# Patient Record
Sex: Male | Born: 1965 | Race: White | Hispanic: No | Marital: Married | Smoking: Never smoker
Health system: Southern US, Community
[De-identification: ages and names within clinical notes are randomized; demographics above are authoritative.]

## PROBLEM LIST (undated history)

## (undated) DIAGNOSIS — R531 Weakness: Secondary | ICD-10-CM

## (undated) DIAGNOSIS — R202 Paresthesia of skin: Secondary | ICD-10-CM

## (undated) DIAGNOSIS — E785 Hyperlipidemia, unspecified: Secondary | ICD-10-CM

## (undated) DIAGNOSIS — G500 Trigeminal neuralgia: Secondary | ICD-10-CM

## (undated) DIAGNOSIS — R002 Palpitations: Secondary | ICD-10-CM

## (undated) DIAGNOSIS — R519 Headache, unspecified: Secondary | ICD-10-CM

## (undated) DIAGNOSIS — I10 Essential (primary) hypertension: Secondary | ICD-10-CM

## (undated) DIAGNOSIS — R51 Headache: Secondary | ICD-10-CM

## (undated) DIAGNOSIS — R42 Dizziness and giddiness: Secondary | ICD-10-CM

## (undated) DIAGNOSIS — Z9989 Dependence on other enabling machines and devices: Secondary | ICD-10-CM

## (undated) DIAGNOSIS — Z9289 Personal history of other medical treatment: Secondary | ICD-10-CM

## (undated) DIAGNOSIS — G4733 Obstructive sleep apnea (adult) (pediatric): Secondary | ICD-10-CM

## (undated) DIAGNOSIS — E119 Type 2 diabetes mellitus without complications: Secondary | ICD-10-CM

## (undated) HISTORY — DX: Essential (primary) hypertension: I10

## (undated) HISTORY — DX: Weakness: R53.1

## (undated) HISTORY — DX: Paresthesia of skin: R20.2

## (undated) HISTORY — DX: Hyperlipidemia, unspecified: E78.5

## (undated) HISTORY — DX: Trigeminal neuralgia: G50.0

## (undated) HISTORY — DX: Headache: R51

## (undated) HISTORY — DX: Dizziness and giddiness: R42

## (undated) HISTORY — DX: Headache, unspecified: R51.9

## (undated) HISTORY — PX: NO PAST SURGERIES: SHX2092

---

## 2013-06-21 ENCOUNTER — Ambulatory Visit: Payer: BC Managed Care – PPO | Admitting: Neurology

## 2013-06-21 ENCOUNTER — Encounter: Payer: Self-pay | Admitting: Neurology

## 2013-06-21 DIAGNOSIS — R202 Paresthesia of skin: Secondary | ICD-10-CM | POA: Insufficient documentation

## 2013-06-21 DIAGNOSIS — R531 Weakness: Secondary | ICD-10-CM | POA: Insufficient documentation

## 2013-06-21 DIAGNOSIS — R42 Dizziness and giddiness: Secondary | ICD-10-CM | POA: Insufficient documentation

## 2013-06-21 DIAGNOSIS — G500 Trigeminal neuralgia: Secondary | ICD-10-CM | POA: Insufficient documentation

## 2015-10-13 DIAGNOSIS — G500 Trigeminal neuralgia: Secondary | ICD-10-CM | POA: Diagnosis not present

## 2015-10-13 DIAGNOSIS — I10 Essential (primary) hypertension: Secondary | ICD-10-CM | POA: Diagnosis not present

## 2015-10-13 DIAGNOSIS — R739 Hyperglycemia, unspecified: Secondary | ICD-10-CM | POA: Diagnosis not present

## 2015-10-13 DIAGNOSIS — K209 Esophagitis, unspecified: Secondary | ICD-10-CM | POA: Diagnosis not present

## 2016-02-12 DIAGNOSIS — Z23 Encounter for immunization: Secondary | ICD-10-CM | POA: Diagnosis not present

## 2016-02-18 DIAGNOSIS — G500 Trigeminal neuralgia: Secondary | ICD-10-CM | POA: Diagnosis not present

## 2016-02-18 DIAGNOSIS — Z87442 Personal history of urinary calculi: Secondary | ICD-10-CM | POA: Diagnosis not present

## 2016-02-18 DIAGNOSIS — N410 Acute prostatitis: Secondary | ICD-10-CM | POA: Diagnosis not present

## 2016-02-18 DIAGNOSIS — I10 Essential (primary) hypertension: Secondary | ICD-10-CM | POA: Diagnosis not present

## 2016-03-07 DIAGNOSIS — G4733 Obstructive sleep apnea (adult) (pediatric): Secondary | ICD-10-CM | POA: Diagnosis not present

## 2016-06-04 DIAGNOSIS — F17200 Nicotine dependence, unspecified, uncomplicated: Secondary | ICD-10-CM | POA: Diagnosis not present

## 2016-06-04 DIAGNOSIS — R0602 Shortness of breath: Secondary | ICD-10-CM | POA: Diagnosis not present

## 2016-06-04 DIAGNOSIS — Z7982 Long term (current) use of aspirin: Secondary | ICD-10-CM | POA: Diagnosis not present

## 2016-06-04 DIAGNOSIS — R05 Cough: Secondary | ICD-10-CM | POA: Diagnosis not present

## 2016-06-04 DIAGNOSIS — I10 Essential (primary) hypertension: Secondary | ICD-10-CM | POA: Diagnosis not present

## 2016-06-04 DIAGNOSIS — B349 Viral infection, unspecified: Secondary | ICD-10-CM | POA: Diagnosis not present

## 2016-07-04 DIAGNOSIS — G4733 Obstructive sleep apnea (adult) (pediatric): Secondary | ICD-10-CM | POA: Diagnosis not present

## 2016-08-05 DIAGNOSIS — Z0000 Encounter for general adult medical examination without abnormal findings: Secondary | ICD-10-CM | POA: Diagnosis not present

## 2016-10-26 DIAGNOSIS — M25512 Pain in left shoulder: Secondary | ICD-10-CM | POA: Diagnosis not present

## 2016-11-13 DIAGNOSIS — R000 Tachycardia, unspecified: Secondary | ICD-10-CM | POA: Diagnosis not present

## 2016-11-13 DIAGNOSIS — R0602 Shortness of breath: Secondary | ICD-10-CM | POA: Diagnosis not present

## 2016-11-13 DIAGNOSIS — E119 Type 2 diabetes mellitus without complications: Secondary | ICD-10-CM | POA: Diagnosis not present

## 2016-11-13 DIAGNOSIS — I10 Essential (primary) hypertension: Secondary | ICD-10-CM | POA: Diagnosis not present

## 2016-11-13 DIAGNOSIS — F419 Anxiety disorder, unspecified: Secondary | ICD-10-CM | POA: Diagnosis not present

## 2016-11-13 DIAGNOSIS — Z79899 Other long term (current) drug therapy: Secondary | ICD-10-CM | POA: Diagnosis not present

## 2016-11-13 DIAGNOSIS — R51 Headache: Secondary | ICD-10-CM | POA: Diagnosis not present

## 2016-11-13 DIAGNOSIS — Z87891 Personal history of nicotine dependence: Secondary | ICD-10-CM | POA: Diagnosis not present

## 2016-11-13 DIAGNOSIS — Z7982 Long term (current) use of aspirin: Secondary | ICD-10-CM | POA: Diagnosis not present

## 2016-12-06 DIAGNOSIS — G4733 Obstructive sleep apnea (adult) (pediatric): Secondary | ICD-10-CM | POA: Diagnosis not present

## 2017-02-13 DIAGNOSIS — Z23 Encounter for immunization: Secondary | ICD-10-CM | POA: Diagnosis not present

## 2017-03-06 DIAGNOSIS — L918 Other hypertrophic disorders of the skin: Secondary | ICD-10-CM | POA: Diagnosis not present

## 2017-03-06 DIAGNOSIS — D225 Melanocytic nevi of trunk: Secondary | ICD-10-CM | POA: Diagnosis not present

## 2017-03-06 DIAGNOSIS — L821 Other seborrheic keratosis: Secondary | ICD-10-CM | POA: Diagnosis not present

## 2017-03-27 DIAGNOSIS — G4733 Obstructive sleep apnea (adult) (pediatric): Secondary | ICD-10-CM | POA: Diagnosis not present

## 2017-03-29 ENCOUNTER — Ambulatory Visit (INDEPENDENT_AMBULATORY_CARE_PROVIDER_SITE_OTHER): Payer: BLUE CROSS/BLUE SHIELD | Admitting: Physician Assistant

## 2017-03-29 ENCOUNTER — Encounter: Payer: Self-pay | Admitting: Physician Assistant

## 2017-03-29 VITALS — BP 118/81 | HR 77 | Temp 97.8°F | Ht 73.0 in | Wt 311.8 lb

## 2017-03-29 DIAGNOSIS — E785 Hyperlipidemia, unspecified: Secondary | ICD-10-CM

## 2017-03-29 DIAGNOSIS — E1169 Type 2 diabetes mellitus with other specified complication: Secondary | ICD-10-CM | POA: Diagnosis not present

## 2017-03-29 DIAGNOSIS — Z0000 Encounter for general adult medical examination without abnormal findings: Secondary | ICD-10-CM

## 2017-03-29 DIAGNOSIS — G500 Trigeminal neuralgia: Secondary | ICD-10-CM | POA: Diagnosis not present

## 2017-03-29 DIAGNOSIS — Z87438 Personal history of other diseases of male genital organs: Secondary | ICD-10-CM | POA: Diagnosis not present

## 2017-03-29 DIAGNOSIS — I10 Essential (primary) hypertension: Secondary | ICD-10-CM

## 2017-03-29 DIAGNOSIS — K5730 Diverticulosis of large intestine without perforation or abscess without bleeding: Secondary | ICD-10-CM | POA: Diagnosis not present

## 2017-03-29 DIAGNOSIS — E119 Type 2 diabetes mellitus without complications: Secondary | ICD-10-CM | POA: Diagnosis not present

## 2017-03-29 DIAGNOSIS — E1159 Type 2 diabetes mellitus with other circulatory complications: Secondary | ICD-10-CM | POA: Diagnosis not present

## 2017-03-29 DIAGNOSIS — I152 Hypertension secondary to endocrine disorders: Secondary | ICD-10-CM | POA: Insufficient documentation

## 2017-03-29 MED ORDER — METFORMIN HCL 500 MG PO TABS: 500.0000 mg | ORAL_TABLET | Freq: Two times a day (BID) | ORAL | 5 refills | Status: DC

## 2017-03-29 MED ORDER — PANTOPRAZOLE SODIUM 40 MG PO TBEC: 40.0000 mg | DELAYED_RELEASE_TABLET | Freq: Every day | ORAL | 11 refills | Status: DC

## 2017-03-29 MED ORDER — OXCARBAZEPINE 600 MG PO TABS: 600.0000 mg | ORAL_TABLET | Freq: Three times a day (TID) | ORAL | 5 refills | Status: DC

## 2017-03-29 MED ORDER — TRAMADOL HCL 50 MG PO TABS: 50.0000 mg | ORAL_TABLET | Freq: Every day | ORAL | 1 refills | Status: DC

## 2017-03-29 MED ORDER — IBUPROFEN 800 MG PO TABS: 800.0000 mg | ORAL_TABLET | Freq: Every day | ORAL | 5 refills | Status: DC

## 2017-03-29 MED ORDER — ENALAPRIL MALEATE 20 MG PO TABS: 20.0000 mg | ORAL_TABLET | Freq: Every day | ORAL | 11 refills | Status: DC

## 2017-03-29 MED ORDER — EZETIMIBE 10 MG PO TABS: 10.0000 mg | ORAL_TABLET | Freq: Every day | ORAL | 11 refills | Status: DC

## 2017-03-29 MED ORDER — GABAPENTIN 300 MG PO CAPS: 300.0000 mg | ORAL_CAPSULE | Freq: Three times a day (TID) | ORAL | 11 refills | Status: DC

## 2017-03-29 NOTE — Progress Notes (Signed)
BP 118/81   Pulse 77   Temp 97.8 F (36.6 C) (Oral)   Ht 6\' 1"  (1.854 m)   Wt (!) 311 lb 12.8 oz (141.4 kg)   BMI 41.14 kg/m    Subjective:    Patient ID: Normal Gress, male    DOB: 08/06/1965, 51 y.o.   MRN: [id number]  HPI: Matthew Pennington is a 51 y.o. male presenting on 03/29/2017 for New Patient (Initial Visit) (Establish Care )  This patient comes in as a new patient to be established.  He has been seeing a PA in [place] with the last name of weight.  She is no longer at that practice.  He has family members that come here and wanted to establish here.  We reviewed all of his past medical history.  It is positive for trigeminal neuralgia, bursitis of the shoulder, recurrent back pain, history of prostatitis, diabetes, hyperlipidemia, diverticulosis, hypertension.  We will have labs drawn this week at his earliest convenience.  Orders are placed.  He reports having a colonoscopy approximately 5 years ago through a gastroenterologist in [place].  He reports that he was going to be able to wait 10 years.  Diverticulosis was found on that exam.  He is retired and used to work as a Midwife.  Over the years he has had lots of injuries.  His medications are reviewed and will be updated today.  Uses a limited amount of the tramadol.  He reports that the gabapentin does the best for his trigeminal neuralgia pain.  Relevant past medical, surgical, family and social history reviewed and updated as indicated. Allergies and medications reviewed and updated.  Past Medical History:  Diagnosis Date  . Headache   . Hyperlipidemia   . Hypertension   . Light headedness   . Tingling    toes  . Trigeminal neuralgia   . Trigeminal neuralgia   . Weakness     History reviewed. No pertinent surgical history.  Review of Systems  Constitutional: Negative.  Negative for appetite change, fatigue and fever.  HENT: Negative.   Eyes: Negative.  Negative for pain and visual disturbance.    Respiratory: Negative.  Negative for cough, chest tightness, shortness of breath and wheezing.   Cardiovascular: Negative.  Negative for chest pain, palpitations and leg swelling.  Gastrointestinal: Negative.  Negative for abdominal pain, diarrhea, nausea and vomiting.  Endocrine: Negative.   Genitourinary: Negative.   Musculoskeletal: Positive for arthralgias, back pain and myalgias.  Skin: Negative.  Negative for color change and rash.  Neurological: Positive for headaches. Negative for tremors, weakness and numbness.  Psychiatric/Behavioral: Negative.     Allergies as of 03/29/2017      Reactions   Morphine And Related       Medication List       Accurate as of 03/29/17  1:33 PM. Always use your most recent med list.          enalapril 20 MG tablet Commonly known as:  VASOTEC Take 1 tablet (20 mg total) by mouth daily.   ezetimibe 10 MG tablet Commonly known as:  ZETIA Take 1 tablet (10 mg total) by mouth daily.   gabapentin 300 MG capsule Commonly known as:  NEURONTIN Take 1 capsule (300 mg total) by mouth 3 (three) times daily.   ibuprofen 800 MG tablet Commonly known as:  ADVIL,MOTRIN Take 1 tablet (800 mg total) by mouth daily.   metFORMIN 500 MG tablet Commonly known as:  GLUCOPHAGE Take 1 tablet (  500 mg total) by mouth 2 (two) times daily.   oxcarbazepine 600 MG tablet Commonly known as:  TRILEPTAL Take 1 tablet (600 mg total) by mouth 3 (three) times daily.   pantoprazole 40 MG tablet Commonly known as:  PROTONIX Take 1 tablet (40 mg total) by mouth daily.   traMADol 50 MG tablet Commonly known as:  ULTRAM Take 1 tablet (50 mg total) by mouth daily.          Objective:    BP 118/81   Pulse 77   Temp 97.8 F (36.6 C) (Oral)   Ht 6\' 1"  (1.854 m)   Wt (!) 311 lb 12.8 oz (141.4 kg)   BMI 41.14 kg/m   Allergies  Allergen Reactions  . Morphine And Related     Physical Exam  Constitutional: He appears well-developed and well-nourished.  No distress.  HENT:  Head: Normocephalic and atraumatic.  Eyes: Pupils are equal, round, and reactive to light. Conjunctivae and EOM are normal.  Cardiovascular: Normal rate, regular rhythm and normal heart sounds.   Pulmonary/Chest: Effort normal and breath sounds normal. No respiratory distress.  Skin: Skin is warm and dry.  Psychiatric: He has a normal mood and affect. His behavior is normal.  Nursing note and vitals reviewed.   No results found for this or any previous visit.    Assessment & Plan:   1. Trigeminal neuralgia - traMADol (ULTRAM) 50 MG tablet; Take 1 tablet (50 mg total) by mouth daily.  Dispense: 60 tablet; Refill: 1 - oxcarbazepine (TRILEPTAL) 600 MG tablet; Take 1 tablet (600 mg total) by mouth 3 (three) times daily.  Dispense: 90 tablet; Refill: 5 - gabapentin (NEURONTIN) 300 MG capsule; Take 1 capsule (300 mg total) by mouth 3 (three) times daily.  Dispense: 30 capsule; Refill: 11 - ibuprofen (ADVIL,MOTRIN) 800 MG tablet; Take 1 tablet (800 mg total) by mouth daily.  Dispense: 90 tablet; Refill: 5  2. Hypertension associated with diabetes (HCC) - enalapril (VASOTEC) 20 MG tablet; Take 1 tablet (20 mg total) by mouth daily.  Dispense: 30 tablet; Refill: 11 - CBC with Differential/Platelet; Future  3. Diabetes mellitus without complication (HCC) - metFORMIN (GLUCOPHAGE) 500 MG tablet; Take 1 tablet (500 mg total) by mouth 2 (two) times daily.  Dispense: 60 tablet; Refill: 5 - CMP14+EGFR; Future - Bayer DCA Hb A1c Waived; Future  4. Hyperlipidemia associated with type 2 diabetes mellitus (HCC) - ezetimibe (ZETIA) 10 MG tablet; Take 1 tablet (10 mg total) by mouth daily.  Dispense: 30 tablet; Refill: 11 - CMP14+EGFR; Future - Lipid panel; Future  5. Well adult exam - CBC with Differential/Platelet; Future - CMP14+EGFR; Future - Lipid panel; Future - TSH; Future - PSA; Future  6. Diverticulosis of large intestine without hemorrhage  7. History of  prostatitis    Current Outpatient Prescriptions:  .  enalapril (VASOTEC) 20 MG tablet, Take 1 tablet (20 mg total) by mouth daily., Disp: 30 tablet, Rfl: 11 .  ezetimibe (ZETIA) 10 MG tablet, Take 1 tablet (10 mg total) by mouth daily., Disp: 30 tablet, Rfl: 11 .  gabapentin (NEURONTIN) 300 MG capsule, Take 1 capsule (300 mg total) by mouth 3 (three) times daily., Disp: 30 capsule, Rfl: 11 .  ibuprofen (ADVIL,MOTRIN) 800 MG tablet, Take 1 tablet (800 mg total) by mouth daily., Disp: 90 tablet, Rfl: 5 .  metFORMIN (GLUCOPHAGE) 500 MG tablet, Take 1 tablet (500 mg total) by mouth 2 (two) times daily., Disp: 60 tablet, Rfl: 5 .  oxcarbazepine (  TRILEPTAL) 600 MG tablet, Take 1 tablet (600 mg total) by mouth 3 (three) times daily., Disp: 90 tablet, Rfl: 5 .  pantoprazole (PROTONIX) 40 MG tablet, Take 1 tablet (40 mg total) by mouth daily., Disp: 30 tablet, Rfl: 11 .  traMADol (ULTRAM) 50 MG tablet, Take 1 tablet (50 mg total) by mouth daily., Disp: 60 tablet, Rfl: 1 Continue all other maintenance medications as listed above.  Follow up plan: Return in about 3 months (around 06/29/2017) for recheck.  Educational handout given for survey  Remus Loffler PA-C [facility] [address]  [place], [place] [address] [phone]   03/29/2017, 1:33 PM

## 2017-03-29 NOTE — Patient Instructions (Signed)

## 2017-03-31 ENCOUNTER — Other Ambulatory Visit: Payer: BLUE CROSS/BLUE SHIELD

## 2017-03-31 DIAGNOSIS — I152 Hypertension secondary to endocrine disorders: Secondary | ICD-10-CM

## 2017-03-31 DIAGNOSIS — E1159 Type 2 diabetes mellitus with other circulatory complications: Secondary | ICD-10-CM | POA: Diagnosis not present

## 2017-03-31 DIAGNOSIS — I10 Essential (primary) hypertension: Principal | ICD-10-CM

## 2017-03-31 DIAGNOSIS — Z0000 Encounter for general adult medical examination without abnormal findings: Secondary | ICD-10-CM

## 2017-03-31 DIAGNOSIS — E1169 Type 2 diabetes mellitus with other specified complication: Secondary | ICD-10-CM | POA: Diagnosis not present

## 2017-03-31 DIAGNOSIS — E785 Hyperlipidemia, unspecified: Secondary | ICD-10-CM

## 2017-03-31 DIAGNOSIS — E119 Type 2 diabetes mellitus without complications: Secondary | ICD-10-CM

## 2017-03-31 LAB — BAYER DCA HB A1C WAIVED: HB A1C (BAYER DCA - WAIVED): 8.7 % — ABNORMAL HIGH (ref <7.0)

## 2017-04-01 LAB — CMP14+EGFR
ALT: 78 IU/L — ABNORMAL HIGH (ref 0–44)
AST: 53 IU/L — ABNORMAL HIGH (ref 0–40)
Albumin/Globulin Ratio: 1.8 (ref 1.2–2.2)
Albumin: 4.4 g/dL (ref 3.5–5.5)
Alkaline Phosphatase: 65 IU/L (ref 39–117)
BUN/Creatinine Ratio: 11 (ref 9–20)
BUN: 9 mg/dL (ref 6–24)
Bilirubin Total: 0.3 mg/dL (ref 0.0–1.2)
CO2: 23 mmol/L (ref 20–29)
Calcium: 9.8 mg/dL (ref 8.7–10.2)
Chloride: 103 mmol/L (ref 96–106)
Creatinine, Ser: 0.82 mg/dL (ref 0.76–1.27)
GFR calc Af Amer: 118 mL/min/{1.73_m2} (ref >59)
GFR calc non Af Amer: 102 mL/min/{1.73_m2} (ref >59)
Globulin, Total: 2.4 g/dL (ref 1.5–4.5)
Glucose: 128 mg/dL — ABNORMAL HIGH (ref 65–99)
Potassium: 4.4 mmol/L (ref 3.5–5.2)
Sodium: 142 mmol/L (ref 134–144)
Total Protein: 6.8 g/dL (ref 6.0–8.5)

## 2017-04-01 LAB — CBC WITH DIFFERENTIAL/PLATELET
Basophils Absolute: 0 10*3/uL (ref 0.0–0.2)
Basos: 0 %
EOS (ABSOLUTE): 0.1 10*3/uL (ref 0.0–0.4)
Eos: 2 %
Hematocrit: 42.6 % (ref 37.5–51.0)
Hemoglobin: 14.5 g/dL (ref 13.0–17.7)
Immature Grans (Abs): 0 10*3/uL (ref 0.0–0.1)
Immature Granulocytes: 0 %
Lymphocytes Absolute: 2.8 10*3/uL (ref 0.7–3.1)
Lymphs: 43 %
MCH: 30.9 pg (ref 26.6–33.0)
MCHC: 34 g/dL (ref 31.5–35.7)
MCV: 91 fL (ref 79–97)
Monocytes Absolute: 0.5 10*3/uL (ref 0.1–0.9)
Monocytes: 7 %
Neutrophils Absolute: 3.1 10*3/uL (ref 1.4–7.0)
Neutrophils: 48 %
Platelets: 279 10*3/uL (ref 150–379)
RBC: 4.7 x10E6/uL (ref 4.14–5.80)
RDW: 14.2 % (ref 12.3–15.4)
WBC: 6.5 10*3/uL (ref 3.4–10.8)

## 2017-04-01 LAB — LIPID PANEL
Chol/HDL Ratio: 6.3 ratio — ABNORMAL HIGH (ref 0.0–5.0)
Cholesterol, Total: 202 mg/dL — ABNORMAL HIGH (ref 100–199)
HDL: 32 mg/dL — ABNORMAL LOW (ref >39)
LDL Calculated: 120 mg/dL — ABNORMAL HIGH (ref 0–99)
Triglycerides: 252 mg/dL — ABNORMAL HIGH (ref 0–149)
VLDL Cholesterol Cal: 50 mg/dL — ABNORMAL HIGH (ref 5–40)

## 2017-04-01 LAB — TSH: TSH: 1.59 u[IU]/mL (ref 0.450–4.500)

## 2017-04-01 LAB — PSA: Prostate Specific Ag, Serum: 0.4 ng/mL (ref 0.0–4.0)

## 2017-04-04 ENCOUNTER — Telehealth: Payer: Self-pay | Admitting: Physician Assistant

## 2017-04-04 ENCOUNTER — Other Ambulatory Visit: Payer: Self-pay | Admitting: *Deleted

## 2017-04-04 DIAGNOSIS — G500 Trigeminal neuralgia: Secondary | ICD-10-CM

## 2017-04-04 MED ORDER — GABAPENTIN 300 MG PO CAPS: 300.0000 mg | ORAL_CAPSULE | Freq: Three times a day (TID) | ORAL | 11 refills | Status: DC

## 2017-04-04 MED ORDER — DAPAGLIFLOZIN PROPANEDIOL 5 MG PO TABS: 5.0000 mg | ORAL_TABLET | Freq: Every day | ORAL | 5 refills | Status: DC

## 2017-04-04 NOTE — Telephone Encounter (Signed)
Corrected script sent

## 2017-04-04 NOTE — Telephone Encounter (Signed)
Pt aware.

## 2017-04-04 NOTE — Telephone Encounter (Signed)
Patient aware that script has been changed

## 2017-04-04 NOTE — Addendum Note (Signed)
Addended by: Terald Sleeper on: 04/04/2017 11:00 AM   Modules accepted: Orders

## 2017-04-04 NOTE — Telephone Encounter (Signed)
Patient called lab results and states that gabapentin prescription was only given for 10 days

## 2017-04-05 ENCOUNTER — Encounter: Payer: Self-pay | Admitting: Physician Assistant

## 2017-04-06 ENCOUNTER — Encounter: Payer: Self-pay | Admitting: Physician Assistant

## 2017-04-06 ENCOUNTER — Telehealth: Payer: Self-pay

## 2017-04-06 NOTE — Telephone Encounter (Signed)
Insurance denied [place]  Must try two alternatives which are Invok[employer] and [place]

## 2017-04-07 ENCOUNTER — Encounter: Payer: Self-pay | Admitting: Physician Assistant

## 2017-04-07 MED ORDER — EMPAGLIFLOZIN 25 MG PO TABS: 25.0000 mg | ORAL_TABLET | Freq: Every day | ORAL | 5 refills | Status: DC

## 2017-04-07 NOTE — Addendum Note (Signed)
Addended by: Terald Sleeper on: 04/07/2017 08:04 AM   Modules accepted: Orders

## 2017-04-14 ENCOUNTER — Encounter: Payer: Self-pay | Admitting: Physician Assistant

## 2017-04-14 DIAGNOSIS — I10 Essential (primary) hypertension: Principal | ICD-10-CM

## 2017-04-14 DIAGNOSIS — I152 Hypertension secondary to endocrine disorders: Secondary | ICD-10-CM

## 2017-04-14 DIAGNOSIS — E1159 Type 2 diabetes mellitus with other circulatory complications: Secondary | ICD-10-CM

## 2017-04-14 MED ORDER — ENALAPRIL MALEATE 20 MG PO TABS: 20.0000 mg | ORAL_TABLET | Freq: Two times a day (BID) | ORAL | 11 refills | Status: DC

## 2017-04-18 ENCOUNTER — Encounter: Payer: Self-pay | Admitting: Physician Assistant

## 2017-07-01 DIAGNOSIS — Z7982 Long term (current) use of aspirin: Secondary | ICD-10-CM | POA: Diagnosis not present

## 2017-07-01 DIAGNOSIS — I10 Essential (primary) hypertension: Secondary | ICD-10-CM | POA: Diagnosis not present

## 2017-07-01 DIAGNOSIS — R0602 Shortness of breath: Secondary | ICD-10-CM | POA: Diagnosis not present

## 2017-07-01 DIAGNOSIS — Z79899 Other long term (current) drug therapy: Secondary | ICD-10-CM | POA: Diagnosis not present

## 2017-07-01 DIAGNOSIS — G4730 Sleep apnea, unspecified: Secondary | ICD-10-CM | POA: Diagnosis not present

## 2017-07-01 DIAGNOSIS — Z87891 Personal history of nicotine dependence: Secondary | ICD-10-CM | POA: Diagnosis not present

## 2017-07-01 DIAGNOSIS — Z7984 Long term (current) use of oral hypoglycemic drugs: Secondary | ICD-10-CM | POA: Diagnosis not present

## 2017-07-01 DIAGNOSIS — R002 Palpitations: Secondary | ICD-10-CM | POA: Diagnosis not present

## 2017-07-03 ENCOUNTER — Ambulatory Visit: Payer: BLUE CROSS/BLUE SHIELD | Admitting: Physician Assistant

## 2017-07-03 ENCOUNTER — Encounter: Payer: Self-pay | Admitting: Physician Assistant

## 2017-07-03 VITALS — BP 113/75 | HR 71 | Temp 97.1°F | Ht 73.0 in | Wt 308.0 lb

## 2017-07-03 DIAGNOSIS — R002 Palpitations: Secondary | ICD-10-CM

## 2017-07-03 DIAGNOSIS — E119 Type 2 diabetes mellitus without complications: Secondary | ICD-10-CM

## 2017-07-03 DIAGNOSIS — G500 Trigeminal neuralgia: Secondary | ICD-10-CM | POA: Diagnosis not present

## 2017-07-03 DIAGNOSIS — M21961 Unspecified acquired deformity of right lower leg: Secondary | ICD-10-CM

## 2017-07-03 DIAGNOSIS — M21962 Unspecified acquired deformity of left lower leg: Secondary | ICD-10-CM

## 2017-07-03 DIAGNOSIS — E1140 Type 2 diabetes mellitus with diabetic neuropathy, unspecified: Secondary | ICD-10-CM | POA: Diagnosis not present

## 2017-07-03 LAB — LIPID PANEL
Chol/HDL Ratio: 5.5 ratio — ABNORMAL HIGH (ref 0.0–5.0)
Cholesterol, Total: 193 mg/dL (ref 100–199)
HDL: 35 mg/dL — ABNORMAL LOW (ref >39)
LDL Calculated: 124 mg/dL — ABNORMAL HIGH (ref 0–99)
Triglycerides: 171 mg/dL — ABNORMAL HIGH (ref 0–149)
VLDL Cholesterol Cal: 34 mg/dL (ref 5–40)

## 2017-07-03 LAB — BAYER DCA HB A1C WAIVED: HB A1C (BAYER DCA - WAIVED): 6.8 % (ref <7.0)

## 2017-07-03 MED ORDER — EMPAGLIFLOZIN 25 MG PO TABS: 25.0000 mg | ORAL_TABLET | Freq: Every day | ORAL | 5 refills | Status: DC

## 2017-07-03 MED ORDER — TRAMADOL HCL 50 MG PO TABS: 50.0000 mg | ORAL_TABLET | Freq: Every day | ORAL | 1 refills | Status: DC

## 2017-07-03 MED ORDER — METFORMIN HCL 500 MG PO TABS: 500.0000 mg | ORAL_TABLET | Freq: Two times a day (BID) | ORAL | 5 refills | Status: DC

## 2017-07-03 MED ORDER — IBUPROFEN 800 MG PO TABS: 800.0000 mg | ORAL_TABLET | Freq: Every day | ORAL | 5 refills | Status: DC

## 2017-07-03 MED ORDER — OXCARBAZEPINE 600 MG PO TABS: 600.0000 mg | ORAL_TABLET | Freq: Three times a day (TID) | ORAL | 5 refills | Status: DC

## 2017-07-03 NOTE — Patient Instructions (Signed)
In a few days you may receive a survey in the mail or online from [employer] regarding your visit with us today. Please take a moment to fill this out. Your feedback is very important to our whole office. It can help us better understand your needs as well as improve your experience and satisfaction. Thank you for taking your time to complete it. We care about you.  Calyse Murcia, PA-C  

## 2017-07-03 NOTE — Progress Notes (Signed)
BP 113/75   Pulse 71   Temp (!) 97.1 F (36.2 C) (Oral)   Ht 6\' 1"  (1.854 m)   Wt (!) 308 lb (139.7 kg)   BMI 40.64 kg/m    Subjective:    Patient ID: Matthew Pennington, male    DOB: Oct 16, 1965, 52 y.o.   MRN: [id number]  HPI: Matthew Pennington is a 52 y.o. male presenting on 07/03/2017 for Follow-up (3 month ); Referral (for Diabetic eye exam ); Foot Pain (bilateral pain and tingling ); and Palpitations (went to ER Saturday for palpitations- [facility] )  .  Palpitations have become more frequent.  He did go to the emergency room on Saturday.  He has not had a cardiac workup in some time.  He does have some family history of cardiac disease and he is a diabetic and self.  We will plan for cardiac evaluation  His fasting blood sugars have ranged anywhere from the low 100s-140.  He has been having very good control of that.  We did discuss the possibility of the anxiety also causing the palpitations.  I think at this point it sounds as though he is having palpitations first and then gets anxious.  He is also here for face-to-face for diabetic shoes.  He has no known deformity no deformity on the right posterior heel.  He has decreased sensation in the second third and fourth toes on the left and the right.  He has thickened nails throughout.  He also has a history of ingrown nails.  Relevant past medical, surgical, family and social history reviewed and updated as indicated. Allergies and medications reviewed and updated.  Past Medical History:  Diagnosis Date  . Headache   . Hyperlipidemia   . Hypertension   . Light headedness   . Tingling    toes  . Trigeminal neuralgia   . Trigeminal neuralgia   . Weakness     History reviewed. No pertinent surgical history.  Review of Systems  Constitutional: Negative.  Negative for appetite change and fatigue.  HENT: Negative.   Eyes: Negative.  Negative for pain and visual disturbance.  Respiratory: Negative.  Negative for cough, chest  tightness, shortness of breath and wheezing.   Cardiovascular: Negative.  Negative for chest pain, palpitations and leg swelling.  Gastrointestinal: Negative.  Negative for abdominal pain, diarrhea, nausea and vomiting.  Endocrine: Negative.   Genitourinary: Negative.   Musculoskeletal: Negative.   Skin: Negative.  Negative for color change and rash.  Neurological: Negative.  Negative for weakness, numbness and headaches.  Psychiatric/Behavioral: Negative.     Allergies as of 07/03/2017      Reactions   Morphine And Related       Medication List        Accurate as of 07/03/17  2:44 PM. Always use your most recent med list.          empagliflozin 25 MG Tabs tablet Commonly known as:  JARDIANCE Take 25 mg by mouth daily.   enalapril 20 MG tablet Commonly known as:  VASOTEC Take 1 tablet (20 mg total) 2 (two) times daily by mouth.   ezetimibe 10 MG tablet Commonly known as:  ZETIA Take 1 tablet (10 mg total) by mouth daily.   gabapentin 300 MG capsule Commonly known as:  NEURONTIN Take 1 capsule (300 mg total) 3 (three) times daily by mouth.   ibuprofen 800 MG tablet Commonly known as:  ADVIL,MOTRIN Take 1 tablet (800 mg total) by mouth daily.  metFORMIN 500 MG tablet Commonly known as:  GLUCOPHAGE Take 1 tablet (500 mg total) by mouth 2 (two) times daily.   oxcarbazepine 600 MG tablet Commonly known as:  TRILEPTAL Take 1 tablet (600 mg total) by mouth 3 (three) times daily.   pantoprazole 40 MG tablet Commonly known as:  PROTONIX Take 1 tablet (40 mg total) by mouth daily.   traMADol 50 MG tablet Commonly known as:  ULTRAM Take 1 tablet (50 mg total) by mouth daily.            Durable Medical Equipment  (From admission, onward)        Start     Ordered   07/03/17 0000  DME Other see comment    Comments:  Please fit for diabetic shoes Diagnosis code E 11.40   07/03/17 1353         Objective:    BP 113/75   Pulse 71   Temp (!) 97.1 F (36.2  C) (Oral)   Ht 6\' 1"  (1.854 m)   Wt (!) 308 lb (139.7 kg)   BMI 40.64 kg/m   Allergies  Allergen Reactions  . Morphine And Related     Physical Exam  Constitutional: He appears well-developed and well-nourished.  HENT:  Head: Normocephalic and atraumatic.  Eyes: Conjunctivae and EOM are normal. Pupils are equal, round, and reactive to light.  Neck: Normal range of motion. Neck supple.  Cardiovascular: Normal rate, regular rhythm and normal heart sounds.  Pulmonary/Chest: Effort normal and breath sounds normal.  Abdominal: Soft. Bowel sounds are normal.  Musculoskeletal: Normal range of motion.  Skin: Skin is warm and dry.   Diabetic Foot Exam - Simple   Simple Foot Form Diabetic Foot exam was performed with the following findings:  Yes 07/03/2017  2:44 PM  Visual Inspection Sensation Testing Pulse Check Comments   Diabetic Foot Exam - detailed  Date & Time: 07/03/2017 2:44 PM Diabetic Foot exam was performed with the following findings: Yes  Visual Foot Exam completed.: Yes  Is there a history of foot ulcer?: No  Is there a foot ulcer now?: No  Is there swelling?: Yes  Is there elevated skin temperature?: No  Is there abnormal foot shape?: Yes  Is there a claw toe deformity?: No  Are the toenails long?: No  Are the toenails thick?: Yes  Are the toenails ingrown?: Yes  Is the skin thin, fragile, shiny and hairless?": No  Normal Range of Motion?: Yes  Is there foot or ankle muscle weakness?: No  Do you have pain in calf while walking?: No  Are the shoes appropriate in style and fit?: Yes  Can the patient see the bottom of their feet?: Yes  Right Posterior Tibialis: Diminished Left posterior Tibialis: Diminished  Right Dorsalis Pedis: Diminished Left Dorsalis Pedis: Diminished     Semmes-Weinstein Monofilament Test  "+" means "has sensation" and "-" means "no sensation"  R Foot Test Control: Pos L Foot Test Control: Pos  R Site 1-Great Toe: Pos L Site 1-Great Toe: Pos      Results for orders placed or performed in visit on 07/03/17  Bayer DCA Hb A1c Waived  Result Value Ref Range   Bayer DCA Hb A1c Waived 6.8 <7.0 %      Assessment & Plan:   1. Palpitations - Ambulatory referral to Cardiology  2. Trigeminal neuralgia - oxcarbazepine (TRILEPTAL) 600 MG tablet; Take 1 tablet (600 mg total) by mouth 3 (three) times daily.  Dispense: 90  tablet; Refill: 5 - ibuprofen (ADVIL,MOTRIN) 800 MG tablet; Take 1 tablet (800 mg total) by mouth daily.  Dispense: 90 tablet; Refill: 5 - traMADol (ULTRAM) 50 MG tablet; Take 1 tablet (50 mg total) by mouth daily.  Dispense: 60 tablet; Refill: 1  3. Diabetes mellitus without complication (HCC) - Bayer DCA Hb A1c Waived - metFORMIN (GLUCOPHAGE) 500 MG tablet; Take 1 tablet (500 mg total) by mouth 2 (two) times daily.  Dispense: 60 tablet; Refill: 5 - Lipid panel  4. Type 2 diabetes mellitus with diabetic neuropathy, without long-term current use of insulin (HCC) - Ambulatory referral to Ophthalmology - DME Other see comment  5. Foot deformity, bilateral - DME Other see comment    Current Outpatient Medications:  .  empagliflozin (JARDIANCE) 25 MG TABS tablet, Take 25 mg by mouth daily., Disp: 30 tablet, Rfl: 5 .  enalapril (VASOTEC) 20 MG tablet, Take 1 tablet (20 mg total) 2 (two) times daily by mouth., Disp: 60 tablet, Rfl: 11 .  ezetimibe (ZETIA) 10 MG tablet, Take 1 tablet (10 mg total) by mouth daily., Disp: 30 tablet, Rfl: 11 .  gabapentin (NEURONTIN) 300 MG capsule, Take 1 capsule (300 mg total) 3 (three) times daily by mouth., Disp: 90 capsule, Rfl: 11 .  ibuprofen (ADVIL,MOTRIN) 800 MG tablet, Take 1 tablet (800 mg total) by mouth daily., Disp: 90 tablet, Rfl: 5 .  metFORMIN (GLUCOPHAGE) 500 MG tablet, Take 1 tablet (500 mg total) by mouth 2 (two) times daily., Disp: 60 tablet, Rfl: 5 .  oxcarbazepine (TRILEPTAL) 600 MG tablet, Take 1 tablet (600 mg total) by mouth 3 (three) times daily., Disp: 90  tablet, Rfl: 5 .  pantoprazole (PROTONIX) 40 MG tablet, Take 1 tablet (40 mg total) by mouth daily., Disp: 30 tablet, Rfl: 11 .  traMADol (ULTRAM) 50 MG tablet, Take 1 tablet (50 mg total) by mouth daily., Disp: 60 tablet, Rfl: 1 Continue all other maintenance medications as listed above.  Follow up plan: Return in about 6 months (around 12/31/2017) for re check.  Educational handout given for survey   Remus Loffler PA-C [facility] [address]  [place], [place] [address] [phone]   07/03/2017, 2:44 PM

## 2017-07-14 DIAGNOSIS — E119 Type 2 diabetes mellitus without complications: Secondary | ICD-10-CM | POA: Diagnosis not present

## 2017-07-14 DIAGNOSIS — H3581 Retinal edema: Secondary | ICD-10-CM | POA: Diagnosis not present

## 2017-07-14 DIAGNOSIS — H2513 Age-related nuclear cataract, bilateral: Secondary | ICD-10-CM | POA: Diagnosis not present

## 2017-07-20 ENCOUNTER — Ambulatory Visit: Payer: Self-pay | Admitting: Cardiovascular Disease

## 2017-07-25 ENCOUNTER — Other Ambulatory Visit: Payer: Self-pay | Admitting: Physician Assistant

## 2017-08-07 ENCOUNTER — Ambulatory Visit: Payer: Self-pay | Admitting: Cardiovascular Disease

## 2017-08-08 DIAGNOSIS — G4733 Obstructive sleep apnea (adult) (pediatric): Secondary | ICD-10-CM | POA: Diagnosis not present

## 2017-08-25 ENCOUNTER — Encounter: Payer: Self-pay | Admitting: *Deleted

## 2017-08-28 ENCOUNTER — Telehealth: Payer: Self-pay | Admitting: Cardiovascular Disease

## 2017-08-28 ENCOUNTER — Encounter: Payer: Self-pay | Admitting: *Deleted

## 2017-08-28 ENCOUNTER — Encounter: Payer: Self-pay | Admitting: Cardiovascular Disease

## 2017-08-28 ENCOUNTER — Ambulatory Visit: Payer: BLUE CROSS/BLUE SHIELD | Admitting: Cardiovascular Disease

## 2017-08-28 VITALS — BP 120/82 | HR 68 | Ht 73.0 in | Wt 307.4 lb

## 2017-08-28 DIAGNOSIS — E782 Mixed hyperlipidemia: Secondary | ICD-10-CM | POA: Diagnosis not present

## 2017-08-28 DIAGNOSIS — R002 Palpitations: Secondary | ICD-10-CM

## 2017-08-28 DIAGNOSIS — E119 Type 2 diabetes mellitus without complications: Secondary | ICD-10-CM

## 2017-08-28 DIAGNOSIS — I10 Essential (primary) hypertension: Secondary | ICD-10-CM | POA: Diagnosis not present

## 2017-08-28 NOTE — Patient Instructions (Signed)
Medication Instructions:  Your physician recommends that you continue on your current medications as directed. Please refer to the Current Medication list given to you today.  Labwork: NONE  Testing/Procedures: Your physician has requested that you have an echocardiogram. Echocardiography is a painless test that uses sound waves to create images of your heart. It provides your doctor with information about the size and shape of your heart and how well your heart's chambers and valves are working. This procedure takes approximately one hour. There are no restrictions for this procedure.  Your physician has recommended that you wear an event monitor FOR 30 DAYS. Event monitors are medical devices that record the heart's electrical activity. Doctors most often [place] these monitors to diagnose arrhythmias. Arrhythmias are problems with the speed or rhythm of the heartbeat. The monitor is a small, portable device. You can wear one while you do your normal daily activities. This is usually used to diagnose what is causing palpitations/syncope (passing out).   Follow-Up: Your physician recommends that you schedule a follow-up appointment in: [address]   Any Other Special Instructions Will Be Listed Below (If Applicable).  If you need a refill on your cardiac medications before your next appointment, please call your pharmacy.

## 2017-08-28 NOTE — Progress Notes (Signed)
CARDIOLOGY CONSULT NOTE  Patient ID: Matthew Pennington MRN: [id number] DOB/AGE: 07/07/1965 52 y.o.  Admit date: (Not on file) Primary Physician: Remus Loffler, PA-C Referring Physician: Remus Loffler, PA-C  Reason for Consultation: Palpitations  HPI: Matthew Pennington is a 52 y.o. male who is being seen today for the evaluation of palpitations at the request of Remus Loffler, PA-C.   Past medical history includes type 2 diabetes mellitus, mixed dyslipidemia and hypertension.  I reviewed lipid panel performed 07/03/17 and 03/31/17.  The more recent lipid panel shows total cholesterol 193, triglycerides 171, HDL 35, LDL 124.  TSH normal at 1.59 on 03/31/17.  It appears he was recently evaluated for palpitations in the ED at [facility]  I do not have these records.  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  More recently in the past year, he has been experiencing episodes of a warm sensation all over his body while driving followed by his heart racing.  He said he unnecessarily feels excited and jittery at times.  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  He is able to walk with his wife in his neighborhood without exertional chest pain or dyspnea.  He denies leg swelling, orthopnea, and paroxysmal nocturnal dyspnea.  He has sleep apnea and uses CPAP.  Family history: Mother developed CHF in her 68s.  He has a sister who underwent valve replacement surgery and had rheumatic fever as a child.    Allergies  Allergen Reactions  . Morphine And Related Other (See Comments)    "Makes me go Crazy"    Current Outpatient Medications  Medication Sig Dispense Refill  . empagliflozin (JARDIANCE) 25 MG TABS tablet Take 25 mg by mouth daily. 30 tablet 5  . enalapril (VASOTEC) 20 MG tablet Take 1 tablet (20 mg total) 2 (two) times daily by  mouth. 60 tablet 11  . ezetimibe (ZETIA) 10 MG tablet Take 1 tablet (10 mg total) by mouth daily. 30 tablet 11  . gabapentin (NEURONTIN) 300 MG capsule Take 1 capsule (300 mg total) 3 (three) times daily by mouth. 90 capsule 11  . ibuprofen (ADVIL,MOTRIN) 800 MG tablet Take 1 tablet (800 mg total) by mouth daily. 90 tablet 5  . metFORMIN (GLUCOPHAGE) 500 MG tablet Take 1 tablet (500 mg total) by mouth 2 (two) times daily. 60 tablet 5  . oxcarbazepine (TRILEPTAL) 600 MG tablet Take 1 tablet (600 mg total) by mouth 3 (three) times daily. 90 tablet 5  . pantoprazole (PROTONIX) 40 MG tablet Take 1 tablet (40 mg total) by mouth daily. 30 tablet 11  . traMADol (ULTRAM) 50 MG tablet Take 1 tablet (50 mg total) by mouth daily. 60 tablet 1   No current facility-administered medications for this visit.     Past Medical History:  Diagnosis Date  . Headache   . Hyperlipidemia   . Hypertension   . Light headedness   . Tingling    toes  . Trigeminal neuralgia   . Trigeminal neuralgia   . Weakness     Past Surgical History:  Procedure Laterality Date  . NO PAST SURGERIES      Social History   Socioeconomic History  . Marital status: Married    Spouse name: Not on file  . Number of  children: Not on file  . Years of education: Not on file  . Highest education level: Not on file  Occupational History  . Not on file  Social Needs  . Financial resource strain: Not on file  . Food insecurity:    Worry: Not on file    Inability: Not on file  . Transportation needs:    Medical: Not on file    Non-medical: Not on file  Tobacco Use  . Smoking status: Never Smoker  . Smokeless tobacco: Never Used  Substance and Sexual Activity  . Alcohol use: No  . Drug use: No  . Sexual activity: Not on file  Lifestyle  . Physical activity:    Days per week: Not on file    Minutes per session: Not on file  . Stress: Not on file  Relationships  . Social connections:    Talks on phone: Not on file      Gets together: Not on file    Attends religious service: Not on file    Active member of club or organization: Not on file    Attends meetings of clubs or organizations: Not on file    Relationship status: Not on file  . Intimate partner violence:    Fear of current or ex partner: Not on file    Emotionally abused: Not on file    Physically abused: Not on file    Forced sexual activity: Not on file  Other Topics Concern  . Not on file  Social History Narrative   Patient works full time as a Midwife.     Current Meds  Medication Sig  . empagliflozin (JARDIANCE) 25 MG TABS tablet Take 25 mg by mouth daily.  . enalapril (VASOTEC) 20 MG tablet Take 1 tablet (20 mg total) 2 (two) times daily by mouth.  . ezetimibe (ZETIA) 10 MG tablet Take 1 tablet (10 mg total) by mouth daily.  Marland Kitchen gabapentin (NEURONTIN) 300 MG capsule Take 1 capsule (300 mg total) 3 (three) times daily by mouth.  Marland Kitchen ibuprofen (ADVIL,MOTRIN) 800 MG tablet Take 1 tablet (800 mg total) by mouth daily.  . metFORMIN (GLUCOPHAGE) 500 MG tablet Take 1 tablet (500 mg total) by mouth 2 (two) times daily.  Marland Kitchen oxcarbazepine (TRILEPTAL) 600 MG tablet Take 1 tablet (600 mg total) by mouth 3 (three) times daily.  . pantoprazole (PROTONIX) 40 MG tablet Take 1 tablet (40 mg total) by mouth daily.  . traMADol (ULTRAM) 50 MG tablet Take 1 tablet (50 mg total) by mouth daily.      Review of systems complete and found to be negative unless listed above in HPI    Physical exam Blood pressure 120/82, pulse 68, height 6\' 1"  (1.854 m), weight (!) 307 lb 6.4 oz (139.4 kg), SpO2 95 %. General: NAD Neck: No JVD, no thyromegaly or thyroid nodule.  Lungs: Clear to auscultation bilaterally with normal respiratory effort. CV: Nondisplaced PMI. Regular rate and rhythm, normal S1/S2, no S3/S4, no murmur.  No peripheral edema.  No carotid bruit.  Abdomen: Soft, nontender, no distention.  Skin: Intact without lesions or rashes.   Neurologic: Alert and oriented x 3.  Psych: Normal affect. Extremities: No clubbing or cyanosis.  HEENT: Normal.   ECG: Most recent ECG reviewed.   Labs: Lab Results  Component Value Date/Time   K 4.4 03/31/2017 11:08 AM   BUN 9 03/31/2017 11:08 AM   CREATININE 0.82 03/31/2017 11:08 AM   ALT 78 (H) 03/31/2017 11:08 AM  TSH 1.590 03/31/2017 11:08 AM   HGB 14.5 03/31/2017 11:08 AM     Lipids: Lab Results  Component Value Date/Time   LDLCALC 124 (H) 07/03/2017 10:26 AM   CHOL 193 07/03/2017 10:26 AM   TRIG 171 (H) 07/03/2017 10:26 AM   HDL 35 (L) 07/03/2017 10:26 AM        ASSESSMENT AND PLAN:   1.  Palpitations: It is unclear to me if symptoms are related to an arrhythmia versus an anxiety/panic disorder.  I will obtain records from [facility] including ECGs.  I will obtain an echocardiogram to evaluate cardiac structure and function.  I will obtain a 30-day event monitor as well.  2.  Hypertension: Controlled on present therapy.  No changes.  3.  Type 2 diabetes mellitus: Currently on Jardiance and metformin.  4.  Mixed dyslipidemia: Lipids reviewed above.  Currently on Zetia 10 mg.  Statin therapy is indicated given concomitant type 2 diabetes mellitus.  I would recommend starting Crestor 10 mg.    Disposition: Follow up in 2 months  Signed: Prentice Docker, M.D., F.A.C.C.  08/28/2017, 9:30 AM

## 2017-08-28 NOTE — Telephone Encounter (Signed)
Echo [facility] September 19, 2017

## 2017-08-31 ENCOUNTER — Ambulatory Visit (INDEPENDENT_AMBULATORY_CARE_PROVIDER_SITE_OTHER): Payer: BLUE CROSS/BLUE SHIELD

## 2017-08-31 DIAGNOSIS — R002 Palpitations: Secondary | ICD-10-CM | POA: Diagnosis not present

## 2017-09-05 ENCOUNTER — Encounter: Payer: Self-pay | Admitting: *Deleted

## 2017-09-16 ENCOUNTER — Encounter: Payer: Self-pay | Admitting: Physician Assistant

## 2017-09-20 ENCOUNTER — Other Ambulatory Visit: Payer: Self-pay | Admitting: Cardiovascular Disease

## 2017-09-20 ENCOUNTER — Other Ambulatory Visit: Payer: BLUE CROSS/BLUE SHIELD | Admitting: Cardiovascular Disease

## 2017-09-20 DIAGNOSIS — R002 Palpitations: Secondary | ICD-10-CM

## 2017-09-20 DIAGNOSIS — R0602 Shortness of breath: Secondary | ICD-10-CM

## 2017-09-27 DIAGNOSIS — Z9289 Personal history of other medical treatment: Secondary | ICD-10-CM

## 2017-09-27 HISTORY — DX: Personal history of other medical treatment: Z92.89

## 2017-10-11 ENCOUNTER — Telehealth: Payer: Self-pay | Admitting: *Deleted

## 2017-10-11 ENCOUNTER — Other Ambulatory Visit: Payer: Self-pay

## 2017-10-11 ENCOUNTER — Ambulatory Visit (INDEPENDENT_AMBULATORY_CARE_PROVIDER_SITE_OTHER): Payer: BLUE CROSS/BLUE SHIELD

## 2017-10-11 DIAGNOSIS — R0602 Shortness of breath: Secondary | ICD-10-CM

## 2017-10-11 DIAGNOSIS — R002 Palpitations: Secondary | ICD-10-CM

## 2017-10-11 NOTE — Telephone Encounter (Signed)
Notes recorded by Laurine Blazer, LPN on 0/37/0488 at 8:91 PM EDT Patient notified. Copy to pmd. Follow up scheduled for 11/02/2017 with Dr. Bronson Ing.    ------  Notes recorded by Herminio Commons, MD on 10/11/2017 at 10:25 AM EDT Normal cardiac function.

## 2017-10-19 DIAGNOSIS — G4733 Obstructive sleep apnea (adult) (pediatric): Secondary | ICD-10-CM | POA: Diagnosis not present

## 2017-11-02 ENCOUNTER — Telehealth: Payer: Self-pay | Admitting: *Deleted

## 2017-11-02 ENCOUNTER — Ambulatory Visit: Payer: BLUE CROSS/BLUE SHIELD | Admitting: Cardiovascular Disease

## 2017-11-02 NOTE — Telephone Encounter (Signed)
Patient questioned if caffeine could make his heart rate be elevated.  Explained to patient that definitely caffeine can, along with stress, exercise, sugar, chocolate.  Stated that he does drink a lot of coffee and soda and states the he definitely will try cutting back on that.  Suggest he could keep log of readings and bring with him to next OV.  If heart rate continue to trend upward before next OV he is to contact the office.  Next OV scheduled for August.

## 2017-11-02 NOTE — Telephone Encounter (Signed)
Notes recorded by Laurine Blazer, LPN on 09/27/8341 at 7:35 AM EDT Patient notified. Copy to pmd. Follow up scheduled for August with Dr. Bronson Ing. ------  Notes recorded by Laurine Blazer, LPN on 7/89/7847 at 8:41 PM EDT Left message to return call.  ------  Notes recorded by Herminio Commons, MD on 10/27/2017 at 12:33 PM EDT Sinus rhythm and sinus tachycardia seen. Symptoms correlated with sinus tachycardia, 114 bpm

## 2017-11-16 ENCOUNTER — Other Ambulatory Visit: Payer: Self-pay | Admitting: Physician Assistant

## 2017-11-16 ENCOUNTER — Encounter: Payer: Self-pay | Admitting: Physician Assistant

## 2017-11-16 NOTE — Telephone Encounter (Signed)
Last office visit February 4,2019. Please advise on refill.

## 2017-11-17 ENCOUNTER — Other Ambulatory Visit: Payer: Self-pay | Admitting: Physician Assistant

## 2017-11-17 DIAGNOSIS — E119 Type 2 diabetes mellitus without complications: Secondary | ICD-10-CM

## 2017-11-17 MED ORDER — EMPAGLIFLOZIN 25 MG PO TABS: 25.0000 mg | ORAL_TABLET | Freq: Every day | ORAL | 5 refills | Status: DC

## 2017-11-17 MED ORDER — METFORMIN HCL 500 MG PO TABS: 500.0000 mg | ORAL_TABLET | Freq: Two times a day (BID) | ORAL | 5 refills | Status: DC

## 2017-11-17 NOTE — Telephone Encounter (Signed)
I sent medicaition and can be seen in the next couple of months

## 2017-11-17 NOTE — Telephone Encounter (Signed)
Patient aware.

## 2017-11-22 ENCOUNTER — Ambulatory Visit: Payer: BLUE CROSS/BLUE SHIELD | Admitting: Physician Assistant

## 2017-11-22 ENCOUNTER — Encounter: Payer: Self-pay | Admitting: Physician Assistant

## 2017-11-22 VITALS — BP 120/80 | HR 82 | Temp 96.6°F | Ht 73.0 in | Wt 303.6 lb

## 2017-11-22 DIAGNOSIS — G4762 Sleep related leg cramps: Secondary | ICD-10-CM

## 2017-11-22 DIAGNOSIS — F411 Generalized anxiety disorder: Secondary | ICD-10-CM | POA: Diagnosis not present

## 2017-11-22 MED ORDER — DULOXETINE HCL 30 MG PO CPEP: 30.0000 mg | ORAL_CAPSULE | Freq: Every day | ORAL | 3 refills | Status: DC

## 2017-11-22 MED ORDER — ROPINIROLE HCL 0.25 MG PO TABS: 0.2500 mg | ORAL_TABLET | Freq: Every day | ORAL | 2 refills | Status: DC

## 2017-11-22 NOTE — Progress Notes (Signed)
BP 120/80   Pulse 82   Temp (!) 96.6 F (35.9 C) (Oral)   Ht 6\' 1"  (1.854 m)   Wt (!) 303 lb 9.6 oz (137.7 kg)   BMI 40.06 kg/m    Subjective:    Patient ID: Matthew Pennington, male    DOB: [birth year], 52 y.o.   MRN: [id number]  HPI: [name] is a 52 y.o. male presenting on 11/22/2017 for Anxiety and leg cramps  Has had a lot of anxiety for some time.    Feeling nervous, anxious or on edge 3 Not being able to stop or control worrying 3 Worrying too much about different things 3 Trouble relaxing 3 Being so restless that it is hard to sit still 3 Becoming easily annoyed or irritable 3 Feeling afraid as if something awful might happen 0        Total Score 18  He has also leg cramp especially at night.  He feels better when standing up. But the pain is nightly.  Past Medical History:  Diagnosis Date  . Headache   . Hyperlipidemia   . Hypertension   . Light headedness   . Tingling    toes  . Trigeminal neuralgia   . Trigeminal neuralgia   . Weakness    Relevant past medical, surgical, family and social history reviewed and updated as indicated. Interim medical history since our last visit reviewed. Allergies and medications reviewed and updated. DATA REVIEWED: CHART IN EPIC  Family History reviewed for pertinent findings.  Review of Systems  Constitutional: Negative.  Negative for appetite change and fatigue.  HENT: Negative.   Eyes: Negative.  Negative for pain and visual disturbance.  Respiratory: Negative.  Negative for cough, chest tightness, shortness of breath and wheezing.   Cardiovascular: Negative.  Negative for chest pain, palpitations and leg swelling.  Gastrointestinal: Negative.  Negative for abdominal pain, diarrhea, nausea and vomiting.  Endocrine: Negative.   Genitourinary: Negative.   Musculoskeletal: Negative.   Skin: Negative.  Negative for color change and rash.  Neurological: Positive for weakness. Negative for numbness and headaches.    Psychiatric/Behavioral: Negative.     Allergies as of 11/22/2017      Reactions   Morphine And Related Other (See Comments)   "Makes me go Crazy"      Medication List        Accurate as of 11/22/17 11:59 PM. Always use your most recent med list.          DULoxetine 30 MG capsule Commonly known as:  CYMBALTA Take 1-2 capsules (30-60 mg total) by mouth daily.   empagliflozin 25 MG Tabs tablet Commonly known as:  JARDIANCE Take 25 mg by mouth daily.   enalapril 20 MG tablet Commonly known as:  VASOTEC Take 1 tablet (20 mg total) 2 (two) times daily by mouth.   ezetimibe 10 MG tablet Commonly known as:  ZETIA Take 1 tablet (10 mg total) by mouth daily.   gabapentin 300 MG capsule Commonly known as:  NEURONTIN Take 1 capsule (300 mg total) 3 (three) times daily by mouth.   ibuprofen 800 MG tablet Commonly known as:  ADVIL,MOTRIN Take 1 tablet (800 mg total) by mouth daily.   metFORMIN 500 MG tablet Commonly known as:  GLUCOPHAGE Take 1 tablet (500 mg total) by mouth 2 (two) times daily.   oxcarbazepine 600 MG tablet Commonly known as:  TRILEPTAL Take 1 tablet (600 mg total) by mouth 3 (three) times daily.  pantoprazole 40 MG tablet Commonly known as:  PROTONIX Take 1 tablet (40 mg total) by mouth daily.   rOPINIRole 0.25 MG tablet Commonly known as:  REQUIP Take 1-2 tablets (0.25-0.5 mg total) by mouth at bedtime. Restless legs   traMADol 50 MG tablet Commonly known as:  ULTRAM Take 1 tablet (50 mg total) by mouth daily.          Objective:    BP 120/80   Pulse 82   Temp (!) 96.6 F (35.9 C) (Oral)   Ht 6\' 1"  (1.854 m)   Wt (!) 303 lb 9.6 oz (137.7 kg)   BMI 40.06 kg/m   Allergies  Allergen Reactions  . Morphine And Related Other (See Comments)    "Makes me go Crazy"    Wt Readings from Last 3 Encounters:  11/23/17 300 lb (136.1 kg)  11/22/17 (!) 303 lb 9.6 oz (137.7 kg)  08/28/17 (!) 307 lb 6.4 oz (139.4 kg)    Physical Exam   Constitutional: He appears well-developed and well-nourished. No distress.  HENT:  Head: Normocephalic and atraumatic.  Eyes: Pupils are equal, round, and reactive to light. Conjunctivae and EOM are normal.  Cardiovascular: Normal rate, regular rhythm and normal heart sounds.  Pulmonary/Chest: Effort normal and breath sounds normal. No respiratory distress.  Skin: Skin is warm and dry.  Psychiatric: He has a normal mood and affect. His behavior is normal.  Nursing note and vitals reviewed.   Results for orders placed or performed in visit on 07/03/17  Bayer DCA Hb A1c Waived  Result Value Ref Range   HB A1C (BAYER DCA - WAIVED) 6.8 <7.0 %  Lipid panel  Result Value Ref Range   Cholesterol, Total 193 100 - 199 mg/dL   Triglycerides 782 (H) 0 - 149 mg/dL   HDL 35 (L) >95 mg/dL   VLDL Cholesterol Cal 34 5 - 40 mg/dL   LDL Calculated 621 (H) 0 - 99 mg/dL   Chol/HDL Ratio 5.5 (H) 0.0 - 5.0 ratio      Assessment & Plan:   1. GAD (generalized anxiety disorder) - DULoxetine (CYMBALTA) 30 MG capsule; Take 1-2 capsules (30-60 mg total) by mouth daily.  Dispense: 60 capsule; Refill: 3  2. Leg cramps, sleep related - rOPINIRole (REQUIP) 0.25 MG tablet; Take 1-2 tablets (0.25-0.5 mg total) by mouth at bedtime. Restless legs  Dispense: 60 tablet; Refill: 2   Continue all other maintenance medications as listed above.  Follow up plan: Recheck 4 weeks  Educational GAD  Remus Loffler PA-C [facility] [address]  [place], [place] [address] [phone]   11/24/2017, 9:01 AM

## 2017-11-23 ENCOUNTER — Encounter (INDEPENDENT_AMBULATORY_CARE_PROVIDER_SITE_OTHER): Payer: Self-pay | Admitting: Orthopaedic Surgery

## 2017-11-23 ENCOUNTER — Ambulatory Visit (INDEPENDENT_AMBULATORY_CARE_PROVIDER_SITE_OTHER): Payer: BLUE CROSS/BLUE SHIELD | Admitting: Orthopaedic Surgery

## 2017-11-23 ENCOUNTER — Ambulatory Visit (INDEPENDENT_AMBULATORY_CARE_PROVIDER_SITE_OTHER): Payer: Self-pay

## 2017-11-23 VITALS — BP 138/97 | HR 91 | Ht 73.0 in | Wt 300.0 lb

## 2017-11-23 DIAGNOSIS — M545 Low back pain: Secondary | ICD-10-CM | POA: Diagnosis not present

## 2017-11-23 DIAGNOSIS — G8929 Other chronic pain: Secondary | ICD-10-CM | POA: Diagnosis not present
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
## 2017-11-24 DIAGNOSIS — G4762 Sleep related leg cramps: Secondary | ICD-10-CM | POA: Insufficient documentation

## 2017-11-24 DIAGNOSIS — F411 Generalized anxiety disorder: Secondary | ICD-10-CM | POA: Insufficient documentation

## 2017-11-24 DIAGNOSIS — G4733 Obstructive sleep apnea (adult) (pediatric): Secondary | ICD-10-CM | POA: Diagnosis not present

## 2017-11-27 ENCOUNTER — Encounter (INDEPENDENT_AMBULATORY_CARE_PROVIDER_SITE_OTHER): Payer: Self-pay | Admitting: Orthopaedic Surgery

## 2017-11-28 DIAGNOSIS — M545 Low back pain: Secondary | ICD-10-CM | POA: Diagnosis not present

## 2017-12-01 DIAGNOSIS — M545 Low back pain: Secondary | ICD-10-CM | POA: Diagnosis not present

## 2017-12-02 ENCOUNTER — Encounter: Payer: Self-pay | Admitting: Physician Assistant

## 2017-12-05 ENCOUNTER — Other Ambulatory Visit: Payer: Self-pay | Admitting: Physician Assistant

## 2017-12-05 DIAGNOSIS — M545 Low back pain: Secondary | ICD-10-CM | POA: Diagnosis not present

## 2017-12-05 DIAGNOSIS — G4733 Obstructive sleep apnea (adult) (pediatric): Secondary | ICD-10-CM | POA: Insufficient documentation

## 2017-12-08 ENCOUNTER — Other Ambulatory Visit: Payer: Self-pay | Admitting: Physician Assistant

## 2017-12-08 DIAGNOSIS — G500 Trigeminal neuralgia: Secondary | ICD-10-CM

## 2017-12-08 DIAGNOSIS — M545 Low back pain: Secondary | ICD-10-CM | POA: Diagnosis not present

## 2017-12-13 ENCOUNTER — Other Ambulatory Visit: Payer: Self-pay | Admitting: Physician Assistant

## 2017-12-13 DIAGNOSIS — G500 Trigeminal neuralgia: Secondary | ICD-10-CM

## 2017-12-14 DIAGNOSIS — M545 Low back pain: Secondary | ICD-10-CM | POA: Diagnosis not present

## 2017-12-19 DIAGNOSIS — M545 Low back pain: Secondary | ICD-10-CM | POA: Diagnosis not present

## 2017-12-21 ENCOUNTER — Encounter: Payer: Self-pay | Admitting: Physician Assistant

## 2017-12-21 ENCOUNTER — Ambulatory Visit (INDEPENDENT_AMBULATORY_CARE_PROVIDER_SITE_OTHER): Payer: BLUE CROSS/BLUE SHIELD | Admitting: Orthopaedic Surgery

## 2017-12-21 ENCOUNTER — Encounter (INDEPENDENT_AMBULATORY_CARE_PROVIDER_SITE_OTHER): Payer: Self-pay | Admitting: Orthopaedic Surgery

## 2017-12-21 ENCOUNTER — Other Ambulatory Visit: Payer: Self-pay | Admitting: Physician Assistant

## 2017-12-21 VITALS — BP 118/72 | HR 87 | Ht 73.0 in | Wt 295.0 lb

## 2017-12-21 DIAGNOSIS — M5126 Other intervertebral disc displacement, lumbar region: Secondary | ICD-10-CM

## 2017-12-21 DIAGNOSIS — G500 Trigeminal neuralgia: Secondary | ICD-10-CM

## 2017-12-21 MED ORDER — TRAMADOL HCL 50 MG PO TABS: 50.0000 mg | ORAL_TABLET | Freq: Every day | ORAL | 1 refills | Status: DC

## 2017-12-21 NOTE — Progress Notes (Signed)
Office Visit Note   Patient: Matthew Pennington           Date of Birth: May 12, 1966           MRN: [id number] Visit Date: 12/21/2017              Requested by: Remus Loffler, PA-C [address] [place], [place] [address] PCP: Remus Loffler, PA-C   Assessment & Plan: Visit Diagnoses:  1. Protrusion of lumbar intervertebral disc     Plan: We will proceed with my scan lumbar compression with his persistent radicular symptoms and positive nerve root tension signs and uncontrolled pain.  He has diabetes and we did not use a prednisone pack.  Patient return after lumbar MRI scan.  Past severe problems with claustrophobia despite Valium we will send him for an open scanner Valium prescription given.  Follow-Up Instructions: return after lumbar MRI  Orders:  No orders of the defined types were placed in this encounter.  No orders of the defined types were placed in this encounter.     Procedures: No procedures performed   Clinical Data: No additional findings.   Subjective: Chief Complaint  Patient presents with  . Lower Back - Pain    HPI 52 year old male returns with ongoing problems with back pain left leg pain that radiates down to his calf.  Pain is worse with sitting.  Increased pain with coughing or sneezing.  Minimal symptoms in the right leg.  No bowel bladder symptoms no fever or chills.  Has had 5 weeks of physical therapy without improvement.  I personally reviewed his progress with therapist and also reviewed the therapy report..  TENS unit gave him slight improvement.  States for 6 weeks now is not had any time in the day when he is comfortable.  He is used anti-inflammatories  And tramadol.  Therapy recommended MRI scan due to his pain not responsive to therapy treatment as well as other modalities.  Review of Systems 14 point review of systems updated unchanged from 11/23/2017 office visit.   Objective: Vital Signs: BP 118/72   Pulse 87   Ht 6\' 1"  (1.854 m)   Wt 295  lb (133.8 kg)   BMI 38.92 kg/m   Physical Exam  Constitutional: He is oriented to person, place, and time. He appears well-developed and well-nourished.  HENT:  Head: Normocephalic and atraumatic.  Eyes: Pupils are equal, round, and reactive to light. EOM are normal.  Neck: No tracheal deviation present. No thyromegaly present.  Cardiovascular: Normal rate.  Pulmonary/Chest: Effort normal. He has no wheezes.  Abdominal: Soft. Bowel sounds are normal.  Neurological: He is alert and oriented to person, place, and time.  Skin: Skin is warm and dry. Capillary refill takes less than 2 seconds.  Psychiatric: He has a normal mood and affect. His behavior is normal. Judgment and thought content normal.    Ortho Exam patient moves slowly from sitting to standing.  Straight leg raising 80 degrees on the left negative on the right positive popliteal compression test.  Increased pain with dorsiflexion on the left.  Distal pulses are 2+.  No pitting edema.  Tib gastrocsoleus is strong.  No quad weakness.  No hip flexion weakness.  Test negative negative logroll to the hips.  Specialty Comments:  No specialty comments available.  Imaging: No results found.   PMFS History: Patient Active Problem List   Diagnosis Date Noted  . OSA (obstructive sleep apnea) 12/05/2017  . GAD (generalized  anxiety disorder) 11/24/2017  . Leg cramps, sleep related 11/24/2017  . Hypertension   . Hypertension associated with diabetes (HCC) 03/29/2017  . Diabetes mellitus without complication (HCC) 03/29/2017  . Hyperlipidemia associated with type 2 diabetes mellitus (HCC) 03/29/2017  . Diverticulosis of large intestine without hemorrhage 03/29/2017  . History of prostatitis 03/29/2017  . Light headedness   . Tingling   . Weakness   . Trigeminal neuralgia    Past Medical History:  Diagnosis Date  . Headache   . Hyperlipidemia   . Hypertension   . Light headedness   . Tingling    toes  . Trigeminal  neuralgia   . Trigeminal neuralgia   . Weakness     Family History  Problem Relation Age of Onset  . Diabetes Mother   . Hyperlipidemia Mother   . Hypertension Mother   . Cancer Father        lung  . Heart disease Sister     Past Surgical History:  Procedure Laterality Date  . NO PAST SURGERIES     Social History   Occupational History  . Not on file  Tobacco Use  . Smoking status: Never Smoker  . Smokeless tobacco: Never Used  Substance and Sexual Activity  . Alcohol use: No  . Drug use: No  . Sexual activity: Not on file

## 2017-12-21 NOTE — Addendum Note (Signed)
Addended by: Meyer Cory on: 12/21/2017 10:44 AM   Modules accepted: Orders

## 2017-12-26 ENCOUNTER — Encounter (INDEPENDENT_AMBULATORY_CARE_PROVIDER_SITE_OTHER): Payer: Self-pay | Admitting: *Deleted

## 2017-12-28 ENCOUNTER — Ambulatory Visit (INDEPENDENT_AMBULATORY_CARE_PROVIDER_SITE_OTHER): Payer: BLUE CROSS/BLUE SHIELD | Admitting: Orthopaedic Surgery

## 2017-12-29 ENCOUNTER — Other Ambulatory Visit: Payer: BLUE CROSS/BLUE SHIELD

## 2017-12-29 ENCOUNTER — Encounter: Payer: BLUE CROSS/BLUE SHIELD | Admitting: Physician Assistant

## 2018-01-01 ENCOUNTER — Inpatient Hospital Stay: Admission: RE | Admit: 2018-01-01 | Payer: BLUE CROSS/BLUE SHIELD | Source: Ambulatory Visit

## 2018-01-08 ENCOUNTER — Encounter: Payer: Self-pay | Admitting: Physician Assistant

## 2018-01-12 DIAGNOSIS — M545 Low back pain: Secondary | ICD-10-CM | POA: Diagnosis not present

## 2018-01-12 DIAGNOSIS — M5126 Other intervertebral disc displacement, lumbar region: Secondary | ICD-10-CM | POA: Diagnosis not present

## 2018-01-15 ENCOUNTER — Encounter: Payer: BLUE CROSS/BLUE SHIELD | Admitting: Physician Assistant

## 2018-01-22 ENCOUNTER — Ambulatory Visit: Payer: BLUE CROSS/BLUE SHIELD | Admitting: Cardiovascular Disease

## 2018-01-26 DIAGNOSIS — G4733 Obstructive sleep apnea (adult) (pediatric): Secondary | ICD-10-CM | POA: Diagnosis not present

## 2018-02-12 DIAGNOSIS — M5126 Other intervertebral disc displacement, lumbar region: Secondary | ICD-10-CM | POA: Diagnosis not present

## 2018-02-20 DIAGNOSIS — Z23 Encounter for immunization: Secondary | ICD-10-CM | POA: Diagnosis not present

## 2018-02-21 ENCOUNTER — Telehealth (INDEPENDENT_AMBULATORY_CARE_PROVIDER_SITE_OTHER): Payer: Self-pay | Admitting: Orthopaedic Surgery

## 2018-02-21 NOTE — Telephone Encounter (Signed)
Debbie at [employer] called to get order for TENS unit faxed to her, as well as patients insurance cards, OV note pertaining to why patient received TENS unit/ diagnosis. Her phone # [phone] ext [phone]  Fax # [phone]

## 2018-02-23 NOTE — Telephone Encounter (Signed)
faxed

## 2018-02-26 DIAGNOSIS — G4733 Obstructive sleep apnea (adult) (pediatric): Secondary | ICD-10-CM | POA: Diagnosis not present

## 2018-03-07 DIAGNOSIS — R079 Chest pain, unspecified: Secondary | ICD-10-CM | POA: Diagnosis not present

## 2018-03-07 DIAGNOSIS — E119 Type 2 diabetes mellitus without complications: Secondary | ICD-10-CM | POA: Diagnosis not present

## 2018-03-07 DIAGNOSIS — Z7984 Long term (current) use of oral hypoglycemic drugs: Secondary | ICD-10-CM | POA: Diagnosis not present

## 2018-03-07 DIAGNOSIS — Z87891 Personal history of nicotine dependence: Secondary | ICD-10-CM | POA: Diagnosis not present

## 2018-03-07 DIAGNOSIS — Z79899 Other long term (current) drug therapy: Secondary | ICD-10-CM | POA: Diagnosis not present

## 2018-03-07 DIAGNOSIS — Z5321 Procedure and treatment not carried out due to patient leaving prior to being seen by health care provider: Secondary | ICD-10-CM | POA: Diagnosis not present

## 2018-03-07 DIAGNOSIS — I10 Essential (primary) hypertension: Secondary | ICD-10-CM | POA: Diagnosis not present

## 2018-03-08 ENCOUNTER — Encounter: Payer: BLUE CROSS/BLUE SHIELD | Admitting: Physician Assistant

## 2018-03-14 DIAGNOSIS — M5126 Other intervertebral disc displacement, lumbar region: Secondary | ICD-10-CM | POA: Diagnosis not present

## 2018-03-15 DIAGNOSIS — G4733 Obstructive sleep apnea (adult) (pediatric): Secondary | ICD-10-CM | POA: Diagnosis not present

## 2018-03-28 DIAGNOSIS — G4733 Obstructive sleep apnea (adult) (pediatric): Secondary | ICD-10-CM | POA: Diagnosis not present

## 2018-03-30 ENCOUNTER — Encounter

## 2018-03-30 ENCOUNTER — Ambulatory Visit: Payer: BLUE CROSS/BLUE SHIELD | Admitting: Cardiovascular Disease

## 2018-04-02 ENCOUNTER — Other Ambulatory Visit: Payer: Self-pay | Admitting: *Deleted

## 2018-04-02 MED ORDER — PANTOPRAZOLE SODIUM 40 MG PO TBEC: 40.0000 mg | DELAYED_RELEASE_TABLET | Freq: Every day | ORAL | 0 refills | Status: DC

## 2018-04-05 ENCOUNTER — Encounter: Payer: Self-pay | Admitting: Cardiovascular Disease

## 2018-04-05 ENCOUNTER — Ambulatory Visit (INDEPENDENT_AMBULATORY_CARE_PROVIDER_SITE_OTHER): Payer: BLUE CROSS/BLUE SHIELD | Admitting: Cardiovascular Disease

## 2018-04-05 ENCOUNTER — Encounter: Payer: Self-pay | Admitting: Physician Assistant

## 2018-04-05 ENCOUNTER — Ambulatory Visit (INDEPENDENT_AMBULATORY_CARE_PROVIDER_SITE_OTHER): Payer: BLUE CROSS/BLUE SHIELD | Admitting: Physician Assistant

## 2018-04-05 VITALS — BP 121/77 | HR 83 | Temp 97.0°F | Ht 73.0 in | Wt 299.0 lb

## 2018-04-05 VITALS — BP 130/68 | HR 73 | Ht 73.0 in | Wt 300.0 lb

## 2018-04-05 DIAGNOSIS — E782 Mixed hyperlipidemia: Secondary | ICD-10-CM

## 2018-04-05 DIAGNOSIS — Z0000 Encounter for general adult medical examination without abnormal findings: Secondary | ICD-10-CM

## 2018-04-05 DIAGNOSIS — G500 Trigeminal neuralgia: Secondary | ICD-10-CM

## 2018-04-05 DIAGNOSIS — E119 Type 2 diabetes mellitus without complications: Secondary | ICD-10-CM | POA: Diagnosis not present

## 2018-04-05 DIAGNOSIS — I10 Essential (primary) hypertension: Secondary | ICD-10-CM

## 2018-04-05 DIAGNOSIS — R002 Palpitations: Secondary | ICD-10-CM | POA: Diagnosis not present

## 2018-04-05 MED ORDER — BUPROPION HCL ER (XL) 150 MG PO TB24: 150.0000 mg | ORAL_TABLET | Freq: Every day | ORAL | 5 refills | Status: DC

## 2018-04-05 MED ORDER — TRAMADOL HCL 50 MG PO TABS: 50.0000 mg | ORAL_TABLET | Freq: Every day | ORAL | 5 refills | Status: DC

## 2018-04-05 NOTE — Progress Notes (Signed)
SUBJECTIVE: The patient returns for follow-up after undergoing cardiovascular testing performed for the evaluation of palpitations.  Echocardiogram on 10/11/2017 demonstrated normal cardiac function, LVEF 60 to 65%.  Event monitoring demonstrated sinus rhythm and sinus tachycardia with symptoms correlating with sinus tachycardia with a heart rate of 114 bpm.  Since his last visit with me he denies any exertional chest pain and dyspnea and denies palpitations.  He continues to modify his diet and has lost 30 pounds since the beginning of summer.  He has reduced caffeine intake and now drinks 1 cup of coffee daily.     Review of Systems: As per "subjective", otherwise negative.  Allergies  Allergen Reactions  . Morphine And Related Other (See Comments)    "Makes me go Crazy"    Current Outpatient Medications  Medication Sig Dispense Refill  . DULoxetine (CYMBALTA) 30 MG capsule Take 1-2 capsules (30-60 mg total) by mouth daily. 60 capsule 3  . empagliflozin (JARDIANCE) 25 MG TABS tablet Take 25 mg by mouth daily. 30 tablet 5  . enalapril (VASOTEC) 20 MG tablet Take 1 tablet (20 mg total) 2 (two) times daily by mouth. 60 tablet 11  . ezetimibe (ZETIA) 10 MG tablet Take 1 tablet (10 mg total) by mouth daily. 30 tablet 11  . gabapentin (NEURONTIN) 300 MG capsule Take 1 capsule (300 mg total) 3 (three) times daily by mouth. 90 capsule 11  . ibuprofen (ADVIL,MOTRIN) 800 MG tablet Take 1 tablet (800 mg total) by mouth daily. 90 tablet 5  . metFORMIN (GLUCOPHAGE) 500 MG tablet Take 1 tablet (500 mg total) by mouth 2 (two) times daily. 60 tablet 5  . oxcarbazepine (TRILEPTAL) 600 MG tablet Take 1 tablet (600 mg total) by mouth 3 (three) times daily. 90 tablet 5  . pantoprazole (PROTONIX) 40 MG tablet Take 1 tablet (40 mg total) by mouth daily. 30 tablet 0  . rOPINIRole (REQUIP) 0.25 MG tablet Take 1-2 tablets (0.25-0.5 mg total) by mouth at bedtime. Restless legs 60 tablet 2  .  traMADol (ULTRAM) 50 MG tablet Take 1 tablet (50 mg total) by mouth daily. 60 tablet 1   No current facility-administered medications for this visit.     Past Medical History:  Diagnosis Date  . Headache   . Hyperlipidemia   . Hypertension   . Light headedness   . Tingling    toes  . Trigeminal neuralgia   . Trigeminal neuralgia   . Weakness     Past Surgical History:  Procedure Laterality Date  . NO PAST SURGERIES      Social History   Socioeconomic History  . Marital status: Married    Spouse name: Not on file  . Number of children: Not on file  . Years of education: Not on file  . Highest education level: Not on file  Occupational History  . Not on file  Social Needs  . Financial resource strain: Not on file  . Food insecurity:    Worry: Not on file    Inability: Not on file  . Transportation needs:    Medical: Not on file    Non-medical: Not on file  Tobacco Use  . Smoking status: Never Smoker  . Smokeless tobacco: Never Used  Substance and Sexual Activity  . Alcohol use: No  . Drug use: No  . Sexual activity: Not on file  Lifestyle  . Physical activity:    Days per week: Not on file    Minutes  per session: Not on file  . Stress: Not on file  Relationships  . Social connections:    Talks on phone: Not on file    Gets together: Not on file    Attends religious service: Not on file    Active member of club or organization: Not on file    Attends meetings of clubs or organizations: Not on file    Relationship status: Not on file  . Intimate partner violence:    Fear of current or ex partner: Not on file    Emotionally abused: Not on file    Physically abused: Not on file    Forced sexual activity: Not on file  Other Topics Concern  . Not on file  Social History Narrative   Patient works full time as a Quarry manager.     Vitals:   04/05/18 0821  BP: 130/68  Pulse: 73  SpO2: 96%  Weight: 300 lb (136.1 kg)  Height: 6\' 1"  (1.854 m)    Wt  Readings from Last 3 Encounters:  04/05/18 300 lb (136.1 kg)  12/21/17 295 lb (133.8 kg)  11/23/17 300 lb (136.1 kg)     PHYSICAL EXAM General: NAD HEENT: Normal. Neck: No JVD, no thyromegaly. Lungs: Clear to auscultation bilaterally with normal respiratory effort. CV: Regular rate and rhythm, normal S1/S2, no S3/S4, no murmur. No pretibial or periankle edema.  No carotid bruit.   Abdomen: Soft, nontender, no distention.  Neurologic: Alert and oriented.  Psych: Normal affect. Skin: Normal. Musculoskeletal: No gross deformities.    ECG: Reviewed above under Subjective   Labs: Lab Results  Component Value Date/Time   K 4.4 03/31/2017 11:08 AM   BUN 9 03/31/2017 11:08 AM   CREATININE 0.82 03/31/2017 11:08 AM   ALT 78 (H) 03/31/2017 11:08 AM   TSH 1.590 03/31/2017 11:08 AM   HGB 14.5 03/31/2017 11:08 AM     Lipids: Lab Results  Component Value Date/Time   LDLCALC 124 (H) 07/03/2017 10:26 AM   CHOL 193 07/03/2017 10:26 AM   TRIG 171 (H) 07/03/2017 10:26 AM   HDL 35 (L) 07/03/2017 10:26 AM       ASSESSMENT AND PLAN: 1.  Palpitations: Echocardiogram and event monitoring results detailed above which were both unremarkable.  No further cardiac testing is indicated at this time.  No medications are necessary.  2.  Hypertension: Controlled on present therapy.  No changes.  3. Type 2 diabetes mellitus: Currently on Jardiance and metformin.  4.  Mixed dyslipidemia: Lipids reviewed above.  Currently on Zetia 10 mg.  Statin therapy is indicated given concomitant type 2 diabetes mellitus.  I would recommend starting Crestor 10 mg.    Disposition: Follow up as needed.   Kate Sable, M.D., F.A.C.C.

## 2018-04-05 NOTE — Patient Instructions (Signed)
Medication Instructions:  Continue all current medications.  Labwork: none  Testing/Procedures: none  Follow-Up: As needed.    Any Other Special Instructions Will Be Listed Below (If Applicable).  If you need a refill on your cardiac medications before your next appointment, please call your pharmacy.  

## 2018-04-10 ENCOUNTER — Other Ambulatory Visit: Payer: Self-pay | Admitting: Physician Assistant

## 2018-04-10 DIAGNOSIS — E785 Hyperlipidemia, unspecified: Secondary | ICD-10-CM

## 2018-04-10 DIAGNOSIS — I152 Hypertension secondary to endocrine disorders: Secondary | ICD-10-CM

## 2018-04-10 DIAGNOSIS — E1159 Type 2 diabetes mellitus with other circulatory complications: Secondary | ICD-10-CM

## 2018-04-10 DIAGNOSIS — G4762 Sleep related leg cramps: Secondary | ICD-10-CM

## 2018-04-10 DIAGNOSIS — E1169 Type 2 diabetes mellitus with other specified complication: Secondary | ICD-10-CM

## 2018-04-10 DIAGNOSIS — I10 Essential (primary) hypertension: Secondary | ICD-10-CM

## 2018-04-10 DIAGNOSIS — G500 Trigeminal neuralgia: Secondary | ICD-10-CM

## 2018-04-10 NOTE — Progress Notes (Signed)
BP 121/77   Pulse 83   Temp (!) 97 F (36.1 C) (Oral)   Ht 6\' 1"  (1.854 m)   Wt 299 lb (135.6 kg)   BMI 39.45 kg/m    Subjective:    Patient ID: Matthew Pennington, male    DOB: 1965/07/14, 52 y.o.   MRN: [id number]  HPI: Pryor Revilla is a 52 y.o. male presenting on 04/05/2018 for Annual Exam  This patient comes in for annual well physical examination. All medications are reviewed today. There are no reports of any problems with the medications. All of the medical conditions are reviewed and updated.  Lab work is reviewed and will be ordered as medically necessary. There are no new problems reported with today's visit.  Patient reports doing well overall.   Past Medical History:  Diagnosis Date  . Headache   . Hyperlipidemia   . Hypertension   . Light headedness   . Tingling    toes  . Trigeminal neuralgia   . Trigeminal neuralgia   . Weakness    Relevant past medical, surgical, family and social history reviewed and updated as indicated. Interim medical history since our last visit reviewed. Allergies and medications reviewed and updated. DATA REVIEWED: CHART IN EPIC  Family History reviewed for pertinent findings.  Review of Systems  Constitutional: Positive for fatigue. Negative for appetite change.  HENT: Negative.   Eyes: Negative.  Negative for pain and visual disturbance.  Respiratory: Negative.  Negative for cough, chest tightness, shortness of breath and wheezing.   Cardiovascular: Negative.  Negative for chest pain, palpitations and leg swelling.  Gastrointestinal: Negative.  Negative for abdominal pain, diarrhea, nausea and vomiting.  Endocrine: Negative.   Genitourinary: Negative.   Musculoskeletal: Positive for arthralgias and back pain.  Skin: Negative.  Negative for color change and rash.  Neurological: Positive for tremors, weakness, numbness and headaches.  Psychiatric/Behavioral: Negative.     Allergies as of 04/05/2018      Reactions   Morphine And  Related Other (See Comments)   "Makes me go Crazy"      Medication List        Accurate as of 04/05/18 11:59 PM. Always use your most recent med list.          buPROPion 150 MG 24 hr tablet Commonly known as:  WELLBUTRIN XL Take 1-2 tablets (150-300 mg total) by mouth daily.   empagliflozin 25 MG Tabs tablet Commonly known as:  JARDIANCE Take 25 mg by mouth daily.   enalapril 20 MG tablet Commonly known as:  VASOTEC Take 1 tablet (20 mg total) 2 (two) times daily by mouth.   ezetimibe 10 MG tablet Commonly known as:  ZETIA Take 1 tablet (10 mg total) by mouth daily.   gabapentin 300 MG capsule Commonly known as:  NEURONTIN Take 1 capsule (300 mg total) 3 (three) times daily by mouth.   ibuprofen 800 MG tablet Commonly known as:  ADVIL,MOTRIN Take 1 tablet (800 mg total) by mouth daily.   metFORMIN 500 MG tablet Commonly known as:  GLUCOPHAGE Take 1 tablet (500 mg total) by mouth 2 (two) times daily.   oxcarbazepine 600 MG tablet Commonly known as:  TRILEPTAL Take 1 tablet (600 mg total) by mouth 3 (three) times daily.   pantoprazole 40 MG tablet Commonly known as:  PROTONIX Take 1 tablet (40 mg total) by mouth daily.   rOPINIRole 0.25 MG tablet Commonly known as:  REQUIP Take 1-2 tablets (0.25-0.5 mg total) by mouth  at bedtime. Restless legs   traMADol 50 MG tablet Commonly known as:  ULTRAM Take 1 tablet (50 mg total) by mouth daily.          Objective:    BP 121/77   Pulse 83   Temp (!) 97 F (36.1 C) (Oral)   Ht 6\' 1"  (1.854 m)   Wt 299 lb (135.6 kg)   BMI 39.45 kg/m   Allergies  Allergen Reactions  . Morphine And Related Other (See Comments)    "Makes me go Crazy"    Wt Readings from Last 3 Encounters:  04/05/18 299 lb (135.6 kg)  04/05/18 300 lb (136.1 kg)  12/21/17 295 lb (133.8 kg)    Physical Exam  Constitutional: He appears well-developed and well-nourished.  HENT:  Head: Normocephalic and atraumatic.  Eyes: Pupils are  equal, round, and reactive to light. Conjunctivae and EOM are normal.  Neck: Normal range of motion. Neck supple.  Cardiovascular: Normal rate, regular rhythm and normal heart sounds.  Pulmonary/Chest: Effort normal and breath sounds normal.  Abdominal: Soft. Bowel sounds are normal.  Musculoskeletal: Normal range of motion.  Skin: Skin is warm and dry.    Results for orders placed or performed in visit on 07/03/17  Bayer DCA Hb A1c Waived  Result Value Ref Range   HB A1C (BAYER DCA - WAIVED) 6.8 <7.0 %  Lipid panel  Result Value Ref Range   Cholesterol, Total 193 100 - 199 mg/dL   Triglycerides 811 (H) 0 - 149 mg/dL   HDL 35 (L) >91 mg/dL   VLDL Cholesterol Cal 34 5 - 40 mg/dL   LDL Calculated 478 (H) 0 - 99 mg/dL   Chol/HDL Ratio 5.5 (H) 0.0 - 5.0 ratio      Assessment & Plan:   1. Well adult exam - CBC with Differential/Platelet; Future - CMP14+EGFR; Future - Lipid panel; Future - TSH; Future - PSA; Future - Bayer DCA Hb A1c Waived; Future  2. Trigeminal neuralgia - traMADol (ULTRAM) 50 MG tablet; Take 1 tablet (50 mg total) by mouth daily.  Dispense: 60 tablet; Refill: 5   Continue all other maintenance medications as listed above.  Follow up plan: Return in about 6 months (around 10/04/2018).  Educational handout given for survey  Remus Loffler PA-C [facility] [address]  [place], [place] [address] [phone]   04/10/2018, 7:53 AM

## 2018-04-14 DIAGNOSIS — M5126 Other intervertebral disc displacement, lumbar region: Secondary | ICD-10-CM | POA: Diagnosis not present

## 2018-04-20 ENCOUNTER — Encounter: Payer: Self-pay | Admitting: Family Medicine

## 2018-04-20 ENCOUNTER — Ambulatory Visit (INDEPENDENT_AMBULATORY_CARE_PROVIDER_SITE_OTHER): Payer: BLUE CROSS/BLUE SHIELD

## 2018-04-20 ENCOUNTER — Ambulatory Visit: Payer: BLUE CROSS/BLUE SHIELD | Admitting: Family Medicine

## 2018-04-20 VITALS — BP 111/81 | HR 65 | Temp 97.2°F | Ht 73.0 in | Wt 300.0 lb

## 2018-04-20 DIAGNOSIS — M25511 Pain in right shoulder: Secondary | ICD-10-CM | POA: Diagnosis not present

## 2018-04-20 DIAGNOSIS — Z0000 Encounter for general adult medical examination without abnormal findings: Secondary | ICD-10-CM

## 2018-04-20 DIAGNOSIS — R300 Dysuria: Secondary | ICD-10-CM | POA: Diagnosis not present

## 2018-04-20 DIAGNOSIS — M19011 Primary osteoarthritis, right shoulder: Secondary | ICD-10-CM | POA: Diagnosis not present

## 2018-04-20 LAB — URINALYSIS, COMPLETE
Bilirubin, UA: NEGATIVE
Ketones, UA: NEGATIVE
Leukocytes, UA: NEGATIVE
Nitrite, UA: NEGATIVE
Protein, UA: NEGATIVE
RBC, UA: NEGATIVE
Specific Gravity, UA: <1.005 — ABNORMAL LOW (ref 1.005–1.030)
Urobilinogen, Ur: 0.2 mg/dL (ref 0.2–1.0)
pH, UA: 6 (ref 5.0–7.5)

## 2018-04-20 LAB — MICROSCOPIC EXAMINATION
Bacteria, UA: NONE SEEN
RBC, UA: NONE SEEN /hpf (ref 0–2)
Renal Epithel, UA: NONE SEEN /hpf
WBC, UA: NONE SEEN /hpf (ref 0–5)

## 2018-04-20 LAB — BAYER DCA HB A1C WAIVED: HB A1C (BAYER DCA - WAIVED): 6.9 % (ref <7.0)

## 2018-04-20 MED ORDER — KETOROLAC TROMETHAMINE 30 MG/ML IJ SOLN: 30.0000 mg | Freq: Once | INTRAMUSCULAR | Status: DC

## 2018-04-20 NOTE — Progress Notes (Signed)
Subjective: CC: Right shoulder, urine issue PCP: Terald Sleeper, PA-C ULA:[id number] Matthew Pennington is a 52 y.o. male presenting to clinic today for:  1. Shoulder pain Patient reports right shoulder pain that has been ongoing for about 3 weeks.  He points to the anterior and posterior shoulder.  He notes that this started after he hit a door with his shoulder.  He had immediate pain afterwards and has had constant pain since.  Pain seems to be worse in the evening time.  He is unable to lie on the right side because of the pain.  He notes difficulty raising his right upper extremity above chest level secondary to pain.  He has chronic numbness and tingling in his extremities secondary to diabetes.  No change in numbness or tingling.  He has been using a topical OTC cream with some relief of the pain but no resolution.  He is also on tramadol, Neurontin and ibuprofen.  2.  Urine concern Patient reports some discomfort after completion of his stream.  Denies any hematuria, urinary frequency, urinary urgency, fevers, chills, abdominal pain.   ROS: Per HPI  Allergies  Allergen Reactions  . Morphine And Related Other (See Comments)    "Makes me go Crazy"   Past Medical History:  Diagnosis Date  . Headache   . Hyperlipidemia   . Hypertension   . Light headedness   . Tingling    toes  . Trigeminal neuralgia   . Trigeminal neuralgia   . Weakness     Current Outpatient Medications:  .  buPROPion (WELLBUTRIN XL) 150 MG 24 hr tablet, Take 1-2 tablets (150-300 mg total) by mouth daily., Disp: 60 tablet, Rfl: 5 .  empagliflozin (JARDIANCE) 25 MG TABS tablet, Take 25 mg by mouth daily., Disp: 30 tablet, Rfl: 5 .  enalapril (VASOTEC) 20 MG tablet, TAKE 1 TABLET BY MOUTH TWICE DAILY, Disp: 180 tablet, Rfl: 0 .  ezetimibe (ZETIA) 10 MG tablet, TAKE 1 TABLET BY MOUTH ONCE DAILY, Disp: 90 tablet, Rfl: 0 .  gabapentin (NEURONTIN) 300 MG capsule, TAKE 1 CAPSULE BY MOUTH THREE TIMES DAILY, Disp: 270 capsule,  Rfl: 0 .  ibuprofen (ADVIL,MOTRIN) 800 MG tablet, Take 1 tablet (800 mg total) by mouth daily., Disp: 90 tablet, Rfl: 5 .  metFORMIN (GLUCOPHAGE) 500 MG tablet, Take 1 tablet (500 mg total) by mouth 2 (two) times daily., Disp: 60 tablet, Rfl: 5 .  oxcarbazepine (TRILEPTAL) 600 MG tablet, Take 1 tablet (600 mg total) by mouth 3 (three) times daily., Disp: 90 tablet, Rfl: 5 .  pantoprazole (PROTONIX) 40 MG tablet, Take 1 tablet (40 mg total) by mouth daily., Disp: 30 tablet, Rfl: 0 .  rOPINIRole (REQUIP) 0.25 MG tablet, TAKE 1 TO 2 TABLETS BY MOUTH AT BEDTIME FOR  RESTLESS  LEGS, Disp: 180 tablet, Rfl: 0 .  traMADol (ULTRAM) 50 MG tablet, Take 1 tablet (50 mg total) by mouth daily., Disp: 60 tablet, Rfl: 5 Social History   Socioeconomic History  . Marital status: Married    Spouse name: Not on file  . Number of children: Not on file  . Years of education: Not on file  . Highest education level: Not on file  Occupational History  . Not on file  Social Needs  . Financial resource strain: Not on file  . Food insecurity:    Worry: Not on file    Inability: Not on file  . Transportation needs:    Medical: Not on file    Non-medical:  Not on file  Tobacco Use  . Smoking status: Never Smoker  . Smokeless tobacco: Never Used  Substance and Sexual Activity  . Alcohol use: No  . Drug use: No  . Sexual activity: Not on file  Lifestyle  . Physical activity:    Days per week: Not on file    Minutes per session: Not on file  . Stress: Not on file  Relationships  . Social connections:    Talks on phone: Not on file    Gets together: Not on file    Attends religious service: Not on file    Active member of club or organization: Not on file    Attends meetings of clubs or organizations: Not on file    Relationship status: Not on file  . Intimate partner violence:    Fear of current or ex partner: Not on file    Emotionally abused: Not on file    Physically abused: Not on file    Forced  sexual activity: Not on file  Other Topics Concern  . Not on file  Social History Narrative   Patient works full time as a Quarry manager.   Family History  Problem Relation Age of Onset  . Diabetes Mother   . Hyperlipidemia Mother   . Hypertension Mother   . Cancer Father        lung  . Heart disease Sister     Objective: Office vital signs reviewed. BP 111/81   Pulse 65   Temp (!) 97.2 F (36.2 C) (Oral)   Ht _0  (1.854 m)   Wt 300 lb (136.1 kg)   BMI 39.58 kg/m   Physical Examination:  General: Awake, alert, well nourished, No acute distress Extremities: warm, well perfused, No edema, cyanosis or clubbing; +2 pulses bilaterally MSK:  Right shoulder: Patient reduced active range of motion by about 40 degrees in abduction.  He has full flexion.  He does have full internal rotation of the shoulder but has pain with this.  No palpable defects.  He has tenderness to palpation over the [facility] joint and posterior shoulder. Painful arc noted.  Negative Hawkins.  Negative empty can. Neuro: 4/5 Strength in flexion and external rotation. Light touch sensation grossly intact  Dg Shoulder Right  Result Date: 04/20/2018 CLINICAL DATA:  Acute pain right shoulder. EXAM: RIGHT SHOULDER - 2+ VIEW COMPARISON:  No prior. FINDINGS: Acromioclavicular and glenohumeral degenerative change. Prominent acromioclavicular spurring. Downsloping acromion. No acute bony abnormality identified. No evidence of fracture or dislocation. IMPRESSION: Acromioclavicular and glenohumeral degenerative change. Prominent acromioclavicular spurring. Downsloping acromion. No acute abnormality. Electronically Signed   By: Marcello Moores  Register   On: 04/20/2018 09:10    Assessment/ Plan: 52 y.o. male   1. Acute pain of right shoulder Personal review of x-ray demonstrated an abnormality at the [facility] joint.  I do question a fracture at this joint.  It was read as degenerative changes within the [facility] and nonacute by the  radiologist.  We discussed that he certainly may also be having a component of rotator cuff tendinopathy.  However, I have recommended that he be put in a sling and see the orthopedic surgeon for further evaluation.  He was amenable to this.  Toradol injection was offered for pain but patient declined this for now.  He would like to continue his home remedies. - DG Shoulder Right; Future - Ambulatory referral to Orthopedic Surgery  2. Dysuria Intermittent.  Patient is afebrile nontoxic-appearing.  No evidence of  infection on urinalysis.  He does have 2+ glucose.  Otherwise urinalysis and urine microscopy totally unremarkable. - Urinalysis, Complete  3. Well adult exam Of note, this was only an acute exam.  Patient had labs ordered by his PCP which were released during today's visit.  No well adult exam was performed today. - Bayer DCA Hb A1c Waived - PSA - TSH - Lipid panel - CMP14+EGFR - CBC with Differential/Platelet   Orders Placed This Encounter  Procedures  . DG Shoulder Right    Standing Status:   Future    Number of Occurrences:   1    Standing Expiration Date:   06/21/2019    Order Specific Question:   Reason for Exam (SYMPTOM  OR DIAGNOSIS REQUIRED)    Answer:   blunt trauma 3 weeks ago.  r/o fracture.    Order Specific Question:   Preferred imaging location?    Answer:   Internal    Order Specific Question:   Radiology Contrast Protocol - do NOT remove file path    Answer:   [email or web address]  . Urinalysis, Complete  . Ambulatory referral to Orthopedic Surgery    Referral Priority:   Routine    Referral Type:   Surgical    Referral Reason:   Specialty Services Required    Requested Specialty:   Orthopedic Surgery    Number of Visits Requested:   [place], [place] [phone]

## 2018-04-20 NOTE — Patient Instructions (Addendum)
I have placed a referral to the orthopedist.  You will be called for an appointment.  As we discussed, I am concerned about your [facility] joint (there looks to be a crack at the end) and rotator cuff.    Rotator Cuff Tendinitis Rotator cuff tendinitis is inflammation of the tough, cord-like bands that connect muscle to bone (tendons) in the rotator cuff. The rotator cuff includes all of the muscles and tendons that connect the arm to the shoulder. The rotator cuff holds the head of the upper arm bone (humerus) in the cup (fossa) of the shoulder blade (scapula). This condition can lead to a long-lasting (chronic) tear. The tear may be partial or complete. What are the causes? This condition is usually caused by overusing the rotator cuff. What increases the risk? This condition is more likely to develop in athletes and workers who frequently use their shoulder or reach over their heads. This can include activities such as:  Tennis.  Baseball or softball.  Swimming.  Construction work.  Painting.  What are the signs or symptoms? Symptoms of this condition include:  Pain spreading (radiating) from the shoulder to the upper arm.  Swelling and tenderness in front of the shoulder.  Pain when reaching, pulling, or lifting the arm above the head.  Pain when lowering the arm from above the head.  Minor pain in the shoulder when resting.  Increased pain in the shoulder at night.  Difficulty placing the arm behind the back.  How is this diagnosed? This condition is diagnosed with a medical history and physical exam. Tests may also be done, including:  X-rays.  MRI.  Ultrasounds.  CT or MR arthrogram. During this test, a contrast material is injected and then images are taken.  How is this treated? Treatment for this condition depends on the severity of the condition. In less severe cases, treatment may include:  Rest. This may be done with a sling that holds the shoulder still  (immobilization). Your health care provider may also recommend avoiding activities that involve lifting your arm over your head.  Icing the shoulder.  Anti-inflammatory medicines, such as aspirin or ibuprofen.  In more severe cases, treatment may include:  Physical therapy.  Steroid injections.  Surgery.  Follow these instructions at home: If you have a sling:  Wear the sling as told by your health care provider. Remove it only as told by your health care provider.  Loosen the sling if your fingers tingle, become numb, or turn cold and blue.  Keep the sling clean.  If the sling is not waterproof, do not let it get wet. Remove it, if allowed, or cover it with a watertight covering when you take a bath or shower. Managing pain, stiffness, and swelling  If directed, put ice on the injured area. ? If you have a removable sling, remove it as told by your health care provider. ? Put ice in a plastic bag. ? Place a towel between your skin and the bag. ? Leave the ice on for 20 minutes, 2-3 times a day.  Move your fingers often to avoid stiffness and to lessen swelling.  Raise (elevate) the injured area above the level of your heart while you are lying down.  Find a comfortable sleeping position or sleep on a recliner, if available. Driving  Do not drive or use heavy machinery while taking prescription pain medicine.  Ask your health care provider when it is safe to drive if you have a sling  on your arm. Activity  Rest your shoulder as told by your health care provider.  Return to your normal activities as told by your health care provider. Ask your health care provider what activities are safe for you.  Do any exercises or stretches as told by your health care provider.  If you do repetitive overhead tasks, take small breaks in between and include stretching exercises as told by your health care provider. General instructions  Do not use any products that contain  nicotine or tobacco, such as cigarettes and e-cigarettes. These can delay healing. If you need help quitting, ask your health care provider.  Take over-the-counter and prescription medicines only as told by your health care provider.  Keep all follow-up visits as told by your health care provider. This is important. Contact a health care provider if:  Your pain gets worse.  You have new pain in your arm, hands, or fingers.  Your pain is not relieved with medicine or does not get better after 6 weeks of treatment.  You have cracking sensations when moving your shoulder in certain directions.  You hear a snapping sound after using your shoulder, followed by severe pain and weakness. Get help right away if:  Your arm, hand, or fingers are numb or tingling.  Your arm, hand, or fingers are swollen or painful or they turn white or blue. Summary  Rotator cuff tendinitis is inflammation of the tough, cord-like bands that connect muscle to bone (tendons) in the rotator cuff.  This condition is usually caused by overusing the rotator cuff, which includes all of the muscles and tendons that connect the arm to the shoulder.  This condition is more likely to develop in athletes and workers who frequently use their shoulder or reach over their heads.  Treatment generally includes rest, anti-inflammatory medicines, and icing. In some cases, physical therapy and steroid injections may be needed. In severe cases, surgery may be needed. This information is not intended to replace advice given to you by your health care provider. Make sure you discuss any questions you have with your health care provider. Document Released: 08/06/2003 Document Revised: 05/02/2016 Document Reviewed: 05/02/2016 Elsevier Interactive Patient Education  2017 [employer].

## 2018-04-21 LAB — CBC WITH DIFFERENTIAL/PLATELET
Basophils Absolute: 0.1 10*3/uL (ref 0.0–0.2)
Basos: 1 %
EOS (ABSOLUTE): 0.3 10*3/uL (ref 0.0–0.4)
Eos: 5 %
Hematocrit: 46.2 % (ref 37.5–51.0)
Hemoglobin: 15.5 g/dL (ref 13.0–17.7)
Immature Grans (Abs): 0 10*3/uL (ref 0.0–0.1)
Immature Granulocytes: 1 %
Lymphocytes Absolute: 2.3 10*3/uL (ref 0.7–3.1)
Lymphs: 35 %
MCH: 29.7 pg (ref 26.6–33.0)
MCHC: 33.5 g/dL (ref 31.5–35.7)
MCV: 89 fL (ref 79–97)
Monocytes Absolute: 0.5 10*3/uL (ref 0.1–0.9)
Monocytes: 8 %
Neutrophils Absolute: 3.3 10*3/uL (ref 1.4–7.0)
Neutrophils: 50 %
Platelets: 271 10*3/uL (ref 150–450)
RBC: 5.22 x10E6/uL (ref 4.14–5.80)
RDW: 12.9 % (ref 12.3–15.4)
WBC: 6.5 10*3/uL (ref 3.4–10.8)

## 2018-04-21 LAB — TSH: TSH: 1.02 u[IU]/mL (ref 0.450–4.500)

## 2018-04-21 LAB — CMP14+EGFR
ALT: 65 IU/L — ABNORMAL HIGH (ref 0–44)
AST: 36 IU/L (ref 0–40)
Albumin/Globulin Ratio: 2 (ref 1.2–2.2)
Albumin: 4.7 g/dL (ref 3.5–5.5)
Alkaline Phosphatase: 75 IU/L (ref 39–117)
BUN/Creatinine Ratio: 15 (ref 9–20)
BUN: 13 mg/dL (ref 6–24)
Bilirubin Total: 0.5 mg/dL (ref 0.0–1.2)
CO2: 21 mmol/L (ref 20–29)
Calcium: 10 mg/dL (ref 8.7–10.2)
Chloride: 100 mmol/L (ref 96–106)
Creatinine, Ser: 0.89 mg/dL (ref 0.76–1.27)
GFR calc Af Amer: 114 mL/min/{1.73_m2} (ref >59)
GFR calc non Af Amer: 98 mL/min/{1.73_m2} (ref >59)
Globulin, Total: 2.4 g/dL (ref 1.5–4.5)
Glucose: 135 mg/dL — ABNORMAL HIGH (ref 65–99)
Potassium: 4.7 mmol/L (ref 3.5–5.2)
Sodium: 136 mmol/L (ref 134–144)
Total Protein: 7.1 g/dL (ref 6.0–8.5)

## 2018-04-21 LAB — LIPID PANEL
Chol/HDL Ratio: 5.9 ratio — ABNORMAL HIGH (ref 0.0–5.0)
Cholesterol, Total: 211 mg/dL — ABNORMAL HIGH (ref 100–199)
HDL: 36 mg/dL — ABNORMAL LOW (ref >39)
LDL Calculated: 130 mg/dL — ABNORMAL HIGH (ref 0–99)
Triglycerides: 227 mg/dL — ABNORMAL HIGH (ref 0–149)
VLDL Cholesterol Cal: 45 mg/dL — ABNORMAL HIGH (ref 5–40)

## 2018-04-21 LAB — PSA: Prostate Specific Ag, Serum: 0.3 ng/mL (ref 0.0–4.0)

## 2018-04-23 ENCOUNTER — Other Ambulatory Visit: Payer: Self-pay | Admitting: Physician Assistant

## 2018-04-23 MED ORDER — PRAVASTATIN SODIUM 20 MG PO TABS: 20.0000 mg | ORAL_TABLET | Freq: Every day | ORAL | 1 refills | Status: DC

## 2018-04-27 ENCOUNTER — Telehealth: Payer: Self-pay | Admitting: *Deleted

## 2018-04-27 ENCOUNTER — Encounter: Payer: Self-pay | Admitting: Physician Assistant

## 2018-04-27 NOTE — Telephone Encounter (Signed)
Patient states he has paperwork that he needs filled out regarding his functional status for disability.  States he has already seen 2 disability doctors through [employer], and this paper work needs to be filled out by PCP.  Advised Particia Nearing, will be back in the office on 05/01/18.  Will check with her to see if patient needs appointment for paperwork, of if he can drop it off.

## 2018-04-28 DIAGNOSIS — G4733 Obstructive sleep apnea (adult) (pediatric): Secondary | ICD-10-CM | POA: Diagnosis not present

## 2018-04-29 NOTE — Telephone Encounter (Signed)
Just drop off and I will work on it

## 2018-05-03 DIAGNOSIS — G4733 Obstructive sleep apnea (adult) (pediatric): Secondary | ICD-10-CM | POA: Diagnosis not present

## 2018-05-07 NOTE — Telephone Encounter (Signed)
Attempts to contact pt without return call in over 3 days will close encounter.

## 2018-05-14 ENCOUNTER — Other Ambulatory Visit: Payer: Self-pay | Admitting: Physician Assistant

## 2018-05-14 DIAGNOSIS — G500 Trigeminal neuralgia: Secondary | ICD-10-CM

## 2018-05-15 DIAGNOSIS — M47816 Spondylosis without myelopathy or radiculopathy, lumbar region: Secondary | ICD-10-CM | POA: Diagnosis not present

## 2018-05-15 DIAGNOSIS — M5442 Lumbago with sciatica, left side: Secondary | ICD-10-CM | POA: Diagnosis not present

## 2018-05-15 DIAGNOSIS — M546 Pain in thoracic spine: Secondary | ICD-10-CM | POA: Diagnosis not present

## 2018-05-17 ENCOUNTER — Other Ambulatory Visit: Payer: Self-pay | Admitting: *Deleted

## 2018-05-18 ENCOUNTER — Other Ambulatory Visit: Payer: Self-pay | Admitting: Physician Assistant

## 2018-05-18 DIAGNOSIS — G500 Trigeminal neuralgia: Secondary | ICD-10-CM

## 2018-05-18 MED ORDER — PANTOPRAZOLE SODIUM 40 MG PO TBEC: 40.0000 mg | DELAYED_RELEASE_TABLET | Freq: Every day | ORAL | 11 refills | Status: DC

## 2018-05-25 DIAGNOSIS — M47816 Spondylosis without myelopathy or radiculopathy, lumbar region: Secondary | ICD-10-CM | POA: Diagnosis not present

## 2018-05-25 DIAGNOSIS — M5442 Lumbago with sciatica, left side: Secondary | ICD-10-CM | POA: Diagnosis not present

## 2018-05-25 DIAGNOSIS — M546 Pain in thoracic spine: Secondary | ICD-10-CM | POA: Diagnosis not present

## 2018-05-28 ENCOUNTER — Ambulatory Visit
Admission: RE | Admit: 2018-05-28 | Discharge: 2018-05-28 | Disposition: A | Discharge status: Home / Self Care | Payer: BLUE CROSS/BLUE SHIELD | Source: Ambulatory Visit | Attending: Orthopaedic Surgery | Admitting: Orthopaedic Surgery

## 2018-05-28 DIAGNOSIS — M48061 Spinal stenosis, lumbar region without neurogenic claudication: Secondary | ICD-10-CM | POA: Diagnosis not present

## 2018-05-28 DIAGNOSIS — G4733 Obstructive sleep apnea (adult) (pediatric): Secondary | ICD-10-CM | POA: Diagnosis not present

## 2018-05-28 DIAGNOSIS — M5126 Other intervertebral disc displacement, lumbar region: Secondary | ICD-10-CM

## 2018-05-29 DIAGNOSIS — M546 Pain in thoracic spine: Secondary | ICD-10-CM | POA: Diagnosis not present

## 2018-05-29 DIAGNOSIS — M5442 Lumbago with sciatica, left side: Secondary | ICD-10-CM | POA: Diagnosis not present

## 2018-05-29 DIAGNOSIS — M47816 Spondylosis without myelopathy or radiculopathy, lumbar region: Secondary | ICD-10-CM | POA: Diagnosis not present

## 2018-05-31 ENCOUNTER — Telehealth: Payer: Self-pay | Admitting: *Deleted

## 2018-05-31 ENCOUNTER — Other Ambulatory Visit: Payer: Self-pay | Admitting: Physician Assistant

## 2018-05-31 DIAGNOSIS — G500 Trigeminal neuralgia: Secondary | ICD-10-CM

## 2018-05-31 MED ORDER — TRAMADOL HCL 50 MG PO TABS: 50.0000 mg | ORAL_TABLET | Freq: Every day | ORAL | 5 refills | Status: DC

## 2018-05-31 NOTE — Telephone Encounter (Signed)
Prescription has been sent to [facility] in [place]

## 2018-05-31 NOTE — Telephone Encounter (Signed)
Pt aware.

## 2018-05-31 NOTE — Telephone Encounter (Signed)
Patient called stating that Tramadol was sent to the wrong pharmacy and would like for it to be sent over to [facility] in [place]

## 2018-06-20 ENCOUNTER — Other Ambulatory Visit: Payer: Self-pay | Admitting: Physician Assistant

## 2018-06-20 DIAGNOSIS — E1169 Type 2 diabetes mellitus with other specified complication: Secondary | ICD-10-CM

## 2018-06-20 DIAGNOSIS — E785 Hyperlipidemia, unspecified: Principal | ICD-10-CM

## 2018-06-28 DIAGNOSIS — G4733 Obstructive sleep apnea (adult) (pediatric): Secondary | ICD-10-CM | POA: Diagnosis not present

## 2018-07-07 ENCOUNTER — Encounter (HOSPITAL_COMMUNITY): Payer: Self-pay | Admitting: Emergency Medicine

## 2018-07-07 ENCOUNTER — Other Ambulatory Visit: Payer: Self-pay

## 2018-07-07 ENCOUNTER — Emergency Department (HOSPITAL_COMMUNITY): Payer: BLUE CROSS/BLUE SHIELD

## 2018-07-07 ENCOUNTER — Emergency Department (HOSPITAL_COMMUNITY)
Admission: EM | Admit: 2018-07-07 | Discharge: 2018-07-07 | Disposition: A | Discharge status: Home / Self Care | Payer: BLUE CROSS/BLUE SHIELD | Attending: Emergency Medicine | Admitting: Emergency Medicine

## 2018-07-07 DIAGNOSIS — I10 Essential (primary) hypertension: Secondary | ICD-10-CM | POA: Insufficient documentation

## 2018-07-07 DIAGNOSIS — E119 Type 2 diabetes mellitus without complications: Secondary | ICD-10-CM | POA: Insufficient documentation

## 2018-07-07 DIAGNOSIS — Z79899 Other long term (current) drug therapy: Secondary | ICD-10-CM | POA: Insufficient documentation

## 2018-07-07 DIAGNOSIS — J101 Influenza due to other identified influenza virus with other respiratory manifestations: Secondary | ICD-10-CM | POA: Insufficient documentation

## 2018-07-07 DIAGNOSIS — Z7984 Long term (current) use of oral hypoglycemic drugs: Secondary | ICD-10-CM | POA: Insufficient documentation

## 2018-07-07 DIAGNOSIS — R062 Wheezing: Secondary | ICD-10-CM | POA: Diagnosis not present

## 2018-07-07 DIAGNOSIS — R0602 Shortness of breath: Secondary | ICD-10-CM | POA: Diagnosis not present

## 2018-07-07 DIAGNOSIS — R05 Cough: Secondary | ICD-10-CM | POA: Diagnosis not present

## 2018-07-07 HISTORY — DX: Type 2 diabetes mellitus without complications: E11.9

## 2018-07-07 HISTORY — DX: Dependence on other enabling machines and devices: Z99.89

## 2018-07-07 HISTORY — DX: Personal history of other medical treatment: Z92.89

## 2018-07-07 HISTORY — DX: Obstructive sleep apnea (adult) (pediatric): G47.33

## 2018-07-07 HISTORY — DX: Palpitations: R00.2

## 2018-07-07 LAB — INFLUENZA PANEL BY PCR (TYPE A & B)
Influenza A By PCR: POSITIVE — AB
Influenza B By PCR: NEGATIVE

## 2018-07-07 LAB — BASIC METABOLIC PANEL
Anion gap: 10 (ref 5–15)
BUN: 11 mg/dL (ref 6–20)
CO2: 24 mmol/L (ref 22–32)
Calcium: 9.1 mg/dL (ref 8.9–10.3)
Chloride: 104 mmol/L (ref 98–111)
Creatinine, Ser: 0.96 mg/dL (ref 0.61–1.24)
GFR calc Af Amer: >60 mL/min (ref >60)
GFR calc non Af Amer: >60 mL/min (ref >60)
Glucose, Bld: 147 mg/dL — ABNORMAL HIGH (ref 70–99)
Potassium: 4 mmol/L (ref 3.5–5.1)
Sodium: 138 mmol/L (ref 135–145)

## 2018-07-07 LAB — CBC WITH DIFFERENTIAL/PLATELET
Abs Immature Granulocytes: 0.02 10*3/uL (ref 0.00–0.07)
Basophils Absolute: 0 10*3/uL (ref 0.0–0.1)
Basophils Relative: 0 %
Eosinophils Absolute: 0.2 10*3/uL (ref 0.0–0.5)
Eosinophils Relative: 4 %
HCT: 46.4 % (ref 39.0–52.0)
Hemoglobin: 14.9 g/dL (ref 13.0–17.0)
Immature Granulocytes: 0 %
Lymphocytes Relative: 36 %
Lymphs Abs: 1.7 10*3/uL (ref 0.7–4.0)
MCH: 29.4 pg (ref 26.0–34.0)
MCHC: 32.1 g/dL (ref 30.0–36.0)
MCV: 91.5 fL (ref 80.0–100.0)
Monocytes Absolute: 0.7 10*3/uL (ref 0.1–1.0)
Monocytes Relative: 14 %
Neutro Abs: 2.2 10*3/uL (ref 1.7–7.7)
Neutrophils Relative %: 46 %
Platelets: 193 10*3/uL (ref 150–400)
RBC: 5.07 MIL/uL (ref 4.22–5.81)
RDW: 13.2 % (ref 11.5–15.5)
WBC: 4.9 10*3/uL (ref 4.0–10.5)
nRBC: 0 % (ref 0.0–0.2)

## 2018-07-07 LAB — BRAIN NATRIURETIC PEPTIDE: B Natriuretic Peptide: 15 pg/mL (ref 0.0–100.0)

## 2018-07-07 LAB — TROPONIN I: Troponin I: <0.03 ng/mL (ref <0.03)

## 2018-07-07 MED ORDER — ALBUTEROL SULFATE HFA 108 (90 BASE) MCG/ACT IN AERS: 2.0000 | INHALATION_SPRAY | RESPIRATORY_TRACT | 0 refills | Status: DC | PRN

## 2018-07-07 MED ORDER — IPRATROPIUM-ALBUTEROL 0.5-2.5 (3) MG/3ML IN SOLN
3.0000 mL | Freq: Once | RESPIRATORY_TRACT | Status: AC
  Administered 2018-07-07: 3 mL via RESPIRATORY_TRACT
  Filled 2018-07-07: qty 3

## 2018-07-07 MED ORDER — OSELTAMIVIR PHOSPHATE 75 MG PO CAPS: 75.0000 mg | ORAL_CAPSULE | Freq: Two times a day (BID) | ORAL | 0 refills | Status: DC

## 2018-07-07 NOTE — Discharge Instructions (Addendum)
Take over the counter tylenol, as directed on packaging, as needed for discomfort. Increase your fluids for the next several days. Take the prescriptions as directed.  Call your regular medical doctor on Monday to schedule a follow up appointment this week.  Return to the Emergency Department immediately if worsening.

## 2018-07-07 NOTE — ED Triage Notes (Signed)
Shortness of breath, cough and body aches since Thursday.  Has been around person + with flu.

## 2018-07-07 NOTE — ED Provider Notes (Signed)
[facility] EMERGENCY DEPARTMENT Provider Note   CSN: [id number] Arrival date & time: 07/07/18  0802     History   Chief Complaint Chief Complaint  Patient presents with  . Shortness of Breath    HPI Matthew Pennington is a 53 y.o. male.  HPI Pt was seen at 0835.  Per pt's wife and pt, c/o gradual onset and persistence of constant runny/stuffy nose, sinus congestion, and cough for the past 2 days. Has been associated with "wheezing" and generalized body aches. Pt states he has been exposed to sick contacts dx with the flu. Denies fevers, no rash, no CP/SOB, no N/V/D, no abd pain.    Past Medical History:  Diagnosis Date  . Diabetes mellitus without complication ([place])   . H/O echocardiogram 09/2017   normal EF  . Headache   . Hyperlipidemia   . Hypertension   . Light headedness   . OSA on CPAP   . Palpitations   . Tingling    toes  . Trigeminal neuralgia   . Trigeminal neuralgia   . Weakness     Patient Active Problem List   Diagnosis Date Noted  . OSA (obstructive sleep apnea) 12/05/2017  . GAD (generalized anxiety disorder) 11/24/2017  . Leg cramps, sleep related 11/24/2017  . Hypertension   . Hypertension associated with diabetes ([place]) 03/29/2017  . Diabetes mellitus without complication ([place]) 88/50/2774  . Hyperlipidemia associated with type 2 diabetes mellitus ([place]) 03/29/2017  . Diverticulosis of large intestine without hemorrhage 03/29/2017  . History of prostatitis 03/29/2017  . Light headedness   . Tingling   . Weakness   . Trigeminal neuralgia     Past Surgical History:  Procedure Laterality Date  . NO PAST SURGERIES          Home Medications    Prior to Admission medications   Medication Sig Start Date End Date Taking? Authorizing Provider  buPROPion (WELLBUTRIN XL) 150 MG 24 hr tablet Take 1-2 tablets (150-300 mg total) by mouth daily. 04/05/18   Terald Sleeper, PA-C  empagliflozin (JARDIANCE) 25 MG TABS tablet Take 25 mg by mouth daily.  11/17/17   Terald Sleeper, PA-C  enalapril (VASOTEC) 20 MG tablet TAKE 1 TABLET BY MOUTH TWICE DAILY 04/11/18   Terald Sleeper, PA-C  ezetimibe (ZETIA) 10 MG tablet TAKE 1 TABLET BY MOUTH ONCE DAILY 06/21/18   Terald Sleeper, PA-C  gabapentin (NEURONTIN) 300 MG capsule TAKE 1 CAPSULE BY MOUTH THREE TIMES DAILY 04/11/18   Terald Sleeper, PA-C  ibuprofen (ADVIL,MOTRIN) 800 MG tablet Take 1 tablet (800 mg total) by mouth daily. 07/03/17   Terald Sleeper, PA-C  metFORMIN (GLUCOPHAGE) 500 MG tablet Take 1 tablet (500 mg total) by mouth 2 (two) times daily. 11/17/17   Terald Sleeper, PA-C  oxcarbazepine (TRILEPTAL) 600 MG tablet Take 1 tablet (600 mg total) by mouth 3 (three) times daily. 07/03/17   Terald Sleeper, PA-C  pantoprazole (PROTONIX) 40 MG tablet Take 1 tablet (40 mg total) by mouth daily. 05/18/18   Terald Sleeper, PA-C  pravastatin (PRAVACHOL) 20 MG tablet Take 1 tablet (20 mg total) by mouth daily. 04/23/18   Terald Sleeper, PA-C  rOPINIRole (REQUIP) 0.25 MG tablet TAKE 1 TO 2 TABLETS BY MOUTH AT BEDTIME FOR  RESTLESS  LEGS 04/11/18   Terald Sleeper, PA-C  traMADol (ULTRAM) 50 MG tablet Take 1 tablet (50 mg total) by mouth daily. 05/31/18   Terald Sleeper, PA-C  Family History Family History  Problem Relation Age of Onset  . Diabetes Mother   . Hyperlipidemia Mother   . Hypertension Mother   . Cancer Father        lung  . Heart disease Sister     Social History Social History   Tobacco Use  . Smoking status: Never Smoker  . Smokeless tobacco: Never Used  Substance Use Topics  . Alcohol use: No  . Drug use: No     Allergies   Morphine and related   Review of Systems Review of Systems ROS: Statement: All systems negative except as marked or noted in the HPI; Constitutional: Negative for fever and chills. +generalized body aches.; ; Eyes: Negative for eye pain, redness and discharge. ; ; ENMT: Negative for ear pain, hoarseness, sore throat. +nasal congestion, sinus pressure. ;  ; Cardiovascular: Negative for chest pain, palpitations, diaphoresis, dyspnea and peripheral edema. ; ; Respiratory: +cough, wheezing. Negative for stridor. ; ; Gastrointestinal: Negative for nausea, vomiting, diarrhea, abdominal pain, blood in stool, hematemesis, jaundice and rectal bleeding. . ; ; Genitourinary: Negative for dysuria, flank pain and hematuria. ; ; Musculoskeletal: Negative for back pain and neck pain. Negative for swelling and trauma.; ; Skin: Negative for pruritus, rash, abrasions, blisters, bruising and skin lesion.; ; Neuro: Negative for headache, lightheadedness and neck stiffness. Negative for weakness, altered level of consciousness, altered mental status, extremity weakness, paresthesias, involuntary movement, seizure and syncope.       Physical Exam Updated Vital Signs BP 122/83 (BP Location: Right Arm)   Pulse 77   Temp 98.2 F (36.8 C) (Oral)   Resp 19   Ht 6\' 1"  (1.854 m)   Wt 133.8 kg   SpO2 96%   BMI 38.92 kg/m   Physical Exam 0840: Physical examination:  Nursing notes reviewed; Vital signs and O2 SAT reviewed;  Constitutional: Well developed, Well nourished, Well hydrated, In no acute distress; Head:  Normocephalic, atraumatic; Eyes: EOMI, PERRL, No scleral icterus; ENMT: TM's clear bilat. +edemetous nasal turbinates bilat with clear rhinorrhea. Mouth and pharynx without lesions. No tonsillar exudates. No intra-oral edema. No submandibular or sublingual edema. No hoarse voice, no drooling, no stridor. No pain with manipulation of larynx. No trismus. Mouth and pharynx normal, Mucous membranes moist; Neck: Supple, Full range of motion, No lymphadenopathy; Cardiovascular: Regular rate and rhythm, No gallop; Respiratory: Breath sounds coarse & equal bilaterally, faint scattered wheezes. No audible wheezing. Speaking full sentences with ease, Normal respiratory effort/excursion; Chest: Nontender, Movement normal; Abdomen: Soft, Nontender, Nondistended, Normal bowel  sounds; Genitourinary: No CVA tenderness; Extremities: Peripheral pulses normal, No tenderness, +1 pedal edema bilat. No calf edema or asymmetry.; Neuro: AA&Ox3, Major CN grossly intact.  Speech clear. No gross focal motor or sensory deficits in extremities.; Skin: Color normal, Warm, Dry.   ED Treatments / Results  Labs (all labs ordered are listed, but only abnormal results are displayed)   EKG EKG Interpretation  Date/Time:  Saturday July 07 2018 08:29:04 EST Ventricular Rate:  75 PR Interval:    QRS Duration: 100 QT Interval:  367 QTC Calculation: 410 R Axis:   37 Text Interpretation:  Sinus rhythm Low voltage, precordial leads Borderline T abnormalities, inferior leads Baseline wander When compared with ECG of 07/01/2017 No significant change was found Confirmed by Francine Graven [phone]) on 07/07/2018 9:06:17 AM   Radiology   Procedures Procedures (including critical care time)  Medications Ordered in ED Medications  ipratropium-albuterol (DUONEB) 0.5-2.5 (3) MG/3ML nebulizer solution 3 mL (  3 mLs Nebulization Given 07/07/18 0845)     Initial Impression / Assessment and Plan / ED Course  I have reviewed the triage vital signs and the nursing notes.  Pertinent labs & imaging results that were available during my care of the patient were reviewed by me and considered in my medical decision making (see chart for details).  MDM Reviewed: previous chart, vitals and nursing note Reviewed previous: labs and ECG Interpretation: labs, ECG and x-ray    Results for orders placed or performed during the hospital encounter of 07/07/18  Influenza panel by PCR (type A & B)  Result Value Ref Range   Influenza A By PCR POSITIVE (A) NEGATIVE   Influenza B By PCR NEGATIVE NEGATIVE  Basic metabolic panel  Result Value Ref Range   Sodium 138 135 - 145 mmol/L   Potassium 4.0 3.5 - 5.1 mmol/L   Chloride 104 98 - 111 mmol/L   CO2 24 22 - 32 mmol/L   Glucose, Bld 147 (H) 70 - 99  mg/dL   BUN 11 6 - 20 mg/dL   Creatinine, Ser 0.96 0.61 - 1.24 mg/dL   Calcium 9.1 8.9 - 10.3 mg/dL   GFR calc non Af Amer >60 >60 mL/min   GFR calc Af Amer >60 >60 mL/min   Anion gap 10 5 - 15  CBC with Differential  Result Value Ref Range   WBC 4.9 4.0 - 10.5 K/uL   RBC 5.07 4.22 - 5.81 MIL/uL   Hemoglobin 14.9 13.0 - 17.0 g/dL   HCT 46.4 39.0 - 52.0 %   MCV 91.5 80.0 - 100.0 fL   MCH 29.4 26.0 - 34.0 pg   MCHC 32.1 30.0 - 36.0 g/dL   RDW 13.2 11.5 - 15.5 %   Platelets 193 150 - 400 K/uL   nRBC 0.0 0.0 - 0.2 %   Neutrophils Relative % 46 %   Neutro Abs 2.2 1.7 - 7.7 K/uL   Lymphocytes Relative 36 %   Lymphs Abs 1.7 0.7 - 4.0 K/uL   Monocytes Relative 14 %   Monocytes Absolute 0.7 0.1 - 1.0 K/uL   Eosinophils Relative 4 %   Eosinophils Absolute 0.2 0.0 - 0.5 K/uL   Basophils Relative 0 %   Basophils Absolute 0.0 0.0 - 0.1 K/uL   Immature Granulocytes 0 %   Abs Immature Granulocytes 0.02 0.00 - 0.07 K/uL  Troponin I - Once  Result Value Ref Range   Troponin I <0.03 <0.03 ng/mL  Brain natriuretic peptide  Result Value Ref Range   B Natriuretic Peptide 15.0 0.0 - 100.0 pg/mL   Dg Chest 2 View Result Date: 07/07/2018 CLINICAL DATA:  Cough, congestion and body aches for several days EXAM: CHEST - 2 VIEW COMPARISON:  03/07/2018 FINDINGS: Heart size is normal. There is mild perihilar peribronchial thickening. There are no focal consolidations or pleural effusions. No pulmonary edema. Midthoracic and UPPER lumbar spondylosis. IMPRESSION: 1. Bronchitic changes. 2. No focal acute pulmonary abnormality. Electronically Signed   By: Nolon Nations M.D.   On: 07/07/2018 09:24    1145:  Pt states he "feels better" after neb.  NAD, lungs CTA bilat, no wheezing, resps easy, speaking full sentences, Sats 100% R/A.  Pt ambulated around the ED with Sats remaining 96-99 % R/A, resps easy, NAD.  Wants to go home now. Tx for influenza. Dx and testing d/w pt and family.  Questions answered.  Verb  understanding, agreeable to d/c home with outpt f/u.  Final Clinical Impressions(s) / ED Diagnoses   Final diagnoses:  None    ED Discharge Orders    None       Francine Graven, DO 07/11/18 1837

## 2018-07-07 NOTE — ED Notes (Signed)
O2 stayed between 96-99 while ambulating. Steady gate, no complaints of dizziness.

## 2018-07-24 ENCOUNTER — Other Ambulatory Visit: Payer: Self-pay | Admitting: Physician Assistant

## 2018-07-24 DIAGNOSIS — I10 Essential (primary) hypertension: Principal | ICD-10-CM

## 2018-07-24 DIAGNOSIS — I152 Hypertension secondary to endocrine disorders: Secondary | ICD-10-CM

## 2018-07-24 DIAGNOSIS — E1159 Type 2 diabetes mellitus with other circulatory complications: Secondary | ICD-10-CM

## 2018-07-27 ENCOUNTER — Other Ambulatory Visit: Payer: Self-pay | Admitting: Physician Assistant

## 2018-07-27 DIAGNOSIS — E119 Type 2 diabetes mellitus without complications: Secondary | ICD-10-CM

## 2018-07-27 DIAGNOSIS — G500 Trigeminal neuralgia: Secondary | ICD-10-CM

## 2018-07-27 NOTE — Telephone Encounter (Signed)
04/05/18 OV rtc 6 mos

## 2018-07-28 DIAGNOSIS — G4733 Obstructive sleep apnea (adult) (pediatric): Secondary | ICD-10-CM | POA: Diagnosis not present

## 2018-08-14 ENCOUNTER — Other Ambulatory Visit: Payer: Self-pay | Admitting: Physician Assistant

## 2018-08-15 ENCOUNTER — Telehealth: Payer: Self-pay | Admitting: Physician Assistant

## 2018-08-15 NOTE — Telephone Encounter (Signed)
[facility] staff says the medicine was sent in but they had not had time to fill due to being short staffed.

## 2018-08-15 NOTE — Telephone Encounter (Signed)
Aware. 

## 2018-08-18 ENCOUNTER — Other Ambulatory Visit: Payer: Self-pay | Admitting: Physician Assistant

## 2018-08-18 DIAGNOSIS — E785 Hyperlipidemia, unspecified: Principal | ICD-10-CM

## 2018-08-18 DIAGNOSIS — E1169 Type 2 diabetes mellitus with other specified complication: Secondary | ICD-10-CM

## 2018-08-27 DIAGNOSIS — G4733 Obstructive sleep apnea (adult) (pediatric): Secondary | ICD-10-CM | POA: Diagnosis not present

## 2018-09-22 ENCOUNTER — Other Ambulatory Visit: Payer: Self-pay | Admitting: Physician Assistant

## 2018-09-22 DIAGNOSIS — E1169 Type 2 diabetes mellitus with other specified complication: Secondary | ICD-10-CM

## 2018-09-22 DIAGNOSIS — E785 Hyperlipidemia, unspecified: Principal | ICD-10-CM

## 2018-09-24 DIAGNOSIS — M47816 Spondylosis without myelopathy or radiculopathy, lumbar region: Secondary | ICD-10-CM | POA: Diagnosis not present

## 2018-09-24 DIAGNOSIS — M5442 Lumbago with sciatica, left side: Secondary | ICD-10-CM | POA: Diagnosis not present

## 2018-09-24 DIAGNOSIS — M546 Pain in thoracic spine: Secondary | ICD-10-CM | POA: Diagnosis not present

## 2018-09-27 DIAGNOSIS — G4733 Obstructive sleep apnea (adult) (pediatric): Secondary | ICD-10-CM | POA: Diagnosis not present

## 2018-09-28 ENCOUNTER — Other Ambulatory Visit: Payer: Self-pay | Admitting: Physician Assistant

## 2018-09-28 DIAGNOSIS — M5442 Lumbago with sciatica, left side: Secondary | ICD-10-CM | POA: Diagnosis not present

## 2018-09-28 DIAGNOSIS — M546 Pain in thoracic spine: Secondary | ICD-10-CM | POA: Diagnosis not present

## 2018-09-28 DIAGNOSIS — M47816 Spondylosis without myelopathy or radiculopathy, lumbar region: Secondary | ICD-10-CM | POA: Diagnosis not present

## 2018-10-03 DIAGNOSIS — M5442 Lumbago with sciatica, left side: Secondary | ICD-10-CM | POA: Diagnosis not present

## 2018-10-03 DIAGNOSIS — M546 Pain in thoracic spine: Secondary | ICD-10-CM | POA: Diagnosis not present

## 2018-10-03 DIAGNOSIS — M47816 Spondylosis without myelopathy or radiculopathy, lumbar region: Secondary | ICD-10-CM | POA: Diagnosis not present

## 2018-10-05 ENCOUNTER — Ambulatory Visit: Payer: BLUE CROSS/BLUE SHIELD | Admitting: Physician Assistant

## 2018-10-05 DIAGNOSIS — G4733 Obstructive sleep apnea (adult) (pediatric): Secondary | ICD-10-CM | POA: Diagnosis not present

## 2018-10-09 DIAGNOSIS — M5442 Lumbago with sciatica, left side: Secondary | ICD-10-CM | POA: Diagnosis not present

## 2018-10-09 DIAGNOSIS — M546 Pain in thoracic spine: Secondary | ICD-10-CM | POA: Diagnosis not present

## 2018-10-09 DIAGNOSIS — M47816 Spondylosis without myelopathy or radiculopathy, lumbar region: Secondary | ICD-10-CM | POA: Diagnosis not present

## 2018-10-18 ENCOUNTER — Other Ambulatory Visit: Payer: Self-pay | Admitting: Physician Assistant

## 2018-10-18 DIAGNOSIS — E1169 Type 2 diabetes mellitus with other specified complication: Secondary | ICD-10-CM

## 2018-10-19 ENCOUNTER — Other Ambulatory Visit: Payer: Self-pay | Admitting: Physician Assistant

## 2018-10-19 DIAGNOSIS — I152 Hypertension secondary to endocrine disorders: Secondary | ICD-10-CM

## 2018-10-19 DIAGNOSIS — E1159 Type 2 diabetes mellitus with other circulatory complications: Secondary | ICD-10-CM

## 2018-10-23 DIAGNOSIS — M47816 Spondylosis without myelopathy or radiculopathy, lumbar region: Secondary | ICD-10-CM | POA: Diagnosis not present

## 2018-10-23 DIAGNOSIS — M5442 Lumbago with sciatica, left side: Secondary | ICD-10-CM | POA: Diagnosis not present

## 2018-10-23 DIAGNOSIS — M546 Pain in thoracic spine: Secondary | ICD-10-CM | POA: Diagnosis not present

## 2018-10-27 DIAGNOSIS — G4733 Obstructive sleep apnea (adult) (pediatric): Secondary | ICD-10-CM | POA: Diagnosis not present

## 2018-10-30 DIAGNOSIS — M47816 Spondylosis without myelopathy or radiculopathy, lumbar region: Secondary | ICD-10-CM | POA: Diagnosis not present

## 2018-10-30 DIAGNOSIS — M546 Pain in thoracic spine: Secondary | ICD-10-CM | POA: Diagnosis not present

## 2018-10-30 DIAGNOSIS — M5442 Lumbago with sciatica, left side: Secondary | ICD-10-CM | POA: Diagnosis not present

## 2018-11-02 ENCOUNTER — Other Ambulatory Visit: Payer: Self-pay | Admitting: Physician Assistant

## 2018-11-02 DIAGNOSIS — E119 Type 2 diabetes mellitus without complications: Secondary | ICD-10-CM

## 2018-11-02 NOTE — Telephone Encounter (Signed)
Tried to call pt to schedule follow up appt for further refills but mailbox is full and can't leave message.

## 2018-11-02 NOTE — Telephone Encounter (Signed)
Last A1C 03/2018-ntbs for further refills-30 day supply no refills sent in.

## 2018-11-06 DIAGNOSIS — M5442 Lumbago with sciatica, left side: Secondary | ICD-10-CM | POA: Diagnosis not present

## 2018-11-06 DIAGNOSIS — M546 Pain in thoracic spine: Secondary | ICD-10-CM | POA: Diagnosis not present

## 2018-11-06 DIAGNOSIS — M47816 Spondylosis without myelopathy or radiculopathy, lumbar region: Secondary | ICD-10-CM | POA: Diagnosis not present

## 2018-11-19 ENCOUNTER — Other Ambulatory Visit: Payer: Self-pay | Admitting: Physician Assistant

## 2018-11-19 ENCOUNTER — Encounter: Payer: Self-pay | Admitting: Physician Assistant

## 2018-11-21 ENCOUNTER — Other Ambulatory Visit: Payer: Self-pay | Admitting: Physician Assistant

## 2018-11-21 DIAGNOSIS — G500 Trigeminal neuralgia: Secondary | ICD-10-CM

## 2018-11-22 DIAGNOSIS — G4733 Obstructive sleep apnea (adult) (pediatric): Secondary | ICD-10-CM | POA: Diagnosis not present

## 2018-11-24 ENCOUNTER — Other Ambulatory Visit: Payer: Self-pay | Admitting: Physician Assistant

## 2018-11-24 DIAGNOSIS — E785 Hyperlipidemia, unspecified: Secondary | ICD-10-CM

## 2018-11-24 DIAGNOSIS — E1169 Type 2 diabetes mellitus with other specified complication: Secondary | ICD-10-CM

## 2018-11-27 DIAGNOSIS — F4323 Adjustment disorder with mixed anxiety and depressed mood: Secondary | ICD-10-CM | POA: Diagnosis not present

## 2018-11-27 DIAGNOSIS — G4733 Obstructive sleep apnea (adult) (pediatric): Secondary | ICD-10-CM | POA: Diagnosis not present

## 2018-11-28 DIAGNOSIS — Z7984 Long term (current) use of oral hypoglycemic drugs: Secondary | ICD-10-CM | POA: Diagnosis not present

## 2018-11-28 DIAGNOSIS — Z885 Allergy status to narcotic agent status: Secondary | ICD-10-CM | POA: Diagnosis not present

## 2018-11-28 DIAGNOSIS — E1140 Type 2 diabetes mellitus with diabetic neuropathy, unspecified: Secondary | ICD-10-CM | POA: Diagnosis not present

## 2018-11-28 DIAGNOSIS — G500 Trigeminal neuralgia: Secondary | ICD-10-CM | POA: Diagnosis not present

## 2018-11-28 DIAGNOSIS — I10 Essential (primary) hypertension: Secondary | ICD-10-CM | POA: Diagnosis not present

## 2018-11-28 DIAGNOSIS — Z79899 Other long term (current) drug therapy: Secondary | ICD-10-CM | POA: Diagnosis not present

## 2018-11-28 DIAGNOSIS — W19XXXA Unspecified fall, initial encounter: Secondary | ICD-10-CM | POA: Diagnosis not present

## 2018-11-28 DIAGNOSIS — R079 Chest pain, unspecified: Secondary | ICD-10-CM | POA: Diagnosis not present

## 2018-11-28 DIAGNOSIS — R6884 Jaw pain: Secondary | ICD-10-CM | POA: Diagnosis not present

## 2018-12-03 DIAGNOSIS — M47816 Spondylosis without myelopathy or radiculopathy, lumbar region: Secondary | ICD-10-CM | POA: Diagnosis not present

## 2018-12-03 DIAGNOSIS — M5442 Lumbago with sciatica, left side: Secondary | ICD-10-CM | POA: Diagnosis not present

## 2018-12-03 DIAGNOSIS — M546 Pain in thoracic spine: Secondary | ICD-10-CM | POA: Diagnosis not present

## 2018-12-04 ENCOUNTER — Other Ambulatory Visit: Payer: Self-pay | Admitting: Physician Assistant

## 2018-12-04 DIAGNOSIS — G500 Trigeminal neuralgia: Secondary | ICD-10-CM

## 2018-12-04 NOTE — Telephone Encounter (Signed)
Patient aware. Patient would like to know if he can do a phone visit due to [place]. Please advise and send to pools.

## 2018-12-04 NOTE — Telephone Encounter (Signed)
Refill denied, ntbs for refills on controlled substances.

## 2018-12-05 ENCOUNTER — Encounter: Payer: Self-pay | Admitting: Physician Assistant

## 2018-12-05 ENCOUNTER — Ambulatory Visit (INDEPENDENT_AMBULATORY_CARE_PROVIDER_SITE_OTHER): Payer: BC Managed Care – PPO | Admitting: Physician Assistant

## 2018-12-05 DIAGNOSIS — E1159 Type 2 diabetes mellitus with other circulatory complications: Secondary | ICD-10-CM

## 2018-12-05 DIAGNOSIS — I10 Essential (primary) hypertension: Secondary | ICD-10-CM

## 2018-12-05 DIAGNOSIS — G4762 Sleep related leg cramps: Secondary | ICD-10-CM | POA: Diagnosis not present

## 2018-12-05 DIAGNOSIS — E119 Type 2 diabetes mellitus without complications: Secondary | ICD-10-CM | POA: Diagnosis not present

## 2018-12-05 DIAGNOSIS — G500 Trigeminal neuralgia: Secondary | ICD-10-CM

## 2018-12-05 DIAGNOSIS — E1169 Type 2 diabetes mellitus with other specified complication: Secondary | ICD-10-CM

## 2018-12-05 DIAGNOSIS — E785 Hyperlipidemia, unspecified: Secondary | ICD-10-CM

## 2018-12-05 DIAGNOSIS — I152 Hypertension secondary to endocrine disorders: Secondary | ICD-10-CM

## 2018-12-05 MED ORDER — PRAVASTATIN SODIUM 20 MG PO TABS: 20.0000 mg | ORAL_TABLET | Freq: Every day | ORAL | 1 refills | Status: DC

## 2018-12-05 MED ORDER — EZETIMIBE 10 MG PO TABS: 10.0000 mg | ORAL_TABLET | Freq: Every day | ORAL | 1 refills | Status: DC

## 2018-12-05 MED ORDER — TRAMADOL HCL 50 MG PO TABS: 50.0000 mg | ORAL_TABLET | Freq: Every day | ORAL | 5 refills | Status: DC

## 2018-12-05 MED ORDER — ENALAPRIL MALEATE 20 MG PO TABS: 20.0000 mg | ORAL_TABLET | Freq: Two times a day (BID) | ORAL | 1 refills | Status: DC

## 2018-12-05 MED ORDER — GABAPENTIN 300 MG PO CAPS: 300.0000 mg | ORAL_CAPSULE | Freq: Three times a day (TID) | ORAL | 3 refills | Status: DC

## 2018-12-05 MED ORDER — BUPROPION HCL ER (XL) 150 MG PO TB24: 150.0000 mg | ORAL_TABLET | Freq: Every day | ORAL | 5 refills | Status: DC

## 2018-12-05 MED ORDER — ROPINIROLE HCL 0.25 MG PO TABS: ORAL_TABLET | ORAL | 0 refills | Status: DC

## 2018-12-05 MED ORDER — IBUPROFEN 800 MG PO TABS: 800.0000 mg | ORAL_TABLET | Freq: Every day | ORAL | 5 refills | Status: DC

## 2018-12-05 MED ORDER — METFORMIN HCL 500 MG PO TABS: 500.0000 mg | ORAL_TABLET | Freq: Two times a day (BID) | ORAL | 5 refills | Status: DC

## 2018-12-05 MED ORDER — JARDIANCE 25 MG PO TABS: 25.0000 mg | ORAL_TABLET | Freq: Every day | ORAL | 5 refills | Status: DC

## 2018-12-05 NOTE — Telephone Encounter (Signed)
It can be televisit if needed

## 2018-12-05 NOTE — Progress Notes (Signed)
Telephone visit  Subjective: CC: Trigeminal neuralgia PCP: Matthew Sleeper, PA-C Matthew Pennington is a 53 y.o. male calls for telephone consult today. Patient provides verbal consent for consult held via phone.  Patient is identified with 2 separate identifiers.  At this time the entire area is on COVID-19 social distancing and stay home orders are in place.  Patient is of higher risk and therefore we are performing this by a virtual method.  Location of patient: Home Location of provider: [facility] Others present for call: No  Patient was seen in the emergency department last week because his right jaw and trigeminal neuralgia were so flared up.  He tries diligently not to have to use any pain medication and according to the [place] he has a very low use of this.  His daily in any dose of 5.  He states he may take 1 a day of the Ultram is not if he is really hurting a lot up to 3.  This has gotten him into a lot of pain and has continued to hurt.  There is nothing that he is able to do to give it relief.  He has not had any neurologic evaluation of this.  We have also discussed his other chronic medical conditions that do include hypertension, diabetes, hyperlipidemia, leg cramps.  He will come in for labs in the near future.  A order will be placed.  And he will come in at his next convenient time.   ROS: Per HPI  Allergies  Allergen Reactions   Morphine And Related Other (See Comments)    "Makes me go Crazy"   Past Medical History:  Diagnosis Date   Diabetes mellitus without complication ([place])    H/O echocardiogram 09/2017   normal EF   Headache    Hyperlipidemia    Hypertension    Light headedness    OSA on CPAP    Palpitations    Tingling    toes   Trigeminal neuralgia    Trigeminal neuralgia    Weakness     Current Outpatient Medications:    buPROPion (WELLBUTRIN XL) 150 MG 24 hr tablet, Take 1-2 tablets (150-300 mg total) by mouth daily., Disp: 60  tablet, Rfl: 5   empagliflozin (JARDIANCE) 25 MG TABS tablet, Take 25 mg by mouth daily., Disp: 30 tablet, Rfl: 5   enalapril (VASOTEC) 20 MG tablet, Take 1 tablet (20 mg total) by mouth 2 (two) times daily., Disp: 180 tablet, Rfl: 1   ezetimibe (ZETIA) 10 MG tablet, Take 1 tablet (10 mg total) by mouth daily. (Needs to be seen before next refill), Disp: 90 tablet, Rfl: 1   gabapentin (NEURONTIN) 300 MG capsule, Take 1 capsule (300 mg total) by mouth 3 (three) times daily., Disp: 270 capsule, Rfl: 3   ibuprofen (ADVIL) 800 MG tablet, Take 1 tablet (800 mg total) by mouth daily., Disp: 90 tablet, Rfl: 5   metFORMIN (GLUCOPHAGE) 500 MG tablet, Take 1 tablet (500 mg total) by mouth 2 (two) times daily., Disp: 60 tablet, Rfl: 5   oseltamivir (TAMIFLU) 75 MG capsule, Take 1 capsule (75 mg total) by mouth every 12 (twelve) hours., Disp: 10 capsule, Rfl: 0   oxcarbazepine (TRILEPTAL) 600 MG tablet, TAKE 1 TABLET BY MOUTH THREE TIMES DAILY, Disp: 90 tablet, Rfl: 0   pantoprazole (PROTONIX) 40 MG tablet, Take 1 tablet (40 mg total) by mouth daily. (Patient taking differently: Take 40 mg by mouth daily as needed (heartburn). ), Disp:  30 tablet, Rfl: 11   pravastatin (PRAVACHOL) 20 MG tablet, Take 1 tablet (20 mg total) by mouth daily., Disp: 90 tablet, Rfl: 1   rOPINIRole (REQUIP) 0.25 MG tablet, TAKE 1 TO 2 TABLETS BY MOUTH AT BEDTIME FOR&nbsp;&nbsp;RESTLESS&nbsp;&nbsp;LEGS, Disp: 180 tablet, Rfl: 0   traMADol (ULTRAM) 50 MG tablet, Take 1 tablet (50 mg total) by mouth daily., Disp: 60 tablet, Rfl: 5  Assessment/ Plan: 53 y.o. male   1. Trigeminal neuralgia - traMADol (ULTRAM) 50 MG tablet; Take 1 tablet (50 mg total) by mouth daily.  Dispense: 60 tablet; Refill: 5 - gabapentin (NEURONTIN) 300 MG capsule; Take 1 capsule (300 mg total) by mouth 3 (three) times daily.  Dispense: 270 capsule; Refill: 3 - ibuprofen (ADVIL) 800 MG tablet; Take 1 tablet (800 mg total) by mouth daily.  Dispense: 90  tablet; Refill: 5 - Ambulatory referral to Neurology  2. Diabetes mellitus without complication (HCC) - metFORMIN (GLUCOPHAGE) 500 MG tablet; Take 1 tablet (500 mg total) by mouth 2 (two) times daily.  Dispense: 60 tablet; Refill: 5 - CMP14+EGFR; Future - CBC with Differential/Platelet; Future - Lipid panel; Future - TSH; Future - Bayer DCA Hb A1c Waived; Future  3. Leg cramps, sleep related - rOPINIRole (REQUIP) 0.25 MG tablet; TAKE 1 TO 2 TABLETS BY MOUTH AT BEDTIME FOR&nbsp;&nbsp;RESTLESS&nbsp;&nbsp;LEGS  Dispense: 180 tablet; Refill: 0  4. Hypertension associated with diabetes ([place]) - enalapril (VASOTEC) 20 MG tablet; Take 1 tablet (20 mg total) by mouth 2 (two) times daily.  Dispense: 180 tablet; Refill: 1 - CMP14+EGFR; Future - CBC with Differential/Platelet; Future - Lipid panel; Future - TSH; Future - Bayer DCA Hb A1c Waived; Future  5. Hyperlipidemia associated with type 2 diabetes mellitus (HCC) - ezetimibe (ZETIA) 10 MG tablet; Take 1 tablet (10 mg total) by mouth daily. (Needs to be seen before next refill)  Dispense: 90 tablet; Refill: 1 - Lipid panel; Future   No follow-ups on file.  Continue all other maintenance medications as listed above.  Start time: 1:19 PM End time: 1:31 PM  Meds ordered this encounter  Medications   traMADol (ULTRAM) 50 MG tablet    Sig: Take 1 tablet (50 mg total) by mouth daily.    Dispense:  60 tablet    Refill:  5    Order Specific Question:   Supervising Provider    Answer:   Janora Norlander [[id number]]   gabapentin (NEURONTIN) 300 MG capsule    Sig: Take 1 capsule (300 mg total) by mouth 3 (three) times daily.    Dispense:  270 capsule    Refill:  3    Order Specific Question:   Supervising Provider    Answer:   Janora Norlander [[id number]]   ibuprofen (ADVIL) 800 MG tablet    Sig: Take 1 tablet (800 mg total) by mouth daily.    Dispense:  90 tablet    Refill:  5    Order Specific Question:   Supervising Provider      Answer:   Janora Norlander [[id number]]   empagliflozin (JARDIANCE) 25 MG TABS tablet    Sig: Take 25 mg by mouth daily.    Dispense:  30 tablet    Refill:  5    Order Specific Question:   Supervising Provider    Answer:   Janora Norlander [[id number]]   metFORMIN (GLUCOPHAGE) 500 MG tablet    Sig: Take 1 tablet (500 mg total) by mouth 2 (two) times daily.    Dispense:  60 tablet    Refill:  5    Order Specific Question:   Supervising Provider    Answer:   Janora Norlander [[id number]]   pravastatin (PRAVACHOL) 20 MG tablet    Sig: Take 1 tablet (20 mg total) by mouth daily.    Dispense:  90 tablet    Refill:  1    Order Specific Question:   Supervising Provider    Answer:   Janora Norlander [[id number]]   rOPINIRole (REQUIP) 0.25 MG tablet    Sig: TAKE 1 TO 2 TABLETS BY MOUTH AT BEDTIME FOR&nbsp;&nbsp;RESTLESS&nbsp;&nbsp;LEGS    Dispense:  180 tablet    Refill:  0    Please consider 90 day supplies to promote better adherence    Order Specific Question:   Supervising Provider    Answer:   Janora Norlander [[id number]]   buPROPion (WELLBUTRIN XL) 150 MG 24 hr tablet    Sig: Take 1-2 tablets (150-300 mg total) by mouth daily.    Dispense:  60 tablet    Refill:  5    Order Specific Question:   Supervising Provider    Answer:   Janora Norlander [[id number]]   enalapril (VASOTEC) 20 MG tablet    Sig: Take 1 tablet (20 mg total) by mouth 2 (two) times daily.    Dispense:  180 tablet    Refill:  1    Order Specific Question:   Supervising Provider    Answer:   Janora Norlander [[id number]]   ezetimibe (ZETIA) 10 MG tablet    Sig: Take 1 tablet (10 mg total) by mouth daily. (Needs to be seen before next refill)    Dispense:  90 tablet    Refill:  1    Order Specific Question:   Supervising Provider    Answer:   Janora Norlander [[id number]]    Particia Nearing PA-C [place] [phone]

## 2018-12-06 DIAGNOSIS — F4323 Adjustment disorder with mixed anxiety and depressed mood: Secondary | ICD-10-CM | POA: Diagnosis not present

## 2018-12-10 DIAGNOSIS — M47816 Spondylosis without myelopathy or radiculopathy, lumbar region: Secondary | ICD-10-CM | POA: Diagnosis not present

## 2018-12-10 DIAGNOSIS — M546 Pain in thoracic spine: Secondary | ICD-10-CM | POA: Diagnosis not present

## 2018-12-10 DIAGNOSIS — M5442 Lumbago with sciatica, left side: Secondary | ICD-10-CM | POA: Diagnosis not present

## 2018-12-11 ENCOUNTER — Ambulatory Visit: Payer: BC Managed Care – PPO | Admitting: Psychiatry

## 2018-12-17 DIAGNOSIS — M546 Pain in thoracic spine: Secondary | ICD-10-CM | POA: Diagnosis not present

## 2018-12-17 DIAGNOSIS — M5442 Lumbago with sciatica, left side: Secondary | ICD-10-CM | POA: Diagnosis not present

## 2018-12-17 DIAGNOSIS — M47816 Spondylosis without myelopathy or radiculopathy, lumbar region: Secondary | ICD-10-CM | POA: Diagnosis not present

## 2018-12-24 DIAGNOSIS — M5442 Lumbago with sciatica, left side: Secondary | ICD-10-CM | POA: Diagnosis not present

## 2018-12-24 DIAGNOSIS — M47816 Spondylosis without myelopathy or radiculopathy, lumbar region: Secondary | ICD-10-CM | POA: Diagnosis not present

## 2018-12-24 DIAGNOSIS — M546 Pain in thoracic spine: Secondary | ICD-10-CM | POA: Diagnosis not present

## 2018-12-27 DIAGNOSIS — G4733 Obstructive sleep apnea (adult) (pediatric): Secondary | ICD-10-CM | POA: Diagnosis not present

## 2019-01-07 DIAGNOSIS — M5442 Lumbago with sciatica, left side: Secondary | ICD-10-CM | POA: Diagnosis not present

## 2019-01-07 DIAGNOSIS — M546 Pain in thoracic spine: Secondary | ICD-10-CM | POA: Diagnosis not present

## 2019-01-07 DIAGNOSIS — M47816 Spondylosis without myelopathy or radiculopathy, lumbar region: Secondary | ICD-10-CM | POA: Diagnosis not present

## 2019-01-14 DIAGNOSIS — M546 Pain in thoracic spine: Secondary | ICD-10-CM | POA: Diagnosis not present

## 2019-01-14 DIAGNOSIS — M47816 Spondylosis without myelopathy or radiculopathy, lumbar region: Secondary | ICD-10-CM | POA: Diagnosis not present

## 2019-01-14 DIAGNOSIS — M5442 Lumbago with sciatica, left side: Secondary | ICD-10-CM | POA: Diagnosis not present

## 2019-01-15 DIAGNOSIS — G4733 Obstructive sleep apnea (adult) (pediatric): Secondary | ICD-10-CM | POA: Diagnosis not present

## 2019-01-17 ENCOUNTER — Ambulatory Visit: Payer: Self-pay | Admitting: Neurology

## 2019-01-25 DIAGNOSIS — M546 Pain in thoracic spine: Secondary | ICD-10-CM | POA: Diagnosis not present

## 2019-01-25 DIAGNOSIS — M47816 Spondylosis without myelopathy or radiculopathy, lumbar region: Secondary | ICD-10-CM | POA: Diagnosis not present

## 2019-01-25 DIAGNOSIS — M5442 Lumbago with sciatica, left side: Secondary | ICD-10-CM | POA: Diagnosis not present

## 2019-01-27 DIAGNOSIS — G4733 Obstructive sleep apnea (adult) (pediatric): Secondary | ICD-10-CM | POA: Diagnosis not present

## 2019-01-31 ENCOUNTER — Ambulatory Visit (INDEPENDENT_AMBULATORY_CARE_PROVIDER_SITE_OTHER): Payer: BC Managed Care – PPO | Admitting: Family Medicine

## 2019-01-31 ENCOUNTER — Other Ambulatory Visit: Payer: Self-pay

## 2019-01-31 ENCOUNTER — Encounter: Payer: Self-pay | Admitting: Family Medicine

## 2019-01-31 DIAGNOSIS — Z20828 Contact with and (suspected) exposure to other viral communicable diseases: Secondary | ICD-10-CM | POA: Diagnosis not present

## 2019-01-31 DIAGNOSIS — Z20822 Contact with and (suspected) exposure to covid-19: Secondary | ICD-10-CM

## 2019-01-31 DIAGNOSIS — R6889 Other general symptoms and signs: Secondary | ICD-10-CM | POA: Diagnosis not present

## 2019-01-31 NOTE — Patient Instructions (Addendum)
COVID Testing Locations Monday-Friday 8 AM - 3:30 PM   [place]: [facility], [address] [place], [place]  On September 10th the site changes to [address] main visitor entrance [place]: [place], [place], [place], [place] (entrance off [employer]) [place]: Short Stay Entrance at [facility] and they will be closed on Mondays.    COVID-19: How to Protect Yourself and Others Know how it spreads  There is currently no vaccine to prevent coronavirus disease 2019 (COVID-19).  The best way to prevent illness is to avoid being exposed to this virus.  The virus is thought to spread mainly from person-to-person. ? Between people who are in close contact with one another (within about 6 feet). ? Through respiratory droplets produced when an infected person coughs, sneezes or talks. ? These droplets can land in the mouths or noses of people who are nearby or possibly be inhaled into the lungs. ? Some recent studies have suggested that COVID-19 may be spread by people who are not showing symptoms. Everyone should Clean your hands often  Wash your hands often with soap and water for at least 20 seconds especially after you have been in a public place, or after blowing your nose, coughing, or sneezing.  If soap and water are not readily available, use a hand sanitizer that contains at least 60% alcohol. Cover all surfaces of your hands and rub them together until they feel dry.  Avoid touching your eyes, nose, and mouth with unwashed hands. Avoid close contact  Stay home if you are sick.  Avoid close contact with people who are sick.  Put distance between yourself and other people. ? Remember that some people without symptoms may be able to spread virus. ? This is especially important for people who are at higher risk of getting very  [email or web address].cy Cover your mouth and nose with a cloth face cover when around others  You could spread COVID-19 to others even if you do not feel sick.  Everyone should wear a cloth face cover when they have to go out in public, for example to the grocery store or to pick up other necessities. ? Cloth face coverings should not be placed on young children under age 44, anyone who has trouble breathing, or is unconscious, incapacitated or otherwise unable to remove the mask without assistance.  The cloth face cover is meant to protect other people in case you are infected.  Do NOT use a facemask meant for a Dietitian.  Continue to keep about 6 feet between yourself and others. The cloth face cover is not a substitute for social distancing. Cover coughs and sneezes  If you are in a private setting and do not have on your cloth face covering, remember to always cover your mouth and nose with a tissue when you cough or sneeze or use the inside of your elbow.  Throw used tissues in the trash.  Immediately wash your hands with soap and water for at least 20 seconds. If soap and water are not readily available, clean your hands with a hand sanitizer that contains at least 60% alcohol. Clean and disinfect  Clean AND disinfect frequently touched surfaces daily. This includes tables, doorknobs, light switches, countertops, handles, desks, phones, keyboards, toilets, faucets, and sinks. [email or web address]  If surfaces are dirty, clean them: Use detergent or soap and water prior to disinfection.  Then, use a household disinfectant. You  can see a list of EPA-registered household disinfectants here. michellinders.com 10/02/2018 This information is not intended to replace advice given to you by your health care provider. Make sure you discuss any questions you  have with your health care provider. Document Released: 09/11/2018 Document Revised: 10/10/2018 Document Reviewed: 09/11/2018 Elsevier Patient Education  [place].

## 2019-01-31 NOTE — Progress Notes (Signed)
Virtual Visit via Telephone Note  I connected with Matthew Pennington on 01/31/19 at 9:41 AM by telephone and verified that I am speaking with the correct person using two identifiers. Matthew Pennington is currently located at home and nobody is currently with him during this visit. The provider, Loman Brooklyn, FNP is located in their office at time of visit.  I discussed the limitations, risks, security and privacy concerns of performing an evaluation and management service by telephone and the availability of in person appointments. I also discussed with the patient that there may be a patient responsible charge related to this service. The patient expressed understanding and agreed to proceed.  Subjective: PCP: Terald Sleeper, PA-C  Chief Complaint  Patient presents with  . Other    COVID exposure   Patient reports he had dinner with his son four days ago and his son now has symptoms of COVID-19 and is being tested today. The son's fiance has tested positive for COVID-19. The patient denies any symptoms and states he feels great. 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    ROS: Per HPI  Current Outpatient Medications:  .  buPROPion (WELLBUTRIN XL) 150 MG 24 hr tablet, Take 1-2 tablets (150-300 mg total) by mouth daily., Disp: 60 tablet, Rfl: 5 .  empagliflozin (JARDIANCE) 25 MG TABS tablet, Take 25 mg by mouth daily., Disp: 30 tablet, Rfl: 5 .  enalapril (VASOTEC) 20 MG tablet, Take 1 tablet (20 mg total) by mouth 2 (two) times daily., Disp: 180 tablet, Rfl: 1 .  ezetimibe (ZETIA) 10 MG tablet, Take 1 tablet (10 mg total) by mouth daily. (Needs to be seen before next refill), Disp: 90 tablet, Rfl: 1 .  gabapentin (NEURONTIN) 300 MG capsule, Take 1 capsule (300 mg total) by mouth 3 (three) times daily., Disp: 270 capsule, Rfl: 3 .  ibuprofen (ADVIL) 800 MG tablet, Take 1 tablet (800 mg total) by mouth daily., Disp: 90 tablet, Rfl: 5 .  metFORMIN (GLUCOPHAGE) 500 MG tablet, Take 1 tablet  (500 mg total) by mouth 2 (two) times daily., Disp: 60 tablet, Rfl: 5 .  oxcarbazepine (TRILEPTAL) 600 MG tablet, TAKE 1 TABLET BY MOUTH THREE TIMES DAILY, Disp: 90 tablet, Rfl: 0 .  pantoprazole (PROTONIX) 40 MG tablet, Take 1 tablet (40 mg total) by mouth daily. (Patient taking differently: Take 40 mg by mouth daily as needed (heartburn). ), Disp: 30 tablet, Rfl: 11 .  pravastatin (PRAVACHOL) 20 MG tablet, Take 1 tablet (20 mg total) by mouth daily., Disp: 90 tablet, Rfl: 1 .  rOPINIRole (REQUIP) 0.25 MG tablet, TAKE 1 TO 2 TABLETS BY MOUTH AT BEDTIME FOR&nbsp;&nbsp;RESTLESS&nbsp;&nbsp;LEGS, Disp: 180 tablet, Rfl: 0 .  traMADol (ULTRAM) 50 MG tablet, Take 1 tablet (50 mg total) by mouth daily., Disp: 60 tablet, Rfl: 5  Allergies  Allergen Reactions  . Morphine And Related Other (See Comments)    "Makes me go Crazy"   Past Medical History:  Diagnosis Date  . Diabetes mellitus without complication ([place])   . H/O echocardiogram 09/2017   normal EF  . Headache   . Hyperlipidemia   . Hypertension   . Light headedness   . OSA on CPAP   . Palpitations   . Tingling    toes  . Trigeminal neuralgia   . Trigeminal neuralgia   . Weakness     Observations/Objective: A&O  No respiratory distress or wheezing audible over the phone Mood, judgement, and thought processes all WNL  Assessment and Plan: 1. Close Exposure to Covid-19 Virus - Education provided on COVID-19. Encouraged patient to quarantine until he receives his results.  - Novel Coronavirus, NAA ([facility])   Follow Up Instructions:  I discussed the assessment and treatment plan with the patient. The patient was provided an opportunity to ask questions and all were answered. The patient agreed with the plan and demonstrated an understanding of the instructions.   The patient was advised to call back or seek an in-person evaluation if the symptoms worsen or if the condition fails to improve as anticipated.  The above  assessment and management plan was discussed with the patient. The patient verbalized understanding of and has agreed to the management plan. Patient is aware to call the clinic if symptoms persist or worsen. Patient is aware when to return to the clinic for a follow-up visit. Patient educated on when it is appropriate to go to the emergency department.   Time call ended: 9:55 AM  I provided 15 minutes of non-face-to-face time during this encounter.  Hendricks Limes, MSN, APRN, FNP-C [facility] 01/31/19

## 2019-02-01 ENCOUNTER — Telehealth: Payer: Self-pay | Admitting: Physician Assistant

## 2019-02-01 LAB — NOVEL CORONAVIRUS, NAA: SARS-CoV-2, NAA: NOT DETECTED

## 2019-02-01 NOTE — Telephone Encounter (Signed)
Patient is calling receive COVID 19 negative test results. Patient expressed understanding.

## 2019-02-05 DIAGNOSIS — Z23 Encounter for immunization: Secondary | ICD-10-CM | POA: Diagnosis not present

## 2019-02-08 DIAGNOSIS — M5442 Lumbago with sciatica, left side: Secondary | ICD-10-CM | POA: Diagnosis not present

## 2019-02-08 DIAGNOSIS — M546 Pain in thoracic spine: Secondary | ICD-10-CM | POA: Diagnosis not present

## 2019-02-08 DIAGNOSIS — M47816 Spondylosis without myelopathy or radiculopathy, lumbar region: Secondary | ICD-10-CM | POA: Diagnosis not present

## 2019-02-11 DIAGNOSIS — M5442 Lumbago with sciatica, left side: Secondary | ICD-10-CM | POA: Diagnosis not present

## 2019-02-11 DIAGNOSIS — M546 Pain in thoracic spine: Secondary | ICD-10-CM | POA: Diagnosis not present

## 2019-02-11 DIAGNOSIS — M47816 Spondylosis without myelopathy or radiculopathy, lumbar region: Secondary | ICD-10-CM | POA: Diagnosis not present

## 2019-02-14 DIAGNOSIS — M5442 Lumbago with sciatica, left side: Secondary | ICD-10-CM | POA: Diagnosis not present

## 2019-02-14 DIAGNOSIS — M546 Pain in thoracic spine: Secondary | ICD-10-CM | POA: Diagnosis not present

## 2019-02-14 DIAGNOSIS — M47816 Spondylosis without myelopathy or radiculopathy, lumbar region: Secondary | ICD-10-CM | POA: Diagnosis not present

## 2019-02-21 ENCOUNTER — Encounter

## 2019-02-21 ENCOUNTER — Encounter: Payer: Self-pay | Admitting: Neurology

## 2019-02-21 ENCOUNTER — Ambulatory Visit: Payer: BC Managed Care – PPO | Admitting: Neurology

## 2019-02-21 ENCOUNTER — Other Ambulatory Visit: Payer: Self-pay

## 2019-02-21 VITALS — BP 114/71 | HR 90 | Temp 97.1°F | Ht 73.0 in | Wt 302.6 lb

## 2019-02-21 DIAGNOSIS — G500 Trigeminal neuralgia: Secondary | ICD-10-CM | POA: Diagnosis not present

## 2019-02-21 DIAGNOSIS — R519 Headache, unspecified: Secondary | ICD-10-CM

## 2019-02-21 DIAGNOSIS — R200 Anesthesia of skin: Secondary | ICD-10-CM

## 2019-02-21 DIAGNOSIS — R51 Headache: Secondary | ICD-10-CM | POA: Diagnosis not present

## 2019-02-21 DIAGNOSIS — R209 Unspecified disturbances of skin sensation: Secondary | ICD-10-CM | POA: Diagnosis not present

## 2019-02-21 DIAGNOSIS — R202 Paresthesia of skin: Secondary | ICD-10-CM

## 2019-02-21 MED ORDER — OXYCODONE-ACETAMINOPHEN 10-325 MG PO TABS: 1.0000 | ORAL_TABLET | Freq: Four times a day (QID) | ORAL | 0 refills | Status: DC | PRN

## 2019-02-21 NOTE — Progress Notes (Signed)
[facility]    Provider:  Dr Jaynee Eagles Requesting Provider: Terald Sleeper, PA-C Primary Care Provider:  Terald Sleeper, PA-C  CC:  Trigeminal Neuralgia  HPI:  Matthew Pennington is a 53 y.o. male here as requested by Terald Sleeper, PA-C for Trigeminal neuralgia(TGN). Here with his wife who also provides information. PMHx of TGN for 10 years. He saw Dr Volanda Napoleon in [place] for many years, he is a retired Higher education careers adviser. He saw another neurologist and he was started on Gabapentin and he has been on that for many years. He has had brain scans years ago not recently starts in front of the ear and in the maxillary division. Shooting sharp. Its severe, puts him "on the floor". Stress makes it worse, eating/chewing makes it worse, gabapentin has helped he used to have attacks all the time. He had such a bad attack he went to the ED. He doesn't want pin medication. He is having the symptoms off and on for years. Certain foods or crunching hard can exacerbate it. Brief, severe, 2 minutes and then over but can happen again and multiple times in a day. He can't chew currently on that side for 2 weeks now. No rashes, fevers, he has headaches sometimes , he uses the cpap. Sharp pain. 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 No other focal neurologic deficits, associated symptoms, inciting events or modifiable factors.  Reviewed notes, labs and imaging from outside physicians, which showed:  I reviewed Particia Nearing notes.  Patient was seen in the emergency room a week prior to last appointment because his right jaw and trigeminal neuralgia were so flared up.  He tries diligently not to have to use any pain medication and according to his database he has a very low use of this.  He may take Ultram if it is hurting him a lot.  He has severe pain, it continues to hurt, nothing that he does is able to give him relief, he has not had any recent neurologic  work-up of this.  He has multiple other chronic medical conditions as well.  Review of Systems: Patient complains of symptoms per HPI as well as the following symptoms: facial pain, obesity, muscle pain and fatigue. Pertinent negatives and positives per HPI. All others negative.   Social History   Socioeconomic History  . Marital status: Married    Spouse name: Leana Roe  . Number of children: 3  . Years of education: Not on file  . Highest education level: Associate degree: academic program  Occupational History  . Not on file  Social Needs  . Financial resource strain: Not on file  . Food insecurity    Worry: Not on file    Inability: Not on file  . Transportation needs    Medical: Not on file    Non-medical: Not on file  Tobacco Use  . Smoking status: Never Smoker  . Smokeless tobacco: Never Used  Substance and Sexual Activity  . Alcohol use: No  . Drug use: No  . Sexual activity: Not on file  Lifestyle  . Physical activity    Days per week: Not on file    Minutes per session: Not on file  . Stress: Not on file  Relationships  . Social Herbalist on phone: Not on file    Gets together: Not on file    Attends religious service: Not on  file    Active member of club or organization: Not on file    Attends meetings of clubs or organizations: Not on file    Relationship status: Not on file  . Intimate partner violence    Fear of current or ex partner: Not on file    Emotionally abused: Not on file    Physically abused: Not on file    Forced sexual activity: Not on file  Other Topics Concern  . Not on file  Social History Narrative   Patient is retired Quarry manager. Lives with wife   Caffeine coffee 1 cup    Family History  Problem Relation Age of Onset  . Diabetes Mother   . Hyperlipidemia Mother   . Hypertension Mother   . Cancer Father        lung  . Heart disease Sister     Past Medical History:  Diagnosis Date  . Diabetes mellitus without  complication ([place])   . H/O echocardiogram 09/2017   normal EF  . Headache   . Hyperlipidemia   . Hypertension   . Light headedness   . OSA on CPAP   . Palpitations   . Tingling    toes  . Trigeminal neuralgia   . Trigeminal neuralgia   . Trigeminal neuralgia   . Weakness     Patient Active Problem List   Diagnosis Date Noted  . OSA (obstructive sleep apnea) 12/05/2017  . GAD (generalized anxiety disorder) 11/24/2017  . Leg cramps, sleep related 11/24/2017  . Hypertension   . Hypertension associated with diabetes ([place]) 03/29/2017  . Diabetes mellitus without complication ([place]) [id number]  . Hyperlipidemia associated with type 2 diabetes mellitus ([place]) 03/29/2017  . Diverticulosis of large intestine without hemorrhage 03/29/2017  . History of prostatitis 03/29/2017  . Light headedness   . Tingling   . Weakness   . Trigeminal neuralgia     Past Surgical History:  Procedure Laterality Date  . NO PAST SURGERIES      Current Outpatient Medications  Medication Sig Dispense Refill  . buPROPion (WELLBUTRIN XL) 150 MG 24 hr tablet Take 1-2 tablets (150-300 mg total) by mouth daily. 60 tablet 5  . empagliflozin (JARDIANCE) 25 MG TABS tablet Take 25 mg by mouth daily. 30 tablet 5  . enalapril (VASOTEC) 20 MG tablet Take 1 tablet (20 mg total) by mouth 2 (two) times daily. 180 tablet 1  . ezetimibe (ZETIA) 10 MG tablet Take 1 tablet (10 mg total) by mouth daily. (Needs to be seen before next refill) 90 tablet 1  . gabapentin (NEURONTIN) 300 MG capsule Take 1 capsule (300 mg total) by mouth 3 (three) times daily. 270 capsule 3  . ibuprofen (ADVIL) 800 MG tablet Take 1 tablet (800 mg total) by mouth daily. 90 tablet 5  . metFORMIN (GLUCOPHAGE) 500 MG tablet Take 1 tablet (500 mg total) by mouth 2 (two) times daily. 60 tablet 5  . oxcarbazepine (TRILEPTAL) 600 MG tablet TAKE 1 TABLET BY MOUTH THREE TIMES DAILY 90 tablet 0  . pantoprazole (PROTONIX) 40 MG tablet Take 1 tablet (40 mg  total) by mouth daily. (Patient taking differently: Take 40 mg by mouth daily as needed (heartburn). ) 30 tablet 11  . rOPINIRole (REQUIP) 0.25 MG tablet TAKE 1 TO 2 TABLETS BY MOUTH AT BEDTIME FOR  RESTLESS  LEGS 180 tablet 0  . traMADol (ULTRAM) 50 MG tablet Take 1 tablet (50 mg total) by mouth daily. 60 tablet 5  . oxyCODONE-acetaminophen (  PERCOCET) 10-325 MG tablet Take 1 tablet by mouth every 6 (six) hours as needed for pain. 20 tablet 0  . pravastatin (PRAVACHOL) 20 MG tablet Take 1 tablet (20 mg total) by mouth daily. (Patient not taking: Reported on 02/21/2019) 90 tablet 1   No current facility-administered medications for this visit.     Allergies as of 02/21/2019 - Review Complete 02/21/2019  Allergen Reaction Noted  . Morphine and related Other (See Comments) 06/21/2013    Vitals: BP 114/71   Pulse 90   Temp (!) 97.1 F (36.2 C)   Ht 6\' 1"  (1.854 m)   Wt (!) 302 lb 9.6 oz (137.3 kg)   BMI 39.92 kg/m  Last Weight:  Wt Readings from Last 1 Encounters:  02/21/19 (!) 302 lb 9.6 oz (137.3 kg)   Last Height:   Ht Readings from Last 1 Encounters:  02/21/19 6\' 1"  (1.854 m)     Physical exam: Exam: Gen: NAD, conversant, well nourised,morbidly obese, well groomed                     CV: RRR, no MRG. No Carotid Bruits. No peripheral edema, warm, nontender Eyes: Conjunctivae clear without exudates or hemorrhage  Neuro: Detailed Neurologic Exam  Speech:    Speech is normal; fluent and spontaneous with normal comprehension.  Cognition:    The patient is oriented to person, place, and time;     recent and remote memory intact;     language fluent;     normal attention, concentration,     fund of knowledge Cranial Nerves:    The pupils are equal, round, and reactive to light.  Attempted funduscopy could not visualize due to small pupils.  Visual fields are full to finger confrontation. Extraocular movements are intact. Trigeminal sensation is intact and the muscles of  mastication are normal. The face is symmetric. The palate elevates in the midline. Hearing intact. Voice is normal. Shoulder shrug is normal. The tongue has normal motion without fasciculations.   Coordination:    No dysmetria  Gait:    Normal native gait  Motor Observation:    No asymmetry, no atrophy, and no involuntary movements noted. Tone:    Normal muscle tone.    Posture:    Posture is normal. normal erect    Strength:    Strength is V/V in the upper and lower limbs.      Sensation: intact to LT     Reflex Exam:  DTR's:    Deep tendon reflexes in the upper and lower extremities are symmetrical bilaterally.   Toes:    The toes are equivocal bilaterally.   Clonus:    Clonus is absent.    Assessment/Plan: This is a 53 year old male with 10 years of trigeminal neuralgia, severe.  He had imaging possibly, many years ago.  But since then MRIs have become more sensitive and I feel as though we need an MRI of the brain trigeminal protocol to assess for vascular loop, multiple sclerosis, compressive lesion or mass or other that could be causing problems with the trigeminal nerve.  Would be ideal to have surgical option or discussed other treatment such as gamma knife and referral to [place] and Gabapentin - Mri brain trigeminal protocol, [facility] open MRI, he declines contrast He has not been taking gabapentin 3x a day, he will start taking it 3x a da again and if needed we can increase to 400mg  tid. Discussed other  medications we can use such as Tegretol, baclofen and others.  -Patient denies having any opioids from other providers, the pain of trigeminal neuralgia can be severe, it is one of the most painful headache disorders.  I will give him one prescription of Percocet to be used only if the pain is so severe it may help him from going to the emergency room.  Patient agrees.  Discussed the risks including addiction, respiratory depression and others  and he understands.   Orders Placed This Encounter  Procedures  . MR FACE/TRIGEMINAL WO CM   Meds ordered this encounter  Medications  . oxyCODONE-acetaminophen (PERCOCET) 10-325 MG tablet    Sig: Take 1 tablet by mouth every 6 (six) hours as needed for pain.    Dispense:  20 tablet    Refill:  0    Cc: Terald Sleeper, PA-C,    Sarina Ill, MD  [facility] [address] [place], [place] [address]  Phone [phone] Fax [phone]

## 2019-02-21 NOTE — Patient Instructions (Signed)
MR of the brain trigeminal protocol Percocet for severe pain Referral to [facility] pending results of mri Gabapentin three times a day  Acetaminophen; Oxycodone tablets What is this medicine? ACETAMINOPHEN; OXYCODONE (a set a MEE noe fen; ox i KOE done) is a pain reliever. It is used to treat moderate to severe pain. This medicine may be used for other purposes; ask your health care provider or pharmacist if you have questions. COMMON BRAND NAME(S): Endocet, Magnacet, Nalocet, Narvox, Percocet, Perloxx, Primalev, Primlev, Roxicet, Xolox What should I tell my health care provider before I take this medicine? They need to know if you have any of these conditions:  brain tumor  Crohn's disease, inflammatory bowel disease, or ulcerative colitis  drug abuse or addiction  head injury  heart or circulation problems  if you often drink alcohol  kidney disease or problems going to the bathroom  liver disease  lung disease, asthma, or breathing problems  an unusual or allergic reaction to acetaminophen, oxycodone, other opioid analgesics, other medicines, foods, dyes, or preservatives  pregnant or trying to get pregnant  breast-feeding How should I use this medicine? Take this medicine by mouth with a full glass of water. Follow the directions on the prescription label. You can take it with or without food. If it upsets your stomach, take it with food. Take your medicine at regular intervals. Do not take it more often than directed. A special MedGuide will be given to you by the pharmacist with each prescription and refill. Be sure to read this information carefully each time. Talk to your pediatrician regarding the use of this medicine in children. Special care may be needed. Overdosage: If you think you have taken too much of this medicine contact a poison control center or emergency room at once. NOTE: This medicine is only for you. Do not share this medicine with others. What if  I miss a dose? If you miss a dose, take it as soon as you can. If it is almost time for your next dose, take only that dose. Do not take double or extra doses. What may interact with this medicine? This medicine may interact with the following medications:  alcohol  antihistamines for allergy, cough and cold  antiviral medicines for HIV or AIDS  atropine  certain antibiotics like clarithromycin, erythromycin, linezolid, rifampin  certain medicines for anxiety or sleep  certain medicines for bladder problems like oxybutynin, tolterodine  certain medicines for depression like amitriptyline, fluoxetine, sertraline  certain medicines for fungal infections like ketoconazole, itraconazole, voriconazole  certain medicines for migraine headache like almotriptan, eletriptan, frovatriptan, naratriptan, rizatriptan, sumatriptan, zolmitriptan  certain medicines for nausea or vomiting like dolasetron, ondansetron, palonosetron  certain medicines for Parkinson's disease like benztropine, trihexyphenidyl  certain medicines for seizures like phenobarbital, phenytoin, primidone  certain medicines for stomach problems like dicyclomine, hyoscyamine  certain medicines for travel sickness like scopolamine  diuretics  general anesthetics like halothane, isoflurane, methoxyflurane, propofol  ipratropium  local anesthetics like lidocaine, pramoxine, tetracaine  MAOIs like Carbex, Eldepryl, Marplan, Nardil, and Parnate  medicines that relax muscles for surgery  methylene blue  nilotinib  other medicines with acetaminophen  other narcotic medicines for pain or cough  phenothiazines like chlorpromazine, mesoridazine, prochlorperazine, thioridazine This list may not describe all possible interactions. Give your health care provider a list of all the medicines, herbs, non-prescription drugs, or dietary supplements you use. Also tell them if you smoke, drink alcohol, or use illegal drugs.  Some items may interact  with your medicine. What should I watch for while using this medicine? Tell your doctor or health care professional if your pain does not go away, if it gets worse, or if you have new or a different type of pain. You may develop tolerance to the medicine. Tolerance means that you will need a higher dose of the medication for pain relief. Tolerance is normal and is expected if you take this medicine for a long time. Do not suddenly stop taking your medicine because you may develop a severe reaction. Your body becomes used to the medicine. This does NOT mean you are addicted. Addiction is a behavior related to getting and using a drug for a non-medical reason. If you have pain, you have a medical reason to take pain medicine. Your doctor will tell you how much medicine to take. If your doctor wants you to stop the medicine, the dose will be slowly lowered over time to avoid any side effects. There are different types of narcotic medicines (opiates). If you take more than one type at the same time or if you are taking another medicine that also causes drowsiness, you may have more side effects. Give your health care provider a list of all medicines you use. Your doctor will tell you how much medicine to take. Do not take more medicine than directed. Call emergency for help if you have problems breathing or unusual sleepiness. Do not take other medicines that contain acetaminophen with this medicine. Always read labels carefully. If you have questions, ask your doctor or pharmacist. If you take too much acetaminophen get medical help right away. Too much acetaminophen can be very dangerous and cause liver damage. Even if you do not have symptoms, it is important to get help right away. You may get drowsy or dizzy. Do not drive, use machinery, or do anything that needs mental alertness until you know how this medicine affects you. Do not stand or sit up quickly, especially if you are an  older patient. This reduces the risk of dizzy or fainting spells. Alcohol may interfere with the effect of this medicine. Avoid alcoholic drinks. The medicine will cause constipation. Try to have a bowel movement at least every 2 to 3 days. If you do not have a bowel movement for 3 days, call your doctor or health care professional. Your mouth may get dry. Chewing sugarless gum or sucking hard candy, and drinking plenty or water may help. Contact your doctor if the problem does not go away or is severe. What side effects may I notice from receiving this medicine? Side effects that you should report to your doctor or health care professional as soon as possible:  allergic reactions like skin rash, itching or hives, swelling of the face, lips, or tongue  breathing problems  confusion  redness, blistering, peeling or loosening of the skin, including inside the mouth  signs and symptoms of liver injury like dark yellow or brown urine; general ill feeling or flu-like symptoms; light-colored stools; loss of appetite; nausea; right upper belly pain; unusually weak or tired; yellowing of the eyes or skin  signs and symptoms of low blood pressure like dizziness; feeling faint or lightheaded, falls; unusually weak or tired  trouble passing urine or change in the amount of urine Side effects that usually do not require medical attention (report to your doctor or health care professional if they continue or are bothersome):  constipation  dry mouth  nausea, vomiting  tiredness This list may  not describe all possible side effects. Call your doctor for medical advice about side effects. You may report side effects to FDA at 1-800-FDA-1088. Where should I keep my medicine? Keep out of the reach of children. This medicine can be abused. Keep your medicine in a safe place to protect it from theft. Do not share this medicine with anyone. Selling or giving away this medicine is dangerous and against the  law. Store at room temperature between 20 and 25 degrees C (68 and 77 degrees F). This medicine may cause harm and death if it is taken by other adults, children, or pets. Return medicine that has not been used to an official disposal site. Contact the DEA at 978-803-8978 or your city/county government to find a site. If you cannot return the medicine, flush it down the toilet. Do not use the medicine after the expiration date. NOTE: This sheet is a summary. It may not cover all possible information. If you have questions about this medicine, talk to your doctor, pharmacist, or health care provider.  2020 Elsevier/Gold Standard (2016-09-20 15:46:38)

## 2019-02-23 ENCOUNTER — Encounter: Payer: Self-pay | Admitting: Neurology

## 2019-02-26 ENCOUNTER — Telehealth: Payer: Self-pay | Admitting: Neurology

## 2019-02-26 NOTE — Telephone Encounter (Signed)
[employer] Auth: [id number] (exp. 02/26/19 to 08/24/19) order faxed to [facility] they will reach out to the patient to schedule for open MRI.

## 2019-02-26 NOTE — Telephone Encounter (Signed)
For the diagnosis code Trigeminal Neuralgia. It will be a MRI Brain w/wo contrast and on the MRI order put attention the trigeminal neuralgia. When you get the chance can you switch the order to MRI [facility] w/wo contrast. Thank you!

## 2019-02-26 NOTE — Telephone Encounter (Signed)
Done. thanks

## 2019-02-26 NOTE — Addendum Note (Signed)
Addended by: Sarina Ill B on: 02/26/2019 01:09 PM   Modules accepted: Orders

## 2019-02-26 NOTE — Telephone Encounter (Signed)
[employer] Auth: [id number] (exp. 02/26/19 to 08/24/19) order faxed to [facility] they will reach out to the patient to schedule

## 2019-03-04 DIAGNOSIS — M5442 Lumbago with sciatica, left side: Secondary | ICD-10-CM | POA: Diagnosis not present

## 2019-03-04 DIAGNOSIS — M546 Pain in thoracic spine: Secondary | ICD-10-CM | POA: Diagnosis not present

## 2019-03-04 DIAGNOSIS — M47816 Spondylosis without myelopathy or radiculopathy, lumbar region: Secondary | ICD-10-CM | POA: Diagnosis not present

## 2019-03-06 NOTE — Telephone Encounter (Signed)
Larene Beach with [facility] informed me that they have tried to reach out to the patient 3 times and they also mailed out a letter about scheduling the MRI.

## 2019-03-08 ENCOUNTER — Telehealth: Payer: Self-pay | Admitting: Physician Assistant

## 2019-03-10 NOTE — Telephone Encounter (Signed)
There are orders in Other Orders from 11/2017

## 2019-03-10 NOTE — Telephone Encounter (Signed)
We need all the records and orders for this, I have not ordered it fir him that I am aware of

## 2019-03-11 ENCOUNTER — Other Ambulatory Visit: Payer: Self-pay | Admitting: Physician Assistant

## 2019-03-11 DIAGNOSIS — M47816 Spondylosis without myelopathy or radiculopathy, lumbar region: Secondary | ICD-10-CM | POA: Diagnosis not present

## 2019-03-11 DIAGNOSIS — M5442 Lumbago with sciatica, left side: Secondary | ICD-10-CM | POA: Diagnosis not present

## 2019-03-11 DIAGNOSIS — G4733 Obstructive sleep apnea (adult) (pediatric): Secondary | ICD-10-CM

## 2019-03-11 DIAGNOSIS — M546 Pain in thoracic spine: Secondary | ICD-10-CM | POA: Diagnosis not present

## 2019-03-11 NOTE — Telephone Encounter (Signed)
Order printed

## 2019-03-11 NOTE — Telephone Encounter (Signed)
Will need new orders, going to new company

## 2019-03-12 DIAGNOSIS — G4733 Obstructive sleep apnea (adult) (pediatric): Secondary | ICD-10-CM | POA: Diagnosis not present

## 2019-03-16 ENCOUNTER — Other Ambulatory Visit: Payer: Self-pay | Admitting: Physician Assistant

## 2019-03-16 DIAGNOSIS — G4762 Sleep related leg cramps: Secondary | ICD-10-CM

## 2019-03-18 DIAGNOSIS — M47816 Spondylosis without myelopathy or radiculopathy, lumbar region: Secondary | ICD-10-CM | POA: Diagnosis not present

## 2019-03-18 DIAGNOSIS — M5442 Lumbago with sciatica, left side: Secondary | ICD-10-CM | POA: Diagnosis not present

## 2019-03-18 DIAGNOSIS — M546 Pain in thoracic spine: Secondary | ICD-10-CM | POA: Diagnosis not present

## 2019-03-25 DIAGNOSIS — M546 Pain in thoracic spine: Secondary | ICD-10-CM | POA: Diagnosis not present

## 2019-03-25 DIAGNOSIS — M5442 Lumbago with sciatica, left side: Secondary | ICD-10-CM | POA: Diagnosis not present

## 2019-03-25 DIAGNOSIS — M47816 Spondylosis without myelopathy or radiculopathy, lumbar region: Secondary | ICD-10-CM | POA: Diagnosis not present

## 2019-04-17 ENCOUNTER — Other Ambulatory Visit: Payer: Self-pay

## 2019-04-17 DIAGNOSIS — M546 Pain in thoracic spine: Secondary | ICD-10-CM | POA: Diagnosis not present

## 2019-04-17 DIAGNOSIS — M5442 Lumbago with sciatica, left side: Secondary | ICD-10-CM | POA: Diagnosis not present

## 2019-04-17 DIAGNOSIS — Z20822 Contact with and (suspected) exposure to covid-19: Secondary | ICD-10-CM

## 2019-04-17 DIAGNOSIS — M47816 Spondylosis without myelopathy or radiculopathy, lumbar region: Secondary | ICD-10-CM | POA: Diagnosis not present

## 2019-04-18 ENCOUNTER — Telehealth: Payer: Self-pay | Admitting: Physician Assistant

## 2019-04-18 LAB — NOVEL CORONAVIRUS, NAA: SARS-CoV-2, NAA: NOT DETECTED

## 2019-04-18 NOTE — Telephone Encounter (Signed)
Rx from 03/11/19 refaxed to [facility]

## 2019-04-19 DIAGNOSIS — M5442 Lumbago with sciatica, left side: Secondary | ICD-10-CM | POA: Diagnosis not present

## 2019-04-19 DIAGNOSIS — M546 Pain in thoracic spine: Secondary | ICD-10-CM | POA: Diagnosis not present

## 2019-04-19 DIAGNOSIS — M47816 Spondylosis without myelopathy or radiculopathy, lumbar region: Secondary | ICD-10-CM | POA: Diagnosis not present

## 2019-04-22 ENCOUNTER — Other Ambulatory Visit: Payer: Self-pay | Admitting: Physician Assistant

## 2019-04-22 DIAGNOSIS — E119 Type 2 diabetes mellitus without complications: Secondary | ICD-10-CM

## 2019-04-24 DIAGNOSIS — M5442 Lumbago with sciatica, left side: Secondary | ICD-10-CM | POA: Diagnosis not present

## 2019-04-24 DIAGNOSIS — M546 Pain in thoracic spine: Secondary | ICD-10-CM | POA: Diagnosis not present

## 2019-04-24 DIAGNOSIS — M47816 Spondylosis without myelopathy or radiculopathy, lumbar region: Secondary | ICD-10-CM | POA: Diagnosis not present

## 2019-05-03 DIAGNOSIS — M5442 Lumbago with sciatica, left side: Secondary | ICD-10-CM | POA: Diagnosis not present

## 2019-05-03 DIAGNOSIS — M546 Pain in thoracic spine: Secondary | ICD-10-CM | POA: Diagnosis not present

## 2019-05-03 DIAGNOSIS — M47816 Spondylosis without myelopathy or radiculopathy, lumbar region: Secondary | ICD-10-CM | POA: Diagnosis not present

## 2019-05-06 IMAGING — DX DG SHOULDER 2+V*R*
3 series · 3 of 3 positions shown · non-contrast
Comparison: No prior.

CLINICAL DATA: Acute pain right shoulder.

EXAM:
RIGHT SHOULDER - 2+ VIEW

[shoulder ap]
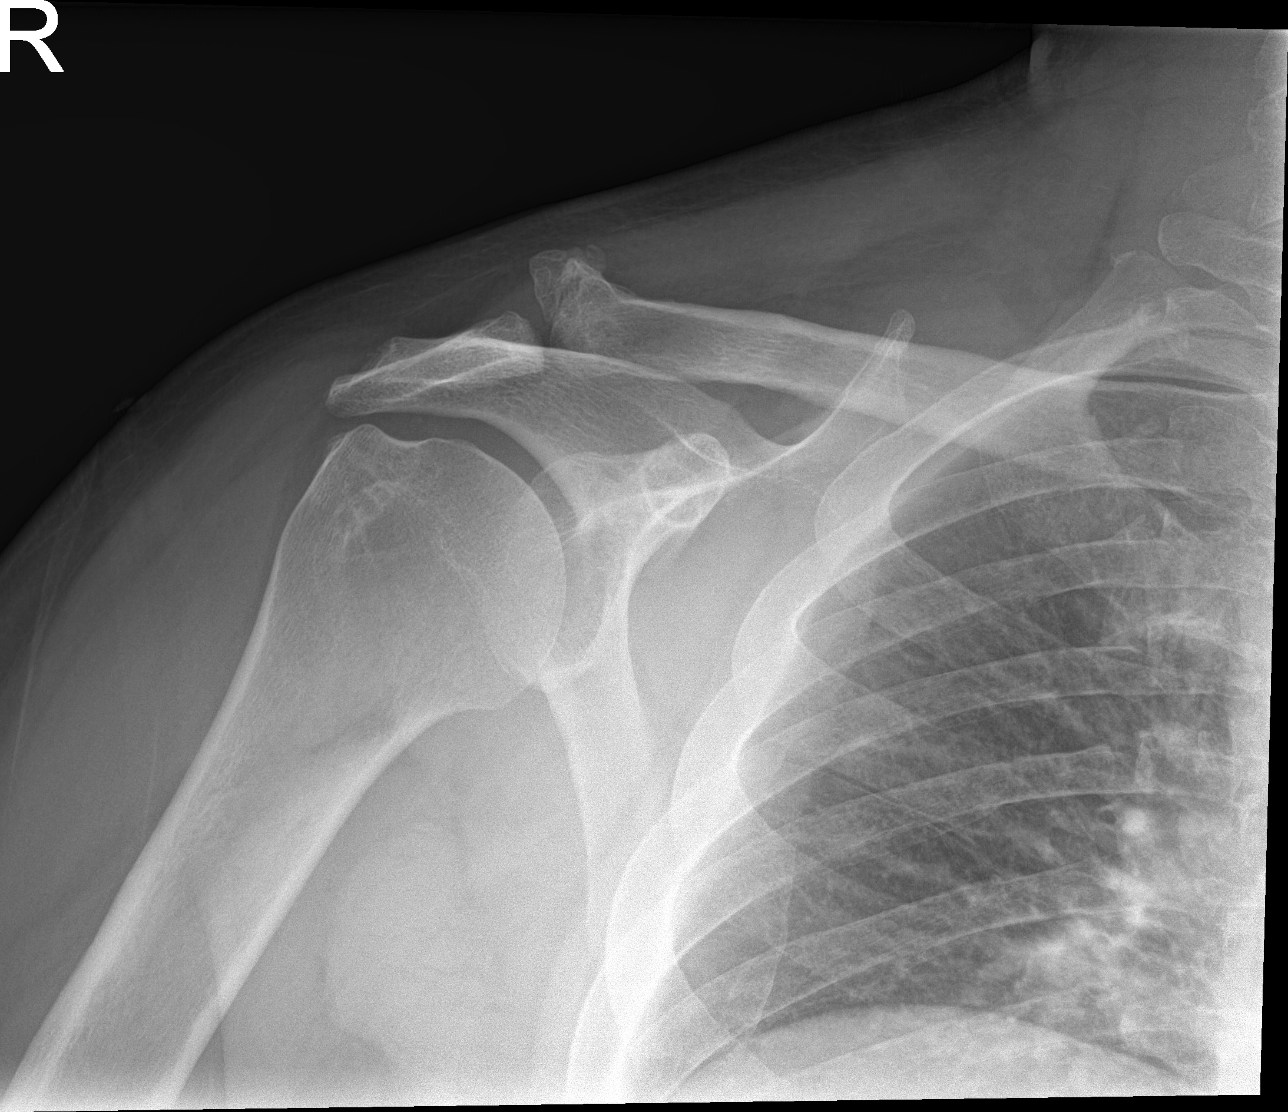

[shoulder obl]
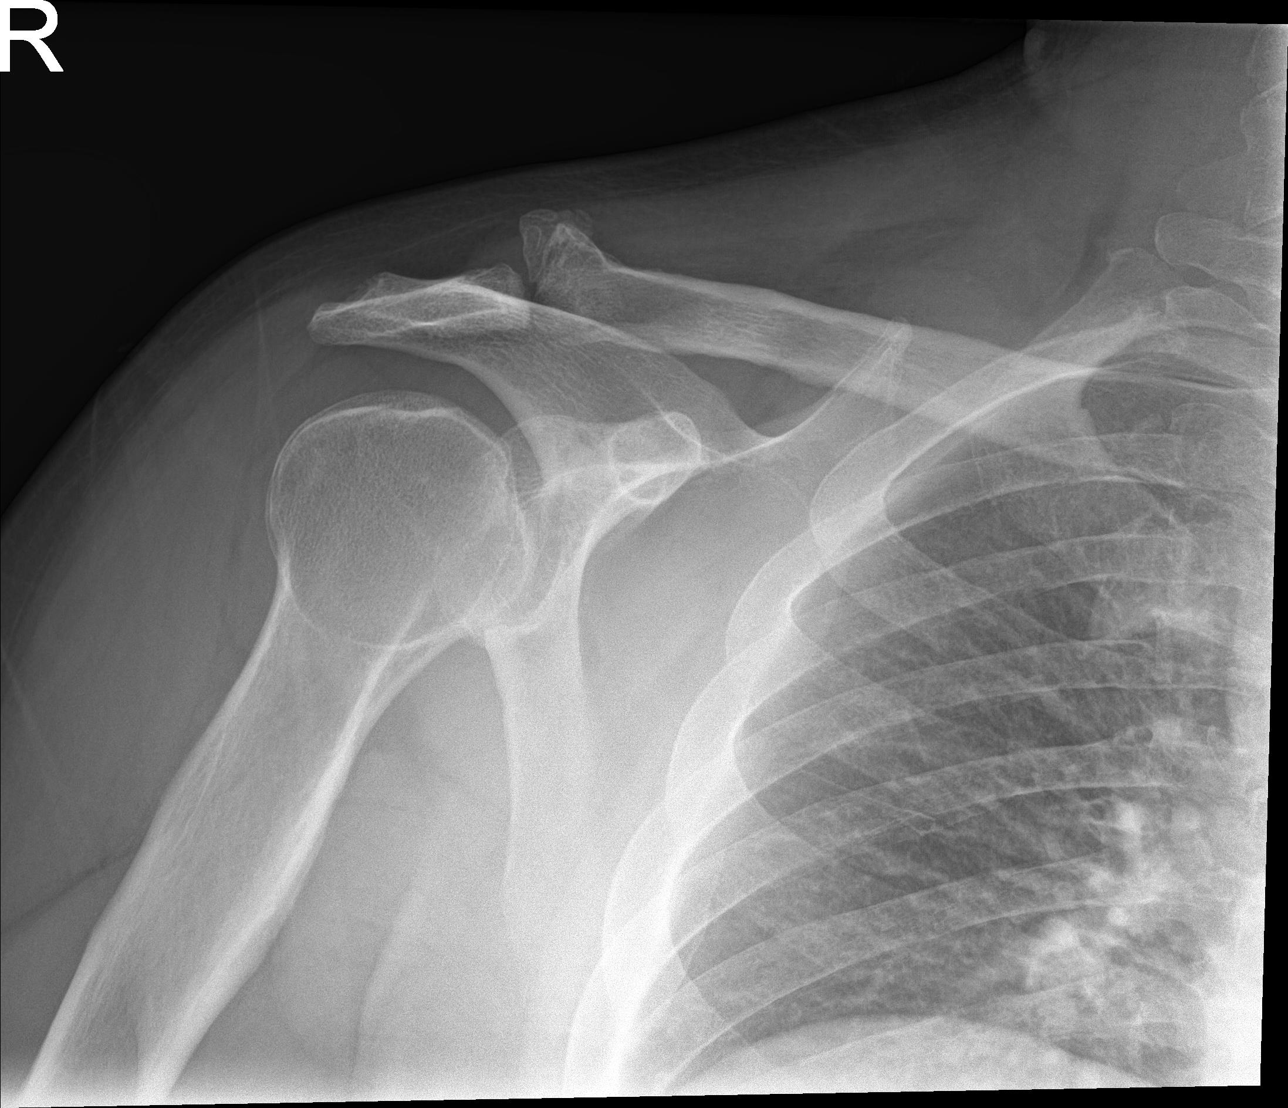

[shoulder axial]
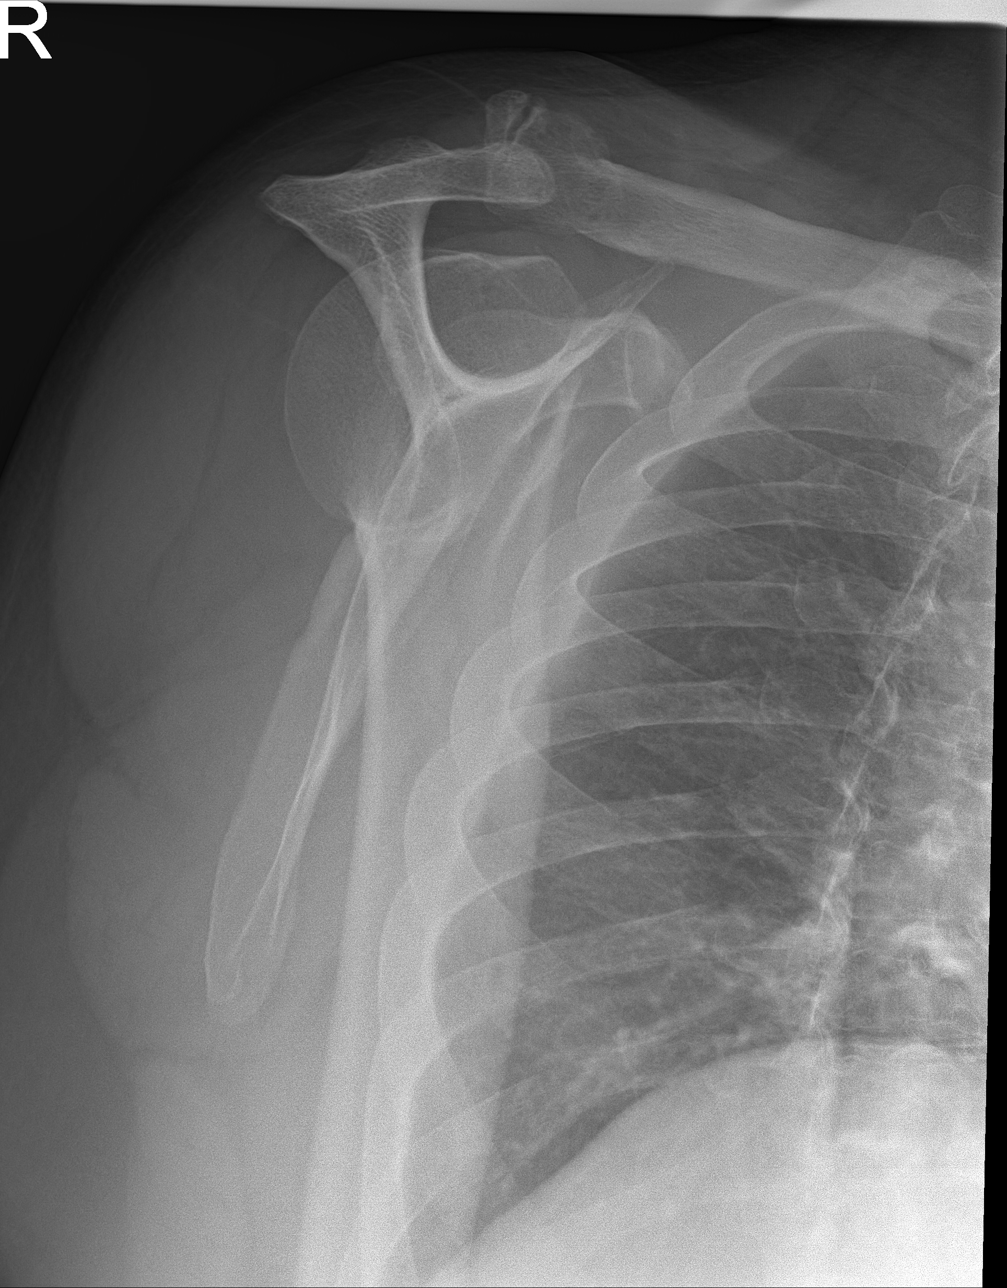

[3 of 3 positions shown; findings below may reference images not displayed]

FINDINGS: Acromioclavicular and glenohumeral degenerative change. Prominent
acromioclavicular spurring. Downsloping acromion. No acute bony
abnormality identified. No evidence of fracture or dislocation.
IMPRESSION: Acromioclavicular and glenohumeral degenerative change. Prominent
acromioclavicular spurring. Downsloping acromion. No acute
abnormality.

## 2019-05-10 DIAGNOSIS — M47816 Spondylosis without myelopathy or radiculopathy, lumbar region: Secondary | ICD-10-CM | POA: Diagnosis not present

## 2019-05-10 DIAGNOSIS — M546 Pain in thoracic spine: Secondary | ICD-10-CM | POA: Diagnosis not present

## 2019-05-10 DIAGNOSIS — M5442 Lumbago with sciatica, left side: Secondary | ICD-10-CM | POA: Diagnosis not present

## 2019-06-07 ENCOUNTER — Other Ambulatory Visit: Payer: Self-pay | Admitting: Physician Assistant

## 2019-06-07 DIAGNOSIS — G500 Trigeminal neuralgia: Secondary | ICD-10-CM

## 2019-06-11 ENCOUNTER — Other Ambulatory Visit: Payer: Self-pay | Admitting: Physician Assistant

## 2019-06-11 DIAGNOSIS — E1159 Type 2 diabetes mellitus with other circulatory complications: Secondary | ICD-10-CM

## 2019-06-11 DIAGNOSIS — I152 Hypertension secondary to endocrine disorders: Secondary | ICD-10-CM

## 2019-06-24 DIAGNOSIS — Z0271 Encounter for disability determination: Secondary | ICD-10-CM

## 2019-06-26 ENCOUNTER — Other Ambulatory Visit: Payer: Self-pay | Admitting: Physician Assistant

## 2019-06-26 DIAGNOSIS — E785 Hyperlipidemia, unspecified: Secondary | ICD-10-CM

## 2019-06-26 DIAGNOSIS — E1169 Type 2 diabetes mellitus with other specified complication: Secondary | ICD-10-CM

## 2019-07-06 ENCOUNTER — Other Ambulatory Visit: Payer: Self-pay | Admitting: Physician Assistant

## 2019-07-06 DIAGNOSIS — E119 Type 2 diabetes mellitus without complications: Secondary | ICD-10-CM

## 2019-07-15 ENCOUNTER — Other Ambulatory Visit: Payer: Self-pay | Admitting: Physician Assistant

## 2019-07-15 DIAGNOSIS — G4762 Sleep related leg cramps: Secondary | ICD-10-CM

## 2019-07-15 DIAGNOSIS — I152 Hypertension secondary to endocrine disorders: Secondary | ICD-10-CM

## 2019-07-15 DIAGNOSIS — G500 Trigeminal neuralgia: Secondary | ICD-10-CM

## 2019-07-15 DIAGNOSIS — E1159 Type 2 diabetes mellitus with other circulatory complications: Secondary | ICD-10-CM

## 2019-07-29 ENCOUNTER — Other Ambulatory Visit: Payer: Self-pay | Admitting: Physician Assistant

## 2019-07-29 DIAGNOSIS — E1159 Type 2 diabetes mellitus with other circulatory complications: Secondary | ICD-10-CM

## 2019-07-29 DIAGNOSIS — G500 Trigeminal neuralgia: Secondary | ICD-10-CM

## 2019-07-29 DIAGNOSIS — I152 Hypertension secondary to endocrine disorders: Secondary | ICD-10-CM

## 2019-07-29 DIAGNOSIS — E119 Type 2 diabetes mellitus without complications: Secondary | ICD-10-CM

## 2019-07-30 ENCOUNTER — Other Ambulatory Visit: Payer: Self-pay | Admitting: Physician Assistant

## 2019-07-30 ENCOUNTER — Telehealth: Payer: Self-pay | Admitting: Physician Assistant

## 2019-07-30 DIAGNOSIS — G500 Trigeminal neuralgia: Secondary | ICD-10-CM

## 2019-07-30 DIAGNOSIS — E1169 Type 2 diabetes mellitus with other specified complication: Secondary | ICD-10-CM

## 2019-07-30 DIAGNOSIS — E785 Hyperlipidemia, unspecified: Secondary | ICD-10-CM

## 2019-07-30 MED ORDER — METFORMIN HCL 500 MG PO TABS: 500.0000 mg | ORAL_TABLET | Freq: Two times a day (BID) | ORAL | 0 refills | Status: DC

## 2019-07-30 MED ORDER — ENALAPRIL MALEATE 20 MG PO TABS: 20.0000 mg | ORAL_TABLET | Freq: Two times a day (BID) | ORAL | 0 refills | Status: DC

## 2019-07-30 MED ORDER — JARDIANCE 25 MG PO TABS: 25.0000 mg | ORAL_TABLET | Freq: Every day | ORAL | 0 refills | Status: DC

## 2019-07-30 NOTE — Telephone Encounter (Signed)
Appt made / some refills sent to get him through

## 2019-07-30 NOTE — Telephone Encounter (Signed)
Please advise on future lab orders.

## 2019-07-30 NOTE — Telephone Encounter (Signed)
[facility]. [employer] 30 days given 06/26/19

## 2019-07-30 NOTE — Telephone Encounter (Signed)
I did need to have an appointment in office in order to update contract for controlled medication, Ultram.  There also was already an order placed last summer for him to come in for fasting labs.  At that time we will also collect a urine.

## 2019-07-30 NOTE — Telephone Encounter (Signed)
Aware. 

## 2019-07-30 NOTE — Addendum Note (Signed)
Addended by: Zannie Cove on: 07/30/2019 08:48 AM   Modules accepted: Orders

## 2019-08-07 ENCOUNTER — Ambulatory Visit: Payer: BC Managed Care – PPO | Admitting: Physician Assistant

## 2019-08-20 ENCOUNTER — Ambulatory Visit: Payer: Self-pay | Admitting: Physician Assistant

## 2019-08-22 ENCOUNTER — Encounter: Payer: Self-pay | Admitting: Family Medicine

## 2019-08-22 NOTE — Progress Notes (Signed)
Assessment & Plan:  1. Diabetes mellitus without complication (HCC) Lab Results  Component Value Date   HGBA1C 7.7 (H) 08/23/2019   HGBA1C 6.9 04/20/2018   HGBA1C 6.8 07/03/2017  - Diabetes is not at goal of A1c < 7. - Medications: continue jardiance 25 mg QD; increase metformin from 500 mg BID to 1,000 mg in the AM and 500 mg in the PM - Home glucose monitoring: continue fasting and as needed - Patient is not currently taking a statin. Patient is taking an ACE-inhibitor/ARB.  - Last foot exam: 08/23/2019 - Last diabetic eye exam: unknown - Urine Microalbumin/Creat Ratio: 08/23/2019 - Instruction/counseling given: discussed foot care, discussed the need for weight loss and discussed diet - CMP14+EGFR - Lipid panel - Bayer DCA Hb A1c Waived - Microalbumin / creatinine urine ratio - metFORMIN (GLUCOPHAGE) 500 MG tablet; Take 2 tablets (1,000 mg total) by mouth daily with breakfast AND 1 tablet (500 mg total) every evening.  Dispense: 270 tablet; Refill: 0 - empagliflozin (JARDIANCE) 25 MG TABS tablet; Take 25 mg by mouth daily. (Needs to be seen before next refill)  Dispense: 90 tablet; Refill: 0  2. Hyperlipidemia associated with type 2 diabetes mellitus (HCC) - CMP14+EGFR - Lipid panel - ezetimibe (ZETIA) 10 MG tablet; Take 1 tablet (10 mg total) by mouth daily.  Dispense: 90 tablet; Refill: 1  3. Hypertension associated with diabetes (HCC) - Well controlled on current regimen.  - enalapril (VASOTEC) 20 MG tablet; Take 1 tablet (20 mg total) by mouth 2 (two) times daily.  Dispense: 180 tablet; Refill: 1  4. Trigeminal neuralgia - Well controlled on current regimen.  - gabapentin (NEURONTIN) 300 MG capsule; Take 1 capsule (300 mg total) by mouth 3 (three) times daily.  Dispense: 270 capsule; Refill: 1 - oxcarbazepine (TRILEPTAL) 600 MG tablet; Take 1 tablet (600 mg total) by mouth 3 (three) times daily.  Dispense: 270 tablet; Refill: 1 - traMADol (ULTRAM) 50 MG tablet; Take 1  tablet (50 mg total) by mouth daily.  Dispense: 30 tablet; Refill: 2 - Compliance Drug Analysis, Ur  5. Controlled substance agreement signed - Compliance Drug Analysis, Ur  6. Leg cramps, sleep related - Well controlled on current regimen.  - rOPINIRole (REQUIP) 0.25 MG tablet; TAKE 1 TO 2 TABLETS BY MOUTH AT BEDTIME FOR  RESTLESS  LEGS  Dispense: 180 tablet; Refill: 1  7. Gastroesophageal reflux disease, unspecified whether esophagitis present - Well controlled on current regimen.  - pantoprazole (PROTONIX) 40 MG tablet; Take 1 tablet (40 mg total) by mouth daily.  Dispense: 90 tablet; Refill: 1  8. Moderate recurrent major depression (HCC) - Patient reports he has tried a few medications in the past and does not want to try another one right now. He tries to deal with what he has going on and knows he would never harm himself. I offered GeneSight testing but he declined at this time.   9. Screening for deficiency anemia - CBC with Differential/Platelet   Return in about 3 months (around 11/23/2019) for DM (with PCP).  Deliah Boston, MSN, APRN, FNP-C [facility]  Subjective:    Patient ID: Matthew Pennington, male    DOB: December 05, 1965, 54 y.o.   MRN: [id number]  Patient Care Team: Junie Spencer, FNP as PCP - General (Family Medicine) Laqueta Linden, MD as PCP - Cardiology (Cardiology)   Chief Complaint:  Chief Complaint  Patient presents with  . Medical Management of Chronic Issues    6  month follow up of medical problems     HPI: Matthew Pennington is a 54 y.o. male presenting on 08/23/2019 for Medical Management of Chronic Issues (6 month follow up of medical problems )  Diabetes: Patient presents for follow up of diabetes. Current symptoms include: polyuria. Symptoms have gradually worsened. Known diabetic complications: none. Medication compliance: yes. Current diet: eats lots of sandwiches. Current exercise: none. Home blood sugar records: BGs are  running  consistent with Hgb A1C. Is he  on ACE inhibitor or angiotensin II receptor blocker? Yes. Is he on a statin? No - unable to tolerate.   Lab Results  Component Value Date   HGBA1C 7.7 (H) 08/23/2019   HGBA1C 6.9 04/20/2018   HGBA1C 6.8 07/03/2017   Lab Results  Component Value Date   LDLCALC 130 (H) 04/20/2018   CREATININE 0.96 07/07/2018     Pain assessment: Cause of pain- trigeminal neuralgia Pain location- face  Current opioids rx- Tramadol 50 mg PO QD PRN.  # meds rx- 30 Effectiveness of current meds- effective Adverse reactions form pain meds- none Morphine equivalent- 5 MME/day  Pill count performed-No Last drug screen - not completed - ordering today ( high risk q27m, moderate risk q71m, low risk yearly ) Urine drug screen today- Yes Was the NCCSR reviewed- yes  If yes were their any concerning findings? - no  Overdose risk: 270  Opioid Risk  08/23/2019  Alcohol 0  Illegal Drugs 0  Rx Drugs 0  Alcohol 0  Illegal Drugs 0  Rx Drugs 0  Age between 16-45 years  1  Psychological Disease 0  Depression 1  Opioid Risk Tool Scoring 2  Opioid Risk Interpretation Low Risk   Pain contract signed on: 08/23/2019  New complaints: None  Social history:  Relevant past medical, surgical, family and social history reviewed and updated as indicated. Interim medical history since our last visit reviewed.  Allergies and medications reviewed and updated.  DATA REVIEWED: CHART IN EPIC  ROS: Negative unless specifically indicated above in HPI.    Current Outpatient Medications:  .  empagliflozin (JARDIANCE) 25 MG TABS tablet, Take 25 mg by mouth daily. (Needs to be seen before next refill), Disp: 90 tablet, Rfl: 0 .  enalapril (VASOTEC) 20 MG tablet, Take 1 tablet (20 mg total) by mouth 2 (two) times daily., Disp: 180 tablet, Rfl: 1 .  ezetimibe (ZETIA) 10 MG tablet, Take 1 tablet (10 mg total) by mouth daily., Disp: 90 tablet, Rfl: 1 .  gabapentin (NEURONTIN) 300  MG capsule, Take 1 capsule (300 mg total) by mouth 3 (three) times daily., Disp: 270 capsule, Rfl: 1 .  ibuprofen (ADVIL) 800 MG tablet, Take 1 tablet (800 mg total) by mouth daily., Disp: 90 tablet, Rfl: 5 .  metFORMIN (GLUCOPHAGE) 500 MG tablet, Take 2 tablets (1,000 mg total) by mouth daily with breakfast AND 1 tablet (500 mg total) every evening., Disp: 270 tablet, Rfl: 0 .  oxcarbazepine (TRILEPTAL) 600 MG tablet, Take 1 tablet (600 mg total) by mouth 3 (three) times daily., Disp: 270 tablet, Rfl: 1 .  pantoprazole (PROTONIX) 40 MG tablet, Take 1 tablet (40 mg total) by mouth daily., Disp: 90 tablet, Rfl: 1 .  rOPINIRole (REQUIP) 0.25 MG tablet, TAKE 1 TO 2 TABLETS BY MOUTH AT BEDTIME FOR  RESTLESS  LEGS, Disp: 180 tablet, Rfl: 1 .  traMADol (ULTRAM) 50 MG tablet, Take 1 tablet (50 mg total) by mouth daily., Disp: 30 tablet, Rfl: 2   Allergies  Allergen Reactions  . Morphine And Related Other (See Comments)    "Makes me go Crazy"  . Statins Other (See Comments)    Joint/mucles pains   Past Medical History:  Diagnosis Date  . Diabetes mellitus without complication (HCC)   . H/O echocardiogram 09/2017   normal EF  . Headache   . Hyperlipidemia   . Hypertension   . Light headedness   . OSA on CPAP   . Palpitations   . Tingling    toes  . Trigeminal neuralgia     Past Surgical History:  Procedure Laterality Date  . NO PAST SURGERIES      Social History   Socioeconomic History  . Marital status: Married    Spouse name: Lambert Mody  . Number of children: 3  . Years of education: Not on file  . Highest education level: Associate degree: academic program  Occupational History  . Not on file  Tobacco Use  . Smoking status: Never Smoker  . Smokeless tobacco: Never Used  Substance and Sexual Activity  . Alcohol use: No  . Drug use: No  . Sexual activity: Not on file  Other Topics Concern  . 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 Difficulty of Paying Living Expenses:   Food Insecurity:   . Worried About Programme researcher, broadcasting/film/video in the Last Year:   . Barista in the Last Year:   Transportation Needs:   . Freight forwarder (Medical):   Marland Kitchen Lack of Transportation (Non-Medical):   Physical Activity:   . Days of Exercise per Week:   . Minutes of Exercise per Session:   Stress:   . Feeling of Stress :   Social Connections:   . Frequency of Communication with Friends and Family:   . Frequency of Social Gatherings with Friends and Family:   . Attends Religious Services:   . Active Member of Clubs or Organizations:   . Attends Banker Meetings:   Marland Kitchen Marital Status:   Intimate Partner Violence:   . Fear of Current or Ex-Partner:   . Emotionally Abused:   Marland Kitchen Physically Abused:   . Sexually Abused:         Objective:    BP 112/75   Pulse (!) 58   Temp 97.8 F (36.6 C) (Temporal)   Ht 6\' 1"  (1.854 m)   Wt (!) 302 lb 9.6 oz (137.3 kg)   SpO2 94%   BMI 39.92 kg/m   Wt Readings from Last 3 Encounters:  08/23/19 (!) 302 lb 9.6 oz (137.3 kg)  02/21/19 (!) 302 lb 9.6 oz (137.3 kg)  07/07/18 295 lb (133.8 kg)    Physical Exam Vitals reviewed.  Constitutional:      General: He is not in acute distress.    Appearance: Normal appearance. He is obese. He is not ill-appearing, toxic-appearing or diaphoretic.  HENT:     Head: Normocephalic and atraumatic.  Eyes:     General: No scleral icterus.       Right eye: No discharge.        Left eye: No discharge.     Conjunctiva/sclera: Conjunctivae normal.  Cardiovascular:     Rate and Rhythm: Normal rate and regular rhythm.     Heart sounds: Normal heart sounds. No murmur. No friction  rub. No gallop.   Pulmonary:     Effort: Pulmonary effort is normal. No respiratory distress.     Breath sounds: Normal breath sounds. No stridor. No wheezing,  rhonchi or rales.  Musculoskeletal:        General: Normal range of motion.     Cervical back: Normal range of motion.  Skin:    General: Skin is warm and dry.  Neurological:     Mental Status: He is alert and oriented to person, place, and time. Mental status is at baseline.  Psychiatric:        Mood and Affect: Mood normal.        Behavior: Behavior normal.        Thought Content: Thought content normal.        Judgment: Judgment normal.    Diabetic Foot Exam - Simple   Simple Foot Form Diabetic Foot exam was performed with the following findings: Yes 08/23/2019  8:47 AM  Visual Inspection No deformities, no ulcerations, no other skin breakdown bilaterally: Yes Sensation Testing See comments: Yes Pulse Check Posterior Tibialis and Dorsalis pulse intact bilaterally: Yes Comments Intact to touch and monofilament in 2/4 sites on the left and 4/4 on the right.       Lab Results  Component Value Date   TSH 1.020 04/20/2018   Lab Results  Component Value Date   WBC 4.9 07/07/2018   HGB 14.9 07/07/2018   HCT 46.4 07/07/2018   MCV 91.5 07/07/2018   PLT 193 07/07/2018   Lab Results  Component Value Date   NA 138 07/07/2018   K 4.0 07/07/2018   CO2 24 07/07/2018   GLUCOSE 147 (H) 07/07/2018   BUN 11 07/07/2018   CREATININE 0.96 07/07/2018   BILITOT 0.5 04/20/2018   ALKPHOS 75 04/20/2018   AST 36 04/20/2018   ALT 65 (H) 04/20/2018   PROT 7.1 04/20/2018   ALBUMIN 4.7 04/20/2018   CALCIUM 9.1 07/07/2018   ANIONGAP 10 07/07/2018   Lab Results  Component Value Date   CHOL 211 (H) 04/20/2018   Lab Results  Component Value Date   HDL 36 (L) 04/20/2018   Lab Results  Component Value Date   LDLCALC 130 (H) 04/20/2018   Lab Results  Component Value Date   TRIG 227 (H) 04/20/2018   Lab Results  Component Value Date   CHOLHDL 5.9 (H) 04/20/2018   Lab Results  Component Value Date   HGBA1C 7.7 (H) 08/23/2019

## 2019-08-23 ENCOUNTER — Encounter: Payer: Self-pay | Admitting: Family Medicine

## 2019-08-23 ENCOUNTER — Ambulatory Visit (INDEPENDENT_AMBULATORY_CARE_PROVIDER_SITE_OTHER): Payer: 59 | Admitting: Family Medicine

## 2019-08-23 ENCOUNTER — Other Ambulatory Visit: Payer: Self-pay

## 2019-08-23 VITALS — BP 112/75 | HR 58 | Temp 97.8°F | Ht 73.0 in | Wt 302.6 lb

## 2019-08-23 DIAGNOSIS — F331 Major depressive disorder, recurrent, moderate: Secondary | ICD-10-CM

## 2019-08-23 DIAGNOSIS — E1169 Type 2 diabetes mellitus with other specified complication: Secondary | ICD-10-CM | POA: Diagnosis not present

## 2019-08-23 DIAGNOSIS — E119 Type 2 diabetes mellitus without complications: Secondary | ICD-10-CM | POA: Diagnosis not present

## 2019-08-23 DIAGNOSIS — E785 Hyperlipidemia, unspecified: Secondary | ICD-10-CM

## 2019-08-23 DIAGNOSIS — G500 Trigeminal neuralgia: Secondary | ICD-10-CM | POA: Diagnosis not present

## 2019-08-23 DIAGNOSIS — Z130 Encounter for screening for diseases of the blood and blood-forming organs and certain disorders involving the immune mechanism: Secondary | ICD-10-CM

## 2019-08-23 DIAGNOSIS — Z79899 Other long term (current) drug therapy: Secondary | ICD-10-CM

## 2019-08-23 DIAGNOSIS — I152 Hypertension secondary to endocrine disorders: Secondary | ICD-10-CM

## 2019-08-23 DIAGNOSIS — E1159 Type 2 diabetes mellitus with other circulatory complications: Secondary | ICD-10-CM

## 2019-08-23 DIAGNOSIS — G4762 Sleep related leg cramps: Secondary | ICD-10-CM

## 2019-08-23 DIAGNOSIS — K219 Gastro-esophageal reflux disease without esophagitis: Secondary | ICD-10-CM | POA: Insufficient documentation

## 2019-08-23 DIAGNOSIS — I10 Essential (primary) hypertension: Secondary | ICD-10-CM

## 2019-08-23 LAB — CMP14+EGFR
ALT: 60 IU/L — ABNORMAL HIGH (ref 0–44)
AST: 28 IU/L (ref 0–40)
Albumin/Globulin Ratio: 1.7 (ref 1.2–2.2)
Albumin: 4.3 g/dL (ref 3.8–4.9)
Alkaline Phosphatase: 71 IU/L (ref 39–117)
BUN/Creatinine Ratio: 13 (ref 9–20)
BUN: 13 mg/dL (ref 6–24)
Bilirubin Total: 0.4 mg/dL (ref 0.0–1.2)
CO2: 22 mmol/L (ref 20–29)
Calcium: 9.7 mg/dL (ref 8.7–10.2)
Chloride: 102 mmol/L (ref 96–106)
Creatinine, Ser: 1.03 mg/dL (ref 0.76–1.27)
GFR calc Af Amer: 95 mL/min/{1.73_m2} (ref >59)
GFR calc non Af Amer: 82 mL/min/{1.73_m2} (ref >59)
Globulin, Total: 2.5 g/dL (ref 1.5–4.5)
Glucose: 174 mg/dL — ABNORMAL HIGH (ref 65–99)
Potassium: 5 mmol/L (ref 3.5–5.2)
Sodium: 137 mmol/L (ref 134–144)
Total Protein: 6.8 g/dL (ref 6.0–8.5)

## 2019-08-23 LAB — CBC WITH DIFFERENTIAL/PLATELET
Basophils Absolute: 0 10*3/uL (ref 0.0–0.2)
Basos: 1 %
EOS (ABSOLUTE): 0.2 10*3/uL (ref 0.0–0.4)
Eos: 3 %
Hematocrit: 46.6 % (ref 37.5–51.0)
Hemoglobin: 15.3 g/dL (ref 13.0–17.7)
Immature Grans (Abs): 0 10*3/uL (ref 0.0–0.1)
Immature Granulocytes: 0 %
Lymphocytes Absolute: 2.7 10*3/uL (ref 0.7–3.1)
Lymphs: 44 %
MCH: 30.1 pg (ref 26.6–33.0)
MCHC: 32.8 g/dL (ref 31.5–35.7)
MCV: 92 fL (ref 79–97)
Monocytes Absolute: 0.5 10*3/uL (ref 0.1–0.9)
Monocytes: 8 %
Neutrophils Absolute: 2.7 10*3/uL (ref 1.4–7.0)
Neutrophils: 44 %
Platelets: 243 10*3/uL (ref 150–450)
RBC: 5.09 x10E6/uL (ref 4.14–5.80)
RDW: 13 % (ref 11.6–15.4)
WBC: 6.1 10*3/uL (ref 3.4–10.8)

## 2019-08-23 LAB — BAYER DCA HB A1C WAIVED: HB A1C (BAYER DCA - WAIVED): 7.7 % — ABNORMAL HIGH (ref <7.0)

## 2019-08-23 LAB — LIPID PANEL
Chol/HDL Ratio: 6.6 ratio — ABNORMAL HIGH (ref 0.0–5.0)
Cholesterol, Total: 204 mg/dL — ABNORMAL HIGH (ref 100–199)
HDL: 31 mg/dL — ABNORMAL LOW (ref >39)
LDL Chol Calc (NIH): 120 mg/dL — ABNORMAL HIGH (ref 0–99)
Triglycerides: 301 mg/dL — ABNORMAL HIGH (ref 0–149)
VLDL Cholesterol Cal: 53 mg/dL — ABNORMAL HIGH (ref 5–40)

## 2019-08-23 MED ORDER — EZETIMIBE 10 MG PO TABS: 10.0000 mg | ORAL_TABLET | Freq: Every day | ORAL | 1 refills | Status: DC

## 2019-08-23 MED ORDER — ENALAPRIL MALEATE 20 MG PO TABS: 20.0000 mg | ORAL_TABLET | Freq: Two times a day (BID) | ORAL | 1 refills | Status: DC

## 2019-08-23 MED ORDER — JARDIANCE 25 MG PO TABS: 25.0000 mg | ORAL_TABLET | Freq: Every day | ORAL | 0 refills | Status: DC

## 2019-08-23 MED ORDER — TRAMADOL HCL 50 MG PO TABS: 50.0000 mg | ORAL_TABLET | Freq: Every day | ORAL | 2 refills | Status: DC

## 2019-08-23 MED ORDER — METFORMIN HCL 500 MG PO TABS: ORAL_TABLET | ORAL | 0 refills | Status: DC

## 2019-08-23 MED ORDER — GABAPENTIN 300 MG PO CAPS: 300.0000 mg | ORAL_CAPSULE | Freq: Three times a day (TID) | ORAL | 1 refills | Status: DC

## 2019-08-23 MED ORDER — ROPINIROLE HCL 0.25 MG PO TABS: ORAL_TABLET | ORAL | 1 refills | Status: DC

## 2019-08-23 MED ORDER — OXCARBAZEPINE 600 MG PO TABS: 600.0000 mg | ORAL_TABLET | Freq: Three times a day (TID) | ORAL | 1 refills | Status: DC

## 2019-08-23 MED ORDER — PANTOPRAZOLE SODIUM 40 MG PO TBEC: 40.0000 mg | DELAYED_RELEASE_TABLET | Freq: Every day | ORAL | 1 refills | Status: DC

## 2019-08-23 NOTE — Patient Instructions (Signed)

## 2019-08-24 LAB — MICROALBUMIN / CREATININE URINE RATIO
Creatinine, Urine: 38.9 mg/dL
Microalb/Creat Ratio: <8 mg/g creat (ref 0–29)
Microalbumin, Urine: <3 ug/mL

## 2019-08-28 LAB — COMPLIANCE DRUG ANALYSIS, UR

## 2019-10-01 ENCOUNTER — Other Ambulatory Visit: Payer: Self-pay | Admitting: Orthopedic Surgery

## 2019-10-21 ENCOUNTER — Ambulatory Visit (INDEPENDENT_AMBULATORY_CARE_PROVIDER_SITE_OTHER): Payer: 59 | Admitting: Nurse Practitioner

## 2019-10-21 ENCOUNTER — Encounter: Payer: Self-pay | Admitting: Nurse Practitioner

## 2019-10-21 ENCOUNTER — Ambulatory Visit (INDEPENDENT_AMBULATORY_CARE_PROVIDER_SITE_OTHER): Payer: 59

## 2019-10-21 ENCOUNTER — Telehealth: Payer: Self-pay | Admitting: Family

## 2019-10-21 ENCOUNTER — Other Ambulatory Visit: Payer: Self-pay

## 2019-10-21 VITALS — BP 115/82 | HR 81 | Temp 97.5°F | Ht 73.0 in | Wt 298.0 lb

## 2019-10-21 DIAGNOSIS — M25561 Pain in right knee: Secondary | ICD-10-CM | POA: Diagnosis not present

## 2019-10-21 MED ORDER — NAPROXEN SODIUM 550 MG PO TABS: 550.0000 mg | ORAL_TABLET | Freq: Two times a day (BID) | ORAL | 0 refills | Status: DC

## 2019-10-21 NOTE — Telephone Encounter (Signed)
NA/NVM Pt will need an appt, no visit discussing OSA Will also need to obtain copy of sleep study

## 2019-10-21 NOTE — Assessment & Plan Note (Signed)
Pain in right knee is not well managed, provided education to patient, to rest joint, apply ice, compression and elevate joint. Started on Naproxen 550 mg to treat inflammation and pain. X-ray completed. Patient knows to follow up with worsening or unresolved symptoms.   Rx sent to pharmacy.

## 2019-10-21 NOTE — Patient Instructions (Addendum)
Acute pain of right knee Pain in right knee is not well managed, provided education to patient, to rest joint, apply ice, compression and elevate joint. Started on Naproxen 550 mg to treat inflammation and pain. X-ray completed. Patient knows to follow up with worsening or unresolved symptoms.   Rx sent to pharmacy.   Acute Knee Pain, Adult Many things can cause knee pain. Sometimes, knee pain is sudden (acute) and may be caused by damage, swelling, or irritation of the muscles and tissues that support your knee. The pain often goes away on its own with time and rest. If the pain does not go away, tests may be done to find out what is causing the pain. Follow these instructions at home: Pay attention to any changes in your symptoms. Take these actions to relieve your pain. If you have a knee sleeve or brace:   Wear the sleeve or brace as told by your doctor. Remove it only as told by your doctor.  Loosen the sleeve or brace if your toes: ? Tingle. ? Become numb. ? Turn cold and blue.  Keep the sleeve or brace clean.  If the sleeve or brace is not waterproof: ? Do not let it get wet. ? Cover it with a watertight covering when you take a bath or shower. Activity  Rest your knee.  Do not do things that cause pain.  Avoid activities where both feet leave the ground at the same time (high-impact activities). Examples are running, jumping rope, and doing jumping jacks.  Work with a physical therapist to make a safe exercise program, as told by your doctor. Managing pain, stiffness, and swelling   If told, put ice on the knee: ? Put ice in a plastic bag. ? Place a towel between your skin and the bag. ? Leave the ice on for 20 minutes, 2-3 times a day.  If told, put pressure (compression) on your injured knee to control swelling, give support, and help with discomfort. Compression may be done with an elastic bandage. General instructions  Take all medicines only as told by your  doctor.  Raise (elevate) your knee while you are sitting or lying down. Make sure your knee is higher than your heart.  Sleep with a pillow under your knee.  Do not use any products that contain nicotine or tobacco. These include cigarettes, e-cigarettes, and chewing tobacco. These products may slow down healing. If you need help quitting, ask your doctor.  If you are overweight, work with your doctor and a food expert (dietitian) to set goals to lose weight. Being overweight can make your knee hurt more.  Keep all follow-up visits as told by your doctor. This is important. Contact a doctor if:  The knee pain does not stop.  The knee pain changes or gets worse.  You have a fever along with knee pain.  Your knee feels warm when you touch it.  Your knee gives out or locks up. Get help right away if:  Your knee swells, and the swelling gets worse.  You cannot move your knee.  You have very bad knee pain. Summary  Many things can cause knee pain. The pain often goes away on its own with time and rest.  Your doctor may do tests to find out the cause of the pain.  Pay attention to any changes in your symptoms. Relieve your pain with rest, medicines, light activity, and use of ice.  Get help right away if you cannot move  your knee or your knee pain is very bad. This information is not intended to replace advice given to you by your health care provider. Make sure you discuss any questions you have with your health care provider. Document Revised: 10/26/2017 Document Reviewed: 10/26/2017 Elsevier Patient Education  [employer].

## 2019-10-21 NOTE — Progress Notes (Signed)
Acute Office Visit  Subjective:    Patient ID: Matthew Pennington, male    DOB: [birth year], 54 y.o.   MRN: [id number]  Chief Complaint  Patient presents with  . Knee Pain    Right- stepped in hole    HPI Patient is in today for right knee pain, patient reports stepping into a hole while walking about 3 weeks ago and since then he has had excruciating pain in his right knee.  Slight erythema on right knee. Pain is a 8 out of  10 on a pain scale of 0-10.  Patient has applied ice and used Motrin with only moderate relief.  Past Medical History:  Diagnosis Date  . Diabetes mellitus without complication (HCC)   . H/O echocardiogram 09/2017   normal EF  . Headache   . Hyperlipidemia   . Hypertension   . Light headedness   . OSA on CPAP   . Palpitations   . Tingling    toes  . Trigeminal neuralgia     Past Surgical History:  Procedure Laterality Date  . NO PAST SURGERIES      Family History  Problem Relation Age of Onset  . Diabetes Mother   . Hyperlipidemia Mother   . Hypertension Mother   . Lung cancer Father   . Heart disease Sister     Social History   Socioeconomic History  . Marital status: Married    Spouse name: Lambert Mody  . Number of children: 3  . Years of education: Not on file  . Highest education level: Associate degree: academic program  Occupational History  . Not on file  Tobacco Use  . Smoking status: Never Smoker  . Smokeless tobacco: Never Used  Substance and Sexual Activity  . Alcohol use: No  . Drug use: No  . Sexual activity: Not on file  Other Topics Concern  . 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 Difficulty of Paying Living Expenses:   Food Insecurity:   . Worried About Programme researcher, broadcasting/film/video in the Last Year:   . Barista in the Last Year:   Transportation Needs:   . Automotive engineer (Medical):   Marland Kitchen Lack of Transportation (Non-Medical):   Physical Activity:   . Days of Exercise per Week:   . Minutes of Exercise per Session:   Stress:   . Feeling of Stress :   Social Connections:   . Frequency of Communication with Friends and Family:   . Frequency of Social Gatherings with Friends and Family:   . Attends Religious Services:   . Active Member of Clubs or Organizations:   . Attends Banker Meetings:   Marland Kitchen Marital Status:   Intimate Partner Violence:   . Fear of Current or Ex-Partner:   . Emotionally Abused:   Marland Kitchen Physically Abused:   . Sexually Abused:     Outpatient Medications Prior to Visit  Medication Sig Dispense Refill  . empagliflozin (JARDIANCE) 25 MG TABS tablet Take 25 mg by mouth daily. (Needs to be seen before next refill) 90 tablet 0  . enalapril (VASOTEC) 20 MG tablet Take 1 tablet (20 mg total) by mouth 2 (two) times daily. 180 tablet 1  . ezetimibe (ZETIA) 10 MG tablet Take 1 tablet (10 mg total) by mouth daily. 90  tablet 1  . gabapentin (NEURONTIN) 300 MG capsule Take 1 capsule (300 mg total) by mouth 3 (three) times daily. 270 capsule 1  . ibuprofen (ADVIL) 800 MG tablet Take 1 tablet (800 mg total) by mouth daily. 90 tablet 5  . metFORMIN (GLUCOPHAGE) 500 MG tablet Take 2 tablets (1,000 mg total) by mouth daily with breakfast AND 1 tablet (500 mg total) every evening. 270 tablet 0  . oxcarbazepine (TRILEPTAL) 600 MG tablet Take 1 tablet (600 mg total) by mouth 3 (three) times daily. 270 tablet 1  . oxyCODONE-acetaminophen (PERCOCET) 10-325 MG tablet Take 1 tablet by mouth every 6 (six) hours as needed for pain.    . pantoprazole (PROTONIX) 40 MG tablet Take 1 tablet (40 mg total) by mouth daily. 90 tablet 1  . rOPINIRole (REQUIP) 0.25 MG tablet TAKE 1 TO 2 TABLETS BY MOUTH AT BEDTIME FOR  RESTLESS  LEGS 180 tablet 1  . traMADol (ULTRAM) 50 MG tablet Take 1 tablet (50 mg total) by mouth daily. 30 tablet 2   No  facility-administered medications prior to visit.    Allergies  Allergen Reactions  . Morphine And Related Other (See Comments)    "Makes me go Crazy"  . Statins Other (See Comments)    Joint/mucles pains    Review of Systems  Constitutional: Negative.   HENT: Negative.   Eyes: Negative.   Respiratory: Negative.   Cardiovascular: Negative.   Gastrointestinal: Negative.   Genitourinary: Negative.   Musculoskeletal: Positive for gait problem and joint swelling.  Skin: Positive for color change. Negative for rash.  Psychiatric/Behavioral: The patient is not nervous/anxious.        Objective:    Physical Exam Vitals reviewed.  Constitutional:      Appearance: Normal appearance.  HENT:     Head: Normocephalic.  Eyes:     Conjunctiva/sclera: Conjunctivae normal.  Cardiovascular:     Rate and Rhythm: Normal rate and regular rhythm.     Pulses: Normal pulses.     Heart sounds: Normal heart sounds.  Pulmonary:     Effort: Pulmonary effort is normal.     Breath sounds: Normal breath sounds.  Abdominal:     General: Bowel sounds are normal.  Musculoskeletal:        General: Swelling and tenderness present.     Cervical back: Neck supple.     Comments: Right knee  Skin:    Findings: Erythema present.  Neurological:     Mental Status: He is alert and oriented to person, place, and time.  Psychiatric:        Mood and Affect: Mood normal.     BP 115/82   Pulse 81   Temp (!) 97.5 F (36.4 C) (Temporal)   Ht 6\' 1"  (1.854 m)   Wt 298 lb (135.2 kg)   SpO2 96%   BMI 39.32 kg/m  Wt Readings from Last 3 Encounters:  10/21/19 298 lb (135.2 kg)  08/23/19 (!) 302 lb 9.6 oz (137.3 kg)  02/21/19 (!) 302 lb 9.6 oz (137.3 kg)    Health Maintenance Due  Topic Date Due  . OPHTHALMOLOGY EXAM  Never done  . COVID-19 Vaccine (1) Never done  . COLONOSCOPY  Never done      Lab Results  Component Value Date   TSH 1.020 04/20/2018   Lab Results  Component Value Date    WBC 6.1 08/23/2019   HGB 15.3 08/23/2019   HCT 46.6 08/23/2019   MCV 92 08/23/2019  PLT 243 08/23/2019   Lab Results  Component Value Date   NA 137 08/23/2019   K 5.0 08/23/2019   CO2 22 08/23/2019   GLUCOSE 174 (H) 08/23/2019   BUN 13 08/23/2019   CREATININE 1.03 08/23/2019   BILITOT 0.4 08/23/2019   ALKPHOS 71 08/23/2019   AST 28 08/23/2019   ALT 60 (H) 08/23/2019   PROT 6.8 08/23/2019   ALBUMIN 4.3 08/23/2019   CALCIUM 9.7 08/23/2019   ANIONGAP 10 07/07/2018   Lab Results  Component Value Date   CHOL 204 (H) 08/23/2019   Lab Results  Component Value Date   HDL 31 (L) 08/23/2019   Lab Results  Component Value Date   LDLCALC 120 (H) 08/23/2019   Lab Results  Component Value Date   TRIG 301 (H) 08/23/2019   Lab Results  Component Value Date   CHOLHDL 6.6 (H) 08/23/2019   Lab Results  Component Value Date   HGBA1C 7.7 (H) 08/23/2019       Assessment & Plan:   Problem List Items Addressed This Visit      Other   Acute pain of right knee - Primary    Pain in right knee is not well managed, provided education to patient, to rest joint, apply ice, compression and elevate joint. Started on Naproxen 550 mg to treat inflammation and pain. X-ray completed. Patient knows to follow up with worsening or unresolved symptoms.   Rx sent to pharmacy.      Relevant Medications   naproxen sodium (ANAPROX DS) 550 MG tablet   Other Relevant Orders   DG Knee 1-2 Views Right (Completed)       Meds ordered this encounter  Medications  . naproxen sodium (ANAPROX DS) 550 MG tablet    Sig: Take 1 tablet (550 mg total) by mouth 2 (two) times daily with a meal.    Dispense:  30 tablet    Refill:  0    Order Specific Question:   Supervising Provider    Answer:   Arville Care A [[id number]]     Daryll Drown, NP

## 2019-10-21 NOTE — Telephone Encounter (Signed)
Can pt do a virtual visit or does he ntbs in office? Pt will try and find the sleep study report.

## 2019-10-22 NOTE — Telephone Encounter (Signed)
It could be a virtual visit. However, has his sleep study been recent? IF not we may just need to place a referral for a new one.

## 2019-10-22 NOTE — Telephone Encounter (Signed)
In person appointment scheduled on 10/30/2019 to discuss need for Cpap supplies.

## 2019-10-22 NOTE — Telephone Encounter (Signed)
Pt calling and states his last sleep study was December 2012. He states that he has leakage in his mask and is needing new supplies in order for the machine to work properly for him.

## 2019-10-24 NOTE — Telephone Encounter (Signed)
NA VM full would like to see if pt could get a copy of his sleep study to bring to his visit since it is not in his chart

## 2019-10-30 ENCOUNTER — Ambulatory Visit (INDEPENDENT_AMBULATORY_CARE_PROVIDER_SITE_OTHER): Payer: 59 | Admitting: Family

## 2019-10-30 ENCOUNTER — Telehealth: Payer: Self-pay | Admitting: Family

## 2019-10-30 ENCOUNTER — Other Ambulatory Visit: Payer: Self-pay

## 2019-10-30 ENCOUNTER — Encounter: Payer: Self-pay | Admitting: Family

## 2019-10-30 VITALS — BP 122/79 | HR 93 | Temp 97.8°F | Ht 73.0 in | Wt 298.8 lb

## 2019-10-30 DIAGNOSIS — E1169 Type 2 diabetes mellitus with other specified complication: Secondary | ICD-10-CM

## 2019-10-30 DIAGNOSIS — G4733 Obstructive sleep apnea (adult) (pediatric): Secondary | ICD-10-CM

## 2019-10-30 NOTE — Telephone Encounter (Signed)
Tiffany with [facility] calling and states before they can approve a new CPAP to be sent to the pt the office note for today needs to state that the pt is currently compliant with using the machine he currently has. She states without the note saying that they will not be able to give him a new one.

## 2019-10-30 NOTE — Telephone Encounter (Signed)
Please review and advise.

## 2019-10-30 NOTE — Telephone Encounter (Signed)
Please fax my office note.

## 2019-10-30 NOTE — Progress Notes (Addendum)
Subjective:    Patient ID: Matthew Pennington, male    DOB: 10-01-1965, 54 y.o.   MRN: [id number]  Chief Complaint  Patient presents with  . Sleep Apnea    Discuss CPAP supplies    HPI Pt presents to the office today to discuss OSA. He states he was diagnosed with OSA about 9 years ago. He states he uses his CPAP nightly and he complaint with his CPAP. He  states he feels so much better since using. He has switched medical supplies and needs a new prescription for his CPAP.  Pt is compliant and benefitting with cpap therapy, needs new supplies through new provider per change in insurance.   Pt is morbid obese and has  DM.    Review of Systems  All other systems reviewed and are negative.      Objective:   Physical Exam Vitals reviewed.  Constitutional:      General: He is not in acute distress.    Appearance: He is well-developed. He is obese.  HENT:     Head: Normocephalic.     Right Ear: Tympanic membrane normal.     Left Ear: Tympanic membrane normal.  Eyes:     General:        Right eye: No discharge.        Left eye: No discharge.     Pupils: Pupils are equal, round, and reactive to light.  Neck:     Thyroid: No thyromegaly.  Cardiovascular:     Rate and Rhythm: Normal rate and regular rhythm.     Heart sounds: Normal heart sounds. No murmur.  Pulmonary:     Effort: Pulmonary effort is normal. No respiratory distress.     Breath sounds: Normal breath sounds. No wheezing.  Abdominal:     General: Bowel sounds are normal. There is no distension.     Palpations: Abdomen is soft.     Tenderness: There is no abdominal tenderness.  Musculoskeletal:        General: No tenderness. Normal range of motion.     Cervical back: Normal range of motion and neck supple.  Skin:    General: Skin is warm and dry.     Findings: No erythema or rash.  Neurological:     Mental Status: He is alert and oriented to person, place, and time.     Cranial Nerves: No cranial nerve deficit.      Deep Tendon Reflexes: Reflexes are normal and symmetric.  Psychiatric:        Behavior: Behavior normal.        Thought Content: Thought content normal.        Judgment: Judgment normal.       BP 122/79   Pulse 93   Temp 97.8 F (36.6 C) (Temporal)   Ht 6\' 1"  (1.854 m)   Wt 298 lb 12.8 oz (135.5 kg)   SpO2 95%   BMI 39.42 kg/m      Assessment & Plan:  Matthew Pennington comes in today with chief complaint of Sleep Apnea (Discuss CPAP supplies)   Diagnosis and orders addressed:  1. OSA (obstructive sleep apnea) Will fax prescription  Encouraged weight loss - For home use only DME continuous positive airway pressure (CPAP)  2. Type 2 diabetes mellitus with other specified complication, unspecified whether long term insulin use (HCC)   3. Morbid obesity (HCC)  [name], FNP

## 2019-10-30 NOTE — Patient Instructions (Signed)
Sleep Apnea Sleep apnea is a condition in which breathing pauses or becomes shallow during sleep. Episodes of sleep apnea usually last 10 seconds or longer, and they may occur as many as 20 times an hour. Sleep apnea disrupts your sleep and keeps your body from getting the rest that it needs. This condition can increase your risk of certain health problems, including:  Heart attack.  Stroke.  Obesity.  Diabetes.  Heart failure.  Irregular heartbeat. What are the causes? There are three kinds of sleep apnea:  Obstructive sleep apnea. This kind is caused by a blocked or collapsed airway.  Central sleep apnea. This kind happens when the part of the brain that controls breathing does not send the correct signals to the muscles that control breathing.  Mixed sleep apnea. This is a combination of obstructive and central sleep apnea. The most common cause of this condition is a collapsed or blocked airway. An airway can collapse or become blocked if:  Your throat muscles are abnormally relaxed.  Your tongue and tonsils are larger than normal.  You are overweight.  Your airway is smaller than normal. What increases the risk? You are more likely to develop this condition if you:  Are overweight.  Smoke.  Have a smaller than normal airway.  Are elderly.  Are male.  Drink alcohol.  Take sedatives or tranquilizers.  Have a family history of sleep apnea. What are the signs or symptoms? Symptoms of this condition include:  Trouble staying asleep.  Daytime sleepiness and tiredness.  Irritability.  Loud snoring.  Morning headaches.  Trouble concentrating.  Forgetfulness.  Decreased interest in sex.  Unexplained sleepiness.  Mood swings.  Personality changes.  Feelings of depression.  Waking up often during the night to urinate.  Dry mouth.  Sore throat. How is this diagnosed? This condition may be diagnosed with:  A medical history.  A physical  exam.  A series of tests that are done while you are sleeping (sleep study). These tests are usually done in a sleep lab, but they may also be done at home. How is this treated? Treatment for this condition aims to restore normal breathing and to ease symptoms during sleep. It may involve managing health issues that can affect breathing, such as high blood pressure or obesity. Treatment may include:  Sleeping on your side.  Using a decongestant if you have nasal congestion.  Avoiding the use of depressants, including alcohol, sedatives, and narcotics.  Losing weight if you are overweight.  Making changes to your diet.  Quitting smoking.  Using a device to open your airway while you sleep, such as: ? An oral appliance. This is a custom-made mouthpiece that shifts your lower jaw forward. ? A continuous positive airway pressure (CPAP) device. This device blows air through a mask when you breathe out (exhale). ? A nasal expiratory positive airway pressure (EPAP) device. This device has valves that you put into each nostril. ? A bi-level positive airway pressure (BPAP) device. This device blows air through a mask when you breathe in (inhale) and breathe out (exhale).  Having surgery if other treatments do not work. During surgery, excess tissue is removed to create a wider airway. It is important to get treatment for sleep apnea. Without treatment, this condition can lead to:  High blood pressure.  Coronary artery disease.  In men, an inability to achieve or maintain an erection (impotence).  Reduced thinking abilities. Follow these instructions at home: Lifestyle  Make any lifestyle changes   that your health care provider recommends.  Eat a healthy, well-balanced diet.  Take steps to lose weight if you are overweight.  Avoid using depressants, including alcohol, sedatives, and narcotics.  Do not use any products that contain nicotine or tobacco, such as cigarettes,  e-cigarettes, and chewing tobacco. If you need help quitting, ask your health care provider. General instructions  Take over-the-counter and prescription medicines only as told by your health care provider.  If you were given a device to open your airway while you sleep, use it only as told by your health care provider.  If you are having surgery, make sure to tell your health care provider you have sleep apnea. You may need to bring your device with you.  Keep all follow-up visits as told by your health care provider. This is important. Contact a health care provider if:  The device that you received to open your airway during sleep is uncomfortable or does not seem to be working.  Your symptoms do not improve.  Your symptoms get worse. Get help right away if:  You develop: ? Chest pain. ? Shortness of breath. ? Discomfort in your back, arms, or stomach.  You have: ? Trouble speaking. ? Weakness on one side of your body. ? Drooping in your face. These symptoms may represent a serious problem that is an emergency. Do not wait to see if the symptoms will go away. Get medical help right away. Call your local emergency services (911 in the [place] Do not drive yourself to the hospital. Summary  Sleep apnea is a condition in which breathing pauses or becomes shallow during sleep.  The most common cause is a collapsed or blocked airway.  The goal of treatment is to restore normal breathing and to ease symptoms during sleep. This information is not intended to replace advice given to you by your health care provider. Make sure you discuss any questions you have with your health care provider. Document Revised: 10/31/2018 Document Reviewed: 01/09/2018 Elsevier Patient Education  2020 Elsevier Inc.  

## 2019-11-05 ENCOUNTER — Other Ambulatory Visit: Payer: Self-pay | Admitting: Family

## 2019-11-21 ENCOUNTER — Other Ambulatory Visit: Payer: Self-pay | Admitting: *Deleted

## 2019-11-21 DIAGNOSIS — M25561 Pain in right knee: Secondary | ICD-10-CM

## 2019-11-21 MED ORDER — NAPROXEN SODIUM 550 MG PO TABS: 550.0000 mg | ORAL_TABLET | Freq: Two times a day (BID) | ORAL | 0 refills | Status: DC

## 2019-11-23 ENCOUNTER — Other Ambulatory Visit: Payer: Self-pay | Admitting: Family Medicine

## 2019-11-23 DIAGNOSIS — E119 Type 2 diabetes mellitus without complications: Secondary | ICD-10-CM

## 2019-11-25 ENCOUNTER — Other Ambulatory Visit: Payer: Self-pay | Admitting: *Deleted

## 2019-11-25 ENCOUNTER — Telehealth: Payer: Self-pay | Admitting: Family

## 2019-11-25 ENCOUNTER — Ambulatory Visit: Payer: 59 | Admitting: Family

## 2019-11-25 DIAGNOSIS — E119 Type 2 diabetes mellitus without complications: Secondary | ICD-10-CM

## 2019-11-25 MED ORDER — EMPAGLIFLOZIN 25 MG PO TABS: ORAL_TABLET | ORAL | 0 refills | Status: DC

## 2019-11-25 MED ORDER — METFORMIN HCL 500 MG PO TABS: ORAL_TABLET | ORAL | 0 refills | Status: DC

## 2019-11-25 NOTE — Telephone Encounter (Signed)
Prescription sent to pharmacy.

## 2019-11-25 NOTE — Telephone Encounter (Signed)
Patient has a follow up appointment scheduled for July 12 . Needs refill on jardiance and metformin only.  Other medications have refills.  Last labs were in march. Thanks.

## 2019-11-25 NOTE — Telephone Encounter (Signed)
    Prescription Request  11/25/2019  What is the name of the medication or equipment?  gabapentin (NEURONTIN) 300 MG capsule metFORMIN (GLUCOPHAGE) 500 MG tablet empagliflozin (JARDIANCE) 25 MG TABS tablet enalapril (VASOTEC) 20 MG tablet ezetimibe (ZETIA) 10 MG tablet pantoprazole (PROTONIX) 40 MG tablet rOPINIRole (REQUIP) 0.25 MG tablet  Have you contacted your pharmacy to request a refill? (if applicable) yes just for: metFORMIN (GLUCOPHAGE) 500 MG tablet empagliflozin (JARDIANCE) 25 MG TABS tablet  Which pharmacy would you like this sent to? [facility]   Pt had an apt for today and called in to reschedule for July 12th.  He will be out of refills soon  Patient notified that their request is being sent to the clinical staff for review and that they should receive a response within 2 business days.

## 2019-12-09 ENCOUNTER — Ambulatory Visit (INDEPENDENT_AMBULATORY_CARE_PROVIDER_SITE_OTHER): Payer: 59 | Admitting: Family

## 2019-12-09 ENCOUNTER — Other Ambulatory Visit: Payer: Self-pay

## 2019-12-09 ENCOUNTER — Encounter: Payer: Self-pay | Admitting: Family

## 2019-12-09 VITALS — BP 106/70 | HR 86 | Temp 98.3°F | Ht 73.0 in | Wt 301.4 lb

## 2019-12-09 DIAGNOSIS — K219 Gastro-esophageal reflux disease without esophagitis: Secondary | ICD-10-CM

## 2019-12-09 DIAGNOSIS — I152 Hypertension secondary to endocrine disorders: Secondary | ICD-10-CM

## 2019-12-09 DIAGNOSIS — E119 Type 2 diabetes mellitus without complications: Secondary | ICD-10-CM

## 2019-12-09 DIAGNOSIS — Z79899 Other long term (current) drug therapy: Secondary | ICD-10-CM

## 2019-12-09 DIAGNOSIS — M545 Low back pain, unspecified: Secondary | ICD-10-CM

## 2019-12-09 DIAGNOSIS — E1169 Type 2 diabetes mellitus with other specified complication: Secondary | ICD-10-CM | POA: Diagnosis not present

## 2019-12-09 DIAGNOSIS — E1159 Type 2 diabetes mellitus with other circulatory complications: Secondary | ICD-10-CM

## 2019-12-09 DIAGNOSIS — G4762 Sleep related leg cramps: Secondary | ICD-10-CM

## 2019-12-09 DIAGNOSIS — Z1211 Encounter for screening for malignant neoplasm of colon: Secondary | ICD-10-CM

## 2019-12-09 DIAGNOSIS — G4733 Obstructive sleep apnea (adult) (pediatric): Secondary | ICD-10-CM

## 2019-12-09 DIAGNOSIS — I10 Essential (primary) hypertension: Secondary | ICD-10-CM

## 2019-12-09 DIAGNOSIS — G500 Trigeminal neuralgia: Secondary | ICD-10-CM

## 2019-12-09 DIAGNOSIS — F331 Major depressive disorder, recurrent, moderate: Secondary | ICD-10-CM

## 2019-12-09 DIAGNOSIS — E785 Hyperlipidemia, unspecified: Secondary | ICD-10-CM

## 2019-12-09 DIAGNOSIS — F411 Generalized anxiety disorder: Secondary | ICD-10-CM

## 2019-12-09 DIAGNOSIS — G8929 Other chronic pain: Secondary | ICD-10-CM

## 2019-12-09 LAB — BAYER DCA HB A1C WAIVED: HB A1C (BAYER DCA - WAIVED): 7.1 % — ABNORMAL HIGH (ref <7.0)

## 2019-12-09 MED ORDER — GABAPENTIN 300 MG PO CAPS: 300.0000 mg | ORAL_CAPSULE | Freq: Three times a day (TID) | ORAL | 1 refills | Status: DC

## 2019-12-09 MED ORDER — EZETIMIBE 10 MG PO TABS: 10.0000 mg | ORAL_TABLET | Freq: Every day | ORAL | 1 refills | Status: DC

## 2019-12-09 MED ORDER — ROPINIROLE HCL 0.25 MG PO TABS: ORAL_TABLET | ORAL | 1 refills | Status: DC

## 2019-12-09 MED ORDER — EMPAGLIFLOZIN 25 MG PO TABS: ORAL_TABLET | ORAL | 0 refills | Status: DC

## 2019-12-09 MED ORDER — TRAMADOL HCL 50 MG PO TABS: 50.0000 mg | ORAL_TABLET | Freq: Every day | ORAL | 2 refills | Status: DC

## 2019-12-09 MED ORDER — METFORMIN HCL 500 MG PO TABS: ORAL_TABLET | ORAL | 0 refills | Status: DC

## 2019-12-09 MED ORDER — PANTOPRAZOLE SODIUM 40 MG PO TBEC: 40.0000 mg | DELAYED_RELEASE_TABLET | Freq: Every day | ORAL | 1 refills | Status: DC

## 2019-12-09 MED ORDER — ENALAPRIL MALEATE 20 MG PO TABS: 20.0000 mg | ORAL_TABLET | Freq: Two times a day (BID) | ORAL | 1 refills | Status: DC

## 2019-12-09 NOTE — Progress Notes (Signed)
Subjective:    Patient ID: Matthew Pennington, male    DOB: 09-Oct-1965, 54 y.o.   MRN: [id number]  Chief Complaint  Patient presents with  . Medical Management of Chronic Issues  . Diabetes   Pt presents to the office today for chronic follow up. He is followed by Neurologists for Trigeminal neuralgia. She has given him oxycodone as needed for flare ups. He only takes it 3-4 times a year.    He does take Ultram as needed for chronic back pain. He only takes this 1-2 times a month. He never mixes the two.  Diabetes He presents for his follow-up diabetic visit. He has type 2 diabetes mellitus. His disease course has been stable. There are no hypoglycemic associated symptoms. Associated symptoms include foot paresthesias. Pertinent negatives for diabetes include no blurred vision and no visual change. There are no hypoglycemic complications. Symptoms are stable. Diabetic complications include peripheral neuropathy. Pertinent negatives for diabetic complications include no CVA or heart disease. Risk factors for coronary artery disease include diabetes mellitus, hypertension, male sex, obesity, sedentary lifestyle and dyslipidemia. His weight is stable. He is following a generally healthy diet. His overall blood glucose range is 110-130 mg/dl. An ACE inhibitor/angiotensin II receptor blocker is being taken.  Hypertension This is a chronic problem. The current episode started more than 1 year ago. The problem has been resolved since onset. The problem is controlled. Pertinent negatives include no blurred vision, malaise/fatigue, peripheral edema or shortness of breath. Risk factors for coronary artery disease include dyslipidemia, diabetes mellitus, male gender and sedentary lifestyle. The current treatment provides moderate improvement. There is no history of kidney disease, CVA or heart failure.  Gastroesophageal Reflux He complains of belching and heartburn. This is a chronic problem. The current episode  started more than 1 year ago. The problem occurs occasionally. The problem has been waxing and waning. He has tried a PPI for the symptoms. The treatment provided moderate relief.  Hyperlipidemia This is a chronic problem. The current episode started more than 1 year ago. The problem is uncontrolled. Recent lipid tests were reviewed and are high. Exacerbating diseases include obesity. Pertinent negatives include no shortness of breath. Current antihyperlipidemic treatment includes ezetimibe. The current treatment provides mild improvement of lipids. Risk factors for coronary artery disease include dyslipidemia, diabetes mellitus, male sex, hypertension, a sedentary lifestyle and post-menopausal.  Back Pain This is a chronic problem. The current episode started more than 1 year ago. The problem occurs intermittently. The problem has been waxing and waning since onset. The pain is present in the lumbar spine. The quality of the pain is described as aching. The pain is at a severity of 8/10. The pain is moderate.  OSA Uses CPAP nightly. Stable.    Review of Systems  Constitutional: Negative for malaise/fatigue.  Eyes: Negative for blurred vision.  Respiratory: Negative for shortness of breath.   Gastrointestinal: Positive for heartburn.  Musculoskeletal: Positive for back pain.  All other systems reviewed and are negative.      Objective:   Physical Exam Vitals reviewed.  Constitutional:      General: He is not in acute distress.    Appearance: He is well-developed. He is obese.  HENT:     Head: Normocephalic.     Right Ear: Tympanic membrane normal.     Left Ear: Tympanic membrane normal.  Eyes:     General:        Right eye: No discharge.  Left eye: No discharge.     Pupils: Pupils are equal, round, and reactive to light.  Neck:     Thyroid: No thyromegaly.  Cardiovascular:     Rate and Rhythm: Normal rate and regular rhythm.     Heart sounds: Normal heart sounds. No murmur  heard.   Pulmonary:     Effort: Pulmonary effort is normal. No respiratory distress.     Breath sounds: Normal breath sounds. No wheezing.  Abdominal:     General: Bowel sounds are normal. There is no distension.     Palpations: Abdomen is soft.     Tenderness: There is no abdominal tenderness.  Musculoskeletal:        General: No tenderness.     Cervical back: Normal range of motion and neck supple.     Left lower leg: No edema.     Comments: Pain in lumbar with flexion, using cane  Skin:    General: Skin is warm and dry.     Findings: No erythema or rash.  Neurological:     Mental Status: He is alert and oriented to person, place, and time.     Cranial Nerves: No cranial nerve deficit.     Deep Tendon Reflexes: Reflexes are normal and symmetric.  Psychiatric:        Behavior: Behavior normal.        Thought Content: Thought content normal.        Judgment: Judgment normal.       BP 106/70   Pulse 86   Temp 98.3 F (36.8 C) (Temporal)   Ht 6\' 1"  (1.854 m)   Wt (!) 301 lb 6.4 oz (136.7 kg)   BMI 39.76 kg/m      Assessment & Plan:  Matthew Pennington comes in today with chief complaint of Medical Management of Chronic Issues and Diabetes   Diagnosis and orders addressed:  1. Diabetes mellitus without complication (HCC) - Bayer DCA Hb A1c Waived - metFORMIN (GLUCOPHAGE) 500 MG tablet; TAKE 2 TABLETS BY MOUTH ONCE DAILY WITH BREAKFAST AND 1 TABLET IN THE EVENING  Dispense: 270 tablet; Refill: 0 - empagliflozin (JARDIANCE) 25 MG TABS tablet; TAKE 1 TABLET BY MOUTH ONCE DAILY-NEEDS TO BE SEEN BEFORE NEXT REFILL  Dispense: 90 tablet; Refill: 0 - CMP14+EGFR - CBC with Differential/Platelet  2. Gastroesophageal reflux disease, unspecified whether esophagitis present - pantoprazole (PROTONIX) 40 MG tablet; Take 1 tablet (40 mg total) by mouth daily.  Dispense: 90 tablet; Refill: 1 - CMP14+EGFR - CBC with Differential/Platelet  3. Hyperlipidemia associated with type 2  diabetes mellitus (HCC) - ezetimibe (ZETIA) 10 MG tablet; Take 1 tablet (10 mg total) by mouth daily.  Dispense: 90 tablet; Refill: 1 - CMP14+EGFR - CBC with Differential/Platelet  4. Hypertension associated with diabetes (HCC) - enalapril (VASOTEC) 20 MG tablet; Take 1 tablet (20 mg total) by mouth 2 (two) times daily.  Dispense: 180 tablet; Refill: 1 - CMP14+EGFR - CBC with Differential/Platelet  5. Leg cramps, sleep related - rOPINIRole (REQUIP) 0.25 MG tablet; TAKE 1 TO 2 TABLETS BY MOUTH AT BEDTIME FOR  RESTLESS  LEGS  Dispense: 180 tablet; Refill: 1 - CMP14+EGFR - CBC with Differential/Platelet  6. Trigeminal neuralgia - traMADol (ULTRAM) 50 MG tablet; Take 1 tablet (50 mg total) by mouth daily.  Dispense: 20 tablet; Refill: 2 - gabapentin (NEURONTIN) 300 MG capsule; Take 1 capsule (300 mg total) by mouth 3 (three) times daily.  Dispense: 270 capsule; Refill: 1 - CMP14+EGFR - CBC with  Differential/Platelet  7. OSA (obstructive sleep apnea) - CMP14+EGFR - CBC with Differential/Platelet  8. Type 2 diabetes mellitus with other specified complication, unspecified whether long term insulin use (HCC) - CMP14+EGFR - CBC with Differential/Platelet  9. Controlled substance agreement signed - CMP14+EGFR - CBC with Differential/Platelet  10. GAD (generalized anxiety disorder) - CMP14+EGFR - CBC with Differential/Platelet  11. Moderate recurrent major depression (HCC) - CMP14+EGFR - CBC with Differential/Platelet  12. Colon cancer screening - Cologuard - CMP14+EGFR - CBC with Differential/Platelet  13. Chronic bilateral low back pain without sciatica - traMADol (ULTRAM) 50 MG tablet; Take 1 tablet (50 mg total) by mouth daily.  Dispense: 20 tablet; Refill: 2 - CMP14+EGFR - CBC with Differential/Platelet   Labs pending Health Maintenance reviewed Diet and exercise encouraged  Follow up plan: 4 months  [name], FNP

## 2019-12-09 NOTE — Addendum Note (Signed)
Addended by: Ladean Raya on: 12/09/2019 03:32 PM   Modules accepted: Orders

## 2019-12-09 NOTE — Patient Instructions (Signed)
Trigeminal Neuralgia  Trigeminal neuralgia is a nerve disorder that causes severe pain on one side of the face. The pain may last from a few seconds to several minutes. The pain is usually only on one side of the face. Symptoms may occur for days, weeks, or months and then go away for months or years. The pain may return and be worse than before. What are the causes? This condition is caused by damage or pressure to a nerve in the head that is called the trigeminal nerve. An attack can be triggered by:  Talking.  Chewing.  Putting on makeup.  Washing your face.  Shaving your face.  Brushing your teeth.  Touching your face. What increases the risk? You are more likely to develop this condition if you:  Are 50 years of age or older.  Are male. What are the signs or symptoms? The main symptom of this condition is severe pain in the:  Jaw.  Lips.  Eyes.  Nose.  Scalp.  Forehead.  Face. The pain may be:  Intense.  Stabbing.  Electric.  Shock-like. How is this diagnosed? This condition is diagnosed with a physical exam. A CT scan or an MRI may be done to rule out other conditions that can cause facial pain. How is this treated? This condition may be treated with:  Avoiding the things that trigger your symptoms.  Taking prescription medicines (anticonvulsants).  Having surgery. This may be done in severe cases if other medical treatment does not provide relief.  Having procedures such as ablation, thermal, or radiation therapy. It may take up to one month for treatment to start relieving the pain. Follow these instructions at home: Managing pain  Learn as much as you can about how to manage your pain. Ask your health care provider if a pain specialist would be helpful.  Consider talking with a mental health care provider (psychologist) about how to cope with the pain.  Consider joining a pain support group. General instructions  Take  over-the-counter and prescription medicines only as told by your health care provider.  Avoid the things that trigger your symptoms. It may help to: ? Chew on the unaffected side of your mouth. ? Avoid touching your face. ? Avoid blasts of hot or cold air.  Follow your treatment plan as told by your health care provider. This may include: ? Cognitive or behavioral therapy. ? Gentle, regular exercise. ? Meditation or yoga. ? Aromatherapy.  Keep all follow-up visits as told by your health care provider. You may need to be monitored closely to make sure treatment is working well for you. Where to find more information  Facial Pain Association: fpa-support.org Contact a health care provider if:  Your medicine is not helping your symptoms.  You have side effects from the medicine used for treatment.  You develop new, unexplained symptoms, such as: ? Double vision. ? Facial weakness. ? Facial numbness. ? Changes in hearing or balance.  You feel depressed. Get help right away if:  Your pain is severe and is not getting better.  You develop suicidal thoughts. If you ever feel like you may hurt yourself or others, or have thoughts about taking your own life, get help right away. You can go to your nearest emergency department or call:  Your local emergency services (911 in the [place]  A suicide crisis helpline, such as the National Suicide Prevention Lifeline at 1-800-273-8255. This is open 24 hours a day. Summary  Trigeminal neuralgia is a   nerve disorder that causes severe pain on one side of the face. The pain may last from a few seconds to several minutes.  This condition is caused by damage or pressure to a nerve in the head that is called the trigeminal nerve.  Treatment may include avoiding the things that trigger your symptoms, taking medicines, or having surgery or procedures. It may take up to one month for treatment to start relieving the pain.  Avoid the things that  trigger your symptoms.  Keep all follow-up visits as told by your health care provider. You may need to be monitored closely to make sure treatment is working well for you. This information is not intended to replace advice given to you by your health care provider. Make sure you discuss any questions you have with your health care provider. Document Revised: 04/02/2018 Document Reviewed: 04/02/2018 Elsevier Patient Education  2020 Elsevier Inc.  

## 2019-12-10 ENCOUNTER — Other Ambulatory Visit: Payer: Self-pay | Admitting: Family

## 2019-12-10 DIAGNOSIS — R748 Abnormal levels of other serum enzymes: Secondary | ICD-10-CM

## 2019-12-10 LAB — CBC WITH DIFFERENTIAL/PLATELET
Basophils Absolute: 0.1 10*3/uL (ref 0.0–0.2)
Basos: 1 %
EOS (ABSOLUTE): 0.2 10*3/uL (ref 0.0–0.4)
Eos: 3 %
Hematocrit: 46.2 % (ref 37.5–51.0)
Hemoglobin: 15.7 g/dL (ref 13.0–17.7)
Immature Grans (Abs): 0 10*3/uL (ref 0.0–0.1)
Immature Granulocytes: 0 %
Lymphocytes Absolute: 2.6 10*3/uL (ref 0.7–3.1)
Lymphs: 39 %
MCH: 30.6 pg (ref 26.6–33.0)
MCHC: 34 g/dL (ref 31.5–35.7)
MCV: 90 fL (ref 79–97)
Monocytes Absolute: 0.7 10*3/uL (ref 0.1–0.9)
Monocytes: 10 %
Neutrophils Absolute: 3.1 10*3/uL (ref 1.4–7.0)
Neutrophils: 47 %
Platelets: 259 10*3/uL (ref 150–450)
RBC: 5.13 x10E6/uL (ref 4.14–5.80)
RDW: 12.9 % (ref 11.6–15.4)
WBC: 6.6 10*3/uL (ref 3.4–10.8)

## 2019-12-10 LAB — CMP14+EGFR
ALT: 76 IU/L — ABNORMAL HIGH (ref 0–44)
AST: 39 IU/L (ref 0–40)
Albumin/Globulin Ratio: 1.7 (ref 1.2–2.2)
Albumin: 4.5 g/dL (ref 3.8–4.9)
Alkaline Phosphatase: 76 IU/L (ref 48–121)
BUN/Creatinine Ratio: 14 (ref 9–20)
BUN: 15 mg/dL (ref 6–24)
Bilirubin Total: 0.2 mg/dL (ref 0.0–1.2)
CO2: 22 mmol/L (ref 20–29)
Calcium: 9.6 mg/dL (ref 8.7–10.2)
Chloride: 103 mmol/L (ref 96–106)
Creatinine, Ser: 1.06 mg/dL (ref 0.76–1.27)
GFR calc Af Amer: 92 mL/min/{1.73_m2} (ref >59)
GFR calc non Af Amer: 79 mL/min/{1.73_m2} (ref >59)
Globulin, Total: 2.7 g/dL (ref 1.5–4.5)
Glucose: 146 mg/dL — ABNORMAL HIGH (ref 65–99)
Potassium: 5.3 mmol/L — ABNORMAL HIGH (ref 3.5–5.2)
Sodium: 140 mmol/L (ref 134–144)
Total Protein: 7.2 g/dL (ref 6.0–8.5)

## 2019-12-11 LAB — TOXASSURE SELECT 13 (MW), URINE

## 2019-12-12 ENCOUNTER — Other Ambulatory Visit: Payer: Self-pay | Admitting: Family

## 2019-12-13 ENCOUNTER — Telehealth: Payer: Self-pay

## 2019-12-13 NOTE — Telephone Encounter (Signed)
Called patient to inform him that [place] is set up for 12/20/2019 at 830AM arrival time [place] - NPO after midnight. NA LMTCB

## 2019-12-20 ENCOUNTER — Ambulatory Visit (HOSPITAL_COMMUNITY)
Admission: RE | Admit: 2019-12-20 | Discharge: 2019-12-20 | Disposition: A | Discharge status: Home / Self Care | Payer: 59 | Source: Ambulatory Visit | Attending: Family | Admitting: Family

## 2019-12-20 ENCOUNTER — Other Ambulatory Visit: Payer: Self-pay

## 2019-12-20 DIAGNOSIS — R748 Abnormal levels of other serum enzymes: Secondary | ICD-10-CM | POA: Insufficient documentation

## 2019-12-24 ENCOUNTER — Other Ambulatory Visit: Payer: Self-pay | Admitting: Family

## 2019-12-24 DIAGNOSIS — K760 Fatty (change of) liver, not elsewhere classified: Secondary | ICD-10-CM | POA: Insufficient documentation

## 2020-01-21 LAB — COLOGUARD: COLOGUARD: NEGATIVE

## 2020-01-29 LAB — COLOGUARD: Cologuard: NEGATIVE

## 2020-02-04 ENCOUNTER — Telehealth: Payer: Self-pay | Admitting: Family

## 2020-02-04 NOTE — Telephone Encounter (Signed)
Pt states that [facility], will be sending over something for the pt to get his diabetes supplies through them.

## 2020-02-07 DIAGNOSIS — M9901 Segmental and somatic dysfunction of cervical region: Secondary | ICD-10-CM | POA: Diagnosis not present

## 2020-02-07 DIAGNOSIS — M9903 Segmental and somatic dysfunction of lumbar region: Secondary | ICD-10-CM | POA: Diagnosis not present

## 2020-02-07 DIAGNOSIS — M9902 Segmental and somatic dysfunction of thoracic region: Secondary | ICD-10-CM | POA: Diagnosis not present

## 2020-02-11 NOTE — Telephone Encounter (Signed)
PPW received and placed on Christy's desk for signature

## 2020-02-14 DIAGNOSIS — M9903 Segmental and somatic dysfunction of lumbar region: Secondary | ICD-10-CM | POA: Diagnosis not present

## 2020-02-14 DIAGNOSIS — M9901 Segmental and somatic dysfunction of cervical region: Secondary | ICD-10-CM | POA: Diagnosis not present

## 2020-02-14 DIAGNOSIS — M9902 Segmental and somatic dysfunction of thoracic region: Secondary | ICD-10-CM | POA: Diagnosis not present

## 2020-02-17 ENCOUNTER — Other Ambulatory Visit: Payer: Self-pay | Admitting: Family Medicine

## 2020-02-17 DIAGNOSIS — G500 Trigeminal neuralgia: Secondary | ICD-10-CM

## 2020-02-25 DIAGNOSIS — M9902 Segmental and somatic dysfunction of thoracic region: Secondary | ICD-10-CM | POA: Diagnosis not present

## 2020-02-25 DIAGNOSIS — M9901 Segmental and somatic dysfunction of cervical region: Secondary | ICD-10-CM | POA: Diagnosis not present

## 2020-02-25 DIAGNOSIS — M9903 Segmental and somatic dysfunction of lumbar region: Secondary | ICD-10-CM | POA: Diagnosis not present

## 2020-03-11 DIAGNOSIS — M9903 Segmental and somatic dysfunction of lumbar region: Secondary | ICD-10-CM | POA: Diagnosis not present

## 2020-03-11 DIAGNOSIS — M9901 Segmental and somatic dysfunction of cervical region: Secondary | ICD-10-CM | POA: Diagnosis not present

## 2020-03-11 DIAGNOSIS — M9902 Segmental and somatic dysfunction of thoracic region: Secondary | ICD-10-CM | POA: Diagnosis not present

## 2020-03-25 DIAGNOSIS — M9901 Segmental and somatic dysfunction of cervical region: Secondary | ICD-10-CM | POA: Diagnosis not present

## 2020-03-25 DIAGNOSIS — M47812 Spondylosis without myelopathy or radiculopathy, cervical region: Secondary | ICD-10-CM | POA: Diagnosis not present

## 2020-03-25 DIAGNOSIS — M9903 Segmental and somatic dysfunction of lumbar region: Secondary | ICD-10-CM | POA: Diagnosis not present

## 2020-03-25 DIAGNOSIS — M542 Cervicalgia: Secondary | ICD-10-CM | POA: Diagnosis not present

## 2020-03-25 DIAGNOSIS — M9902 Segmental and somatic dysfunction of thoracic region: Secondary | ICD-10-CM | POA: Diagnosis not present

## 2020-04-03 DIAGNOSIS — M9901 Segmental and somatic dysfunction of cervical region: Secondary | ICD-10-CM | POA: Diagnosis not present

## 2020-04-03 DIAGNOSIS — M542 Cervicalgia: Secondary | ICD-10-CM | POA: Diagnosis not present

## 2020-04-03 DIAGNOSIS — M9903 Segmental and somatic dysfunction of lumbar region: Secondary | ICD-10-CM | POA: Diagnosis not present

## 2020-04-03 DIAGNOSIS — M9902 Segmental and somatic dysfunction of thoracic region: Secondary | ICD-10-CM | POA: Diagnosis not present

## 2020-04-03 DIAGNOSIS — M47812 Spondylosis without myelopathy or radiculopathy, cervical region: Secondary | ICD-10-CM | POA: Diagnosis not present

## 2020-04-08 DIAGNOSIS — M47812 Spondylosis without myelopathy or radiculopathy, cervical region: Secondary | ICD-10-CM | POA: Diagnosis not present

## 2020-04-08 DIAGNOSIS — M9903 Segmental and somatic dysfunction of lumbar region: Secondary | ICD-10-CM | POA: Diagnosis not present

## 2020-04-08 DIAGNOSIS — M9901 Segmental and somatic dysfunction of cervical region: Secondary | ICD-10-CM | POA: Diagnosis not present

## 2020-04-08 DIAGNOSIS — M542 Cervicalgia: Secondary | ICD-10-CM | POA: Diagnosis not present

## 2020-04-08 DIAGNOSIS — M9902 Segmental and somatic dysfunction of thoracic region: Secondary | ICD-10-CM | POA: Diagnosis not present

## 2020-04-09 ENCOUNTER — Ambulatory Visit (INDEPENDENT_AMBULATORY_CARE_PROVIDER_SITE_OTHER): Payer: Medicare Other

## 2020-04-09 ENCOUNTER — Encounter: Payer: Self-pay | Admitting: Family

## 2020-04-09 ENCOUNTER — Ambulatory Visit (INDEPENDENT_AMBULATORY_CARE_PROVIDER_SITE_OTHER): Payer: Medicare Other | Admitting: Family

## 2020-04-09 ENCOUNTER — Other Ambulatory Visit: Payer: Self-pay

## 2020-04-09 VITALS — BP 115/68 | HR 67 | Temp 97.8°F | Ht 73.0 in | Wt 297.6 lb

## 2020-04-09 DIAGNOSIS — M1811 Unilateral primary osteoarthritis of first carpometacarpal joint, right hand: Secondary | ICD-10-CM | POA: Diagnosis not present

## 2020-04-09 DIAGNOSIS — K219 Gastro-esophageal reflux disease without esophagitis: Secondary | ICD-10-CM

## 2020-04-09 DIAGNOSIS — G500 Trigeminal neuralgia: Secondary | ICD-10-CM | POA: Diagnosis not present

## 2020-04-09 DIAGNOSIS — Z114 Encounter for screening for human immunodeficiency virus [HIV]: Secondary | ICD-10-CM

## 2020-04-09 DIAGNOSIS — E119 Type 2 diabetes mellitus without complications: Secondary | ICD-10-CM

## 2020-04-09 DIAGNOSIS — M79644 Pain in right finger(s): Secondary | ICD-10-CM

## 2020-04-09 DIAGNOSIS — E1169 Type 2 diabetes mellitus with other specified complication: Secondary | ICD-10-CM | POA: Diagnosis not present

## 2020-04-09 DIAGNOSIS — M545 Low back pain, unspecified: Secondary | ICD-10-CM | POA: Diagnosis not present

## 2020-04-09 DIAGNOSIS — Z1159 Encounter for screening for other viral diseases: Secondary | ICD-10-CM

## 2020-04-09 DIAGNOSIS — G8929 Other chronic pain: Secondary | ICD-10-CM | POA: Diagnosis not present

## 2020-04-09 DIAGNOSIS — I152 Hypertension secondary to endocrine disorders: Secondary | ICD-10-CM

## 2020-04-09 DIAGNOSIS — Z79899 Other long term (current) drug therapy: Secondary | ICD-10-CM

## 2020-04-09 DIAGNOSIS — E785 Hyperlipidemia, unspecified: Secondary | ICD-10-CM

## 2020-04-09 DIAGNOSIS — F331 Major depressive disorder, recurrent, moderate: Secondary | ICD-10-CM

## 2020-04-09 DIAGNOSIS — F411 Generalized anxiety disorder: Secondary | ICD-10-CM

## 2020-04-09 DIAGNOSIS — R531 Weakness: Secondary | ICD-10-CM

## 2020-04-09 DIAGNOSIS — G4733 Obstructive sleep apnea (adult) (pediatric): Secondary | ICD-10-CM

## 2020-04-09 DIAGNOSIS — E1159 Type 2 diabetes mellitus with other circulatory complications: Secondary | ICD-10-CM

## 2020-04-09 LAB — BAYER DCA HB A1C WAIVED: HB A1C (BAYER DCA - WAIVED): 6.9 % (ref <7.0)

## 2020-04-09 MED ORDER — ENALAPRIL MALEATE 20 MG PO TABS: 20.0000 mg | ORAL_TABLET | Freq: Two times a day (BID) | ORAL | 1 refills | Status: DC

## 2020-04-09 MED ORDER — METFORMIN HCL 500 MG PO TABS: ORAL_TABLET | ORAL | 0 refills | Status: DC

## 2020-04-09 MED ORDER — EMPAGLIFLOZIN 25 MG PO TABS: ORAL_TABLET | ORAL | 0 refills | Status: DC

## 2020-04-09 MED ORDER — PANTOPRAZOLE SODIUM 40 MG PO TBEC: 40.0000 mg | DELAYED_RELEASE_TABLET | Freq: Every day | ORAL | 1 refills | Status: DC

## 2020-04-09 MED ORDER — EZETIMIBE 10 MG PO TABS: 10.0000 mg | ORAL_TABLET | Freq: Every day | ORAL | 1 refills | Status: DC

## 2020-04-09 MED ORDER — GABAPENTIN 300 MG PO CAPS: 300.0000 mg | ORAL_CAPSULE | Freq: Three times a day (TID) | ORAL | 1 refills | Status: DC

## 2020-04-09 MED ORDER — TRAMADOL HCL 50 MG PO TABS: 50.0000 mg | ORAL_TABLET | Freq: Every day | ORAL | 2 refills | Status: DC

## 2020-04-09 MED ORDER — OXCARBAZEPINE 600 MG PO TABS: 600.0000 mg | ORAL_TABLET | Freq: Three times a day (TID) | ORAL | 2 refills | Status: DC

## 2020-04-09 NOTE — Patient Instructions (Signed)

## 2020-04-09 NOTE — Progress Notes (Signed)
Subjective:    Patient ID: Matthew Pennington, male    DOB: 1965/06/17, 54 y.o.   MRN: [id number]  Chief Complaint  Patient presents with  . Diabetes    4 month, not fasting  . Hand Problem    right hand cant grip has knot on it    Pt presents to the office today for chronic follow up. Matthew Pennington is followed by Neurologists for Trigeminal neuralgia. She has given him oxycodone as needed for flare ups. Matthew Pennington only takes it 3-4 times a year.    Matthew Pennington does take Ultram as needed for chronic back pain. Matthew Pennington only takes this 1-2 times a month. Matthew Pennington never mixes the two.   Matthew Pennington is currently going to chiropractor once a week for back and shoulder pain.   Matthew Pennington is complaining of right hand weakness that Matthew Pennington noticed a couple months ago. Matthew Pennington also noticed a "knot" on his thumb. Matthew Pennington can not hold a glass.  Diabetes Matthew Pennington presents for his follow-up diabetic visit. Matthew Pennington has type 2 diabetes mellitus. His disease course has been stable. There are no hypoglycemic associated symptoms. Associated symptoms include foot paresthesias. Pertinent negatives for diabetes include no blurred vision. Symptoms are stable. Diabetic complications include peripheral neuropathy. Pertinent negatives for diabetic complications include no CVA, heart disease or nephropathy. Risk factors for coronary artery disease include dyslipidemia, diabetes mellitus, male sex, hypertension and sedentary lifestyle. Matthew Pennington is following a generally healthy diet. His overall blood glucose range is 110-130 mg/dl. Eye exam is current.  Hypertension This is a chronic problem. The current episode started more than 1 year ago. The problem has been resolved since onset. The problem is controlled. Pertinent negatives include no blurred vision, malaise/fatigue, peripheral edema or shortness of breath. Risk factors for coronary artery disease include obesity, male gender, dyslipidemia and diabetes mellitus. The current treatment provides moderate improvement. There is no history of CVA.   Hyperlipidemia This is a chronic problem. The current episode started more than 1 year ago. The problem is controlled. Recent lipid tests were reviewed and are normal. Exacerbating diseases include obesity. Pertinent negatives include no shortness of breath. Current antihyperlipidemic treatment includes ezetimibe. The current treatment provides moderate improvement of lipids.  Gastroesophageal Reflux Matthew Pennington complains of belching and heartburn. This is a chronic problem. The current episode started more than 1 year ago. The problem occurs occasionally. Risk factors include obesity. Matthew Pennington has tried a PPI for the symptoms. The treatment provided moderate relief.      Review of Systems  Constitutional: Negative for malaise/fatigue.  Eyes: Negative for blurred vision.  Respiratory: Negative for shortness of breath.   Gastrointestinal: Positive for heartburn.  All other systems reviewed and are negative.      Objective:   Physical Exam Vitals reviewed.  Constitutional:      General: Matthew Pennington is not in acute distress.    Appearance: Matthew Pennington is well-developed. Matthew Pennington is obese.  HENT:     Head: Normocephalic.     Right Ear: Tympanic membrane normal.     Left Ear: Tympanic membrane normal.  Eyes:     General:        Right eye: No discharge.        Left eye: No discharge.     Pupils: Pupils are equal, round, and reactive to light.  Neck:     Thyroid: No thyromegaly.  Cardiovascular:     Rate and Rhythm: Normal rate and regular rhythm.     Heart sounds: Normal heart sounds. No murmur  heard.   Pulmonary:     Effort: Pulmonary effort is normal. No respiratory distress.     Breath sounds: Normal breath sounds. No wheezing.  Abdominal:     General: Bowel sounds are normal. There is no distension.     Palpations: Abdomen is soft.     Tenderness: There is no abdominal tenderness.  Musculoskeletal:        General: Swelling and tenderness present.       Arms:     Cervical back: Normal range of motion and  neck supple.     Comments: Moderate swelling and tenderness of right thumb  Skin:    General: Skin is warm and dry.     Findings: No erythema or rash.  Neurological:     Mental Status: Matthew Pennington is alert and oriented to person, place, and time.     Cranial Nerves: No cranial nerve deficit.     Deep Tendon Reflexes: Reflexes are normal and symmetric.  Psychiatric:        Behavior: Behavior normal.        Thought Content: Thought content normal.        Judgment: Judgment normal.       BP 115/68   Pulse 67   Temp 97.8 F (36.6 C) (Temporal)   Ht 6\' 1"  (1.854 m)   Wt 297 lb 9.6 oz (135 kg)   SpO2 96%   BMI 39.26 kg/m      Assessment & Plan:  Matthew Pennington comes in today with chief complaint of Diabetes (4 month, not fasting) and Hand Problem (right hand cant grip has knot on it )   Diagnosis and orders addressed:  1. Type 2 diabetes mellitus with other specified complication, unspecified whether long term insulin use (HCC) - Bayer DCA Hb A1c Waived - CMP14+EGFR - Arthritis Panel  2. Trigeminal neuralgia - traMADol (ULTRAM) 50 MG tablet; Take 1 tablet (50 mg total) by mouth daily.  Dispense: 20 tablet; Refill: 2 - oxcarbazepine (TRILEPTAL) 600 MG tablet; Take 1 tablet (600 mg total) by mouth 3 (three) times daily.  Dispense: 270 tablet; Refill: 2 - gabapentin (NEURONTIN) 300 MG capsule; Take 1 capsule (300 mg total) by mouth 3 (three) times daily.  Dispense: 270 capsule; Refill: 1 - CMP14+EGFR  3. Chronic bilateral low back pain without sciatica - traMADol (ULTRAM) 50 MG tablet; Take 1 tablet (50 mg total) by mouth daily.  Dispense: 20 tablet; Refill: 2 - CMP14+EGFR - Arthritis Panel  4. Diabetes mellitus without complication (HCC - metFORMIN (GLUCOPHAGE) 500 MG tablet; TAKE 2 TABLETS BY MOUTH ONCE DAILY WITH BREAKFAST AND 1 TABLET IN THE EVENING  Dispense: 270 tablet; Refill: 0 - empagliflozin (JARDIANCE) 25 MG TABS tablet; TAKE 1 TABLET BY MOUTH ONCE DAILY-NEEDS TO BE SEEN  BEFORE NEXT REFILL  Dispense: 90 tablet; Refill: 0 - CMP14+EGFR  5. Hyperlipidemia associated with type 2 diabetes mellitus (HCC) - ezetimibe (ZETIA) 10 MG tablet; Take 1 tablet (10 mg total) by mouth daily.  Dispense: 90 tablet; Refill: 1 - CMP14+EGFR  6. Gastroesophageal reflux disease, unspecified whether esophagitis present - pantoprazole (PROTONIX) 40 MG tablet; Take 1 tablet (40 mg total) by mouth daily.  Dispense: 90 tablet; Refill: 1 - CMP14+EGFR  7. Hypertension associated with diabetes (HCC)  - enalapril (VASOTEC) 20 MG tablet; Take 1 tablet (20 mg total) by mouth 2 (two) times daily.  Dispense: 180 tablet; Refill: 1 - CMP14+EGFR  8. OSA (obstructive sleep apnea)  - CMP14+EGFR  9. GAD (generalized  anxiety disorder)  - CMP14+EGFR  10. Weakness - CMP14+EGFR - Arthritis Panel  11. Moderate recurrent major depression (HCC) - CMP14+EGFR  12. Pain of right thumb - CMP14+EGFR - DG Finger Thumb Right; Future - Arthritis Panel  13. Controlled substance agreement signed  14. Need for hepatitis C screening test - Hepatitis C antibody  15. Encounter for screening for HIV - HIV Antibody (routine testing w rflx)   Labs pending Health Maintenance reviewed Diet and exercise encouraged  Follow up plan: 6 months    [name], FNP

## 2020-04-10 LAB — ARTHRITIS PANEL
Basophils Absolute: 0.1 10*3/uL (ref 0.0–0.2)
Basos: 1 %
EOS (ABSOLUTE): 0.2 10*3/uL (ref 0.0–0.4)
Eos: 3 %
Hematocrit: 48 % (ref 37.5–51.0)
Hemoglobin: 16.2 g/dL (ref 13.0–17.7)
Immature Grans (Abs): 0 10*3/uL (ref 0.0–0.1)
Immature Granulocytes: 0 %
Lymphocytes Absolute: 3 10*3/uL (ref 0.7–3.1)
Lymphs: 42 %
MCH: 30.9 pg (ref 26.6–33.0)
MCHC: 33.8 g/dL (ref 31.5–35.7)
MCV: 91 fL (ref 79–97)
Monocytes Absolute: 0.7 10*3/uL (ref 0.1–0.9)
Monocytes: 9 %
Neutrophils Absolute: 3.3 10*3/uL (ref 1.4–7.0)
Neutrophils: 45 %
Platelets: 283 10*3/uL (ref 150–450)
RBC: 5.25 x10E6/uL (ref 4.14–5.80)
RDW: 12.7 % (ref 11.6–15.4)
Rheumatoid fact SerPl-aCnc: <10 IU/mL (ref 0.0–13.9)
Sed Rate: 12 mm/hr (ref 0–30)
Uric Acid: 6.6 mg/dL (ref 3.8–8.4)
WBC: 7.2 10*3/uL (ref 3.4–10.8)

## 2020-04-10 LAB — CMP14+EGFR
ALT: 60 IU/L — ABNORMAL HIGH (ref 0–44)
AST: 29 IU/L (ref 0–40)
Albumin/Globulin Ratio: 1.6 (ref 1.2–2.2)
Albumin: 4.6 g/dL (ref 3.8–4.9)
Alkaline Phosphatase: 75 IU/L (ref 44–121)
BUN/Creatinine Ratio: 14 (ref 9–20)
BUN: 13 mg/dL (ref 6–24)
Bilirubin Total: 0.2 mg/dL (ref 0.0–1.2)
CO2: 24 mmol/L (ref 20–29)
Calcium: 9.3 mg/dL (ref 8.7–10.2)
Chloride: 103 mmol/L (ref 96–106)
Creatinine, Ser: 0.96 mg/dL (ref 0.76–1.27)
GFR calc Af Amer: 103 mL/min/{1.73_m2} (ref >59)
GFR calc non Af Amer: 89 mL/min/{1.73_m2} (ref >59)
Globulin, Total: 2.8 g/dL (ref 1.5–4.5)
Glucose: 113 mg/dL — ABNORMAL HIGH (ref 65–99)
Potassium: 4.8 mmol/L (ref 3.5–5.2)
Sodium: 141 mmol/L (ref 134–144)
Total Protein: 7.4 g/dL (ref 6.0–8.5)

## 2020-04-10 LAB — HIV ANTIBODY (ROUTINE TESTING W REFLEX): HIV Screen 4th Generation wRfx: NONREACTIVE

## 2020-04-11 LAB — SPECIMEN STATUS REPORT

## 2020-04-11 LAB — HEPATITIS C ANTIBODY: Hep C Virus Ab: <0.1 s/co ratio (ref 0.0–0.9)

## 2020-04-22 DIAGNOSIS — M9902 Segmental and somatic dysfunction of thoracic region: Secondary | ICD-10-CM | POA: Diagnosis not present

## 2020-04-22 DIAGNOSIS — M47812 Spondylosis without myelopathy or radiculopathy, cervical region: Secondary | ICD-10-CM | POA: Diagnosis not present

## 2020-04-22 DIAGNOSIS — M9903 Segmental and somatic dysfunction of lumbar region: Secondary | ICD-10-CM | POA: Diagnosis not present

## 2020-04-22 DIAGNOSIS — M542 Cervicalgia: Secondary | ICD-10-CM | POA: Diagnosis not present

## 2020-04-22 DIAGNOSIS — M9901 Segmental and somatic dysfunction of cervical region: Secondary | ICD-10-CM | POA: Diagnosis not present

## 2020-05-31 DIAGNOSIS — X58XXXA Exposure to other specified factors, initial encounter: Secondary | ICD-10-CM | POA: Diagnosis not present

## 2020-05-31 DIAGNOSIS — I10 Essential (primary) hypertension: Secondary | ICD-10-CM | POA: Diagnosis not present

## 2020-05-31 DIAGNOSIS — E119 Type 2 diabetes mellitus without complications: Secondary | ICD-10-CM | POA: Diagnosis not present

## 2020-05-31 DIAGNOSIS — S46012A Strain of muscle(s) and tendon(s) of the rotator cuff of left shoulder, initial encounter: Secondary | ICD-10-CM | POA: Diagnosis not present

## 2020-05-31 DIAGNOSIS — M19012 Primary osteoarthritis, left shoulder: Secondary | ICD-10-CM | POA: Diagnosis not present

## 2020-06-02 DIAGNOSIS — S46219A Strain of muscle, fascia and tendon of other parts of biceps, unspecified arm, initial encounter: Secondary | ICD-10-CM | POA: Diagnosis not present

## 2020-06-02 DIAGNOSIS — M12812 Other specific arthropathies, not elsewhere classified, left shoulder: Secondary | ICD-10-CM | POA: Diagnosis not present

## 2020-06-02 DIAGNOSIS — S4992XA Unspecified injury of left shoulder and upper arm, initial encounter: Secondary | ICD-10-CM | POA: Diagnosis not present

## 2020-06-17 DIAGNOSIS — G4733 Obstructive sleep apnea (adult) (pediatric): Secondary | ICD-10-CM | POA: Diagnosis not present

## 2020-06-27 ENCOUNTER — Other Ambulatory Visit: Payer: Self-pay

## 2020-06-27 DIAGNOSIS — E119 Type 2 diabetes mellitus without complications: Secondary | ICD-10-CM

## 2020-06-29 LAB — HM DIABETES EYE EXAM

## 2020-06-29 MED ORDER — METFORMIN HCL 500 MG PO TABS: ORAL_TABLET | ORAL | 0 refills | Status: DC

## 2020-06-29 MED ORDER — EMPAGLIFLOZIN 25 MG PO TABS: ORAL_TABLET | ORAL | 0 refills | Status: DC

## 2020-07-23 DIAGNOSIS — M542 Cervicalgia: Secondary | ICD-10-CM | POA: Diagnosis not present

## 2020-07-23 DIAGNOSIS — M9902 Segmental and somatic dysfunction of thoracic region: Secondary | ICD-10-CM | POA: Diagnosis not present

## 2020-07-23 DIAGNOSIS — S338XXA Sprain of other parts of lumbar spine and pelvis, initial encounter: Secondary | ICD-10-CM | POA: Diagnosis not present

## 2020-07-23 DIAGNOSIS — M47816 Spondylosis without myelopathy or radiculopathy, lumbar region: Secondary | ICD-10-CM | POA: Diagnosis not present

## 2020-07-23 DIAGNOSIS — M9901 Segmental and somatic dysfunction of cervical region: Secondary | ICD-10-CM | POA: Diagnosis not present

## 2020-07-23 DIAGNOSIS — S29012A Strain of muscle and tendon of back wall of thorax, initial encounter: Secondary | ICD-10-CM | POA: Diagnosis not present

## 2020-07-23 DIAGNOSIS — M9903 Segmental and somatic dysfunction of lumbar region: Secondary | ICD-10-CM | POA: Diagnosis not present

## 2020-07-23 DIAGNOSIS — M47812 Spondylosis without myelopathy or radiculopathy, cervical region: Secondary | ICD-10-CM | POA: Diagnosis not present

## 2020-07-28 DEATH — deceased

## 2020-08-03 DIAGNOSIS — M791 Myalgia, unspecified site: Secondary | ICD-10-CM | POA: Diagnosis not present

## 2020-08-03 DIAGNOSIS — R350 Frequency of micturition: Secondary | ICD-10-CM | POA: Diagnosis not present

## 2020-08-03 DIAGNOSIS — Z20822 Contact with and (suspected) exposure to covid-19: Secondary | ICD-10-CM | POA: Diagnosis not present

## 2020-08-04 ENCOUNTER — Ambulatory Visit (INDEPENDENT_AMBULATORY_CARE_PROVIDER_SITE_OTHER): Payer: Medicare Other | Admitting: Family Medicine

## 2020-08-04 DIAGNOSIS — R531 Weakness: Secondary | ICD-10-CM | POA: Diagnosis not present

## 2020-08-04 DIAGNOSIS — K591 Functional diarrhea: Secondary | ICD-10-CM | POA: Diagnosis not present

## 2020-08-04 DIAGNOSIS — E119 Type 2 diabetes mellitus without complications: Secondary | ICD-10-CM | POA: Diagnosis not present

## 2020-08-04 DIAGNOSIS — R197 Diarrhea, unspecified: Secondary | ICD-10-CM | POA: Diagnosis not present

## 2020-08-04 DIAGNOSIS — I10 Essential (primary) hypertension: Secondary | ICD-10-CM | POA: Diagnosis not present

## 2020-08-04 NOTE — Progress Notes (Signed)
Telephone visit Apparently, patient sought care in ED.  No appt conducted virtually. Start time: 1:03pm (mailbox full)  Matthew Norlander, DO [place] [phone]

## 2020-08-05 ENCOUNTER — Emergency Department (HOSPITAL_COMMUNITY)
Admission: EM | Admit: 2020-08-05 | Discharge: 2020-08-05 | Discharge status: Against Medical Advice | Payer: Medicare Other

## 2020-08-05 ENCOUNTER — Other Ambulatory Visit: Payer: Self-pay

## 2020-08-11 ENCOUNTER — Telehealth: Payer: Self-pay

## 2020-08-11 NOTE — Telephone Encounter (Signed)
Patient aware that we do not have any results for a stool test in care everywhere. Patient advised to call [facility]

## 2020-08-12 NOTE — Telephone Encounter (Signed)
Have you seen this test ?

## 2020-08-13 NOTE — Telephone Encounter (Signed)
Pts wife called to see if his stool culture had been reviewed. Reviewed Christys note with pt and wife. They both voiced understanding.

## 2020-08-13 NOTE — Telephone Encounter (Signed)
Stool cultures negative

## 2020-08-18 ENCOUNTER — Other Ambulatory Visit: Payer: Self-pay | Admitting: Family

## 2020-08-18 DIAGNOSIS — E119 Type 2 diabetes mellitus without complications: Secondary | ICD-10-CM

## 2020-08-27 ENCOUNTER — Ambulatory Visit (INDEPENDENT_AMBULATORY_CARE_PROVIDER_SITE_OTHER): Payer: Medicare Other | Admitting: Family

## 2020-08-27 ENCOUNTER — Encounter: Payer: Self-pay | Admitting: Family

## 2020-08-27 ENCOUNTER — Other Ambulatory Visit: Payer: Self-pay

## 2020-08-27 DIAGNOSIS — E1169 Type 2 diabetes mellitus with other specified complication: Secondary | ICD-10-CM

## 2020-08-27 DIAGNOSIS — E119 Type 2 diabetes mellitus without complications: Secondary | ICD-10-CM

## 2020-08-27 DIAGNOSIS — G4733 Obstructive sleep apnea (adult) (pediatric): Secondary | ICD-10-CM

## 2020-08-27 DIAGNOSIS — E1159 Type 2 diabetes mellitus with other circulatory complications: Secondary | ICD-10-CM | POA: Diagnosis not present

## 2020-08-27 DIAGNOSIS — G8929 Other chronic pain: Secondary | ICD-10-CM

## 2020-08-27 DIAGNOSIS — Z79899 Other long term (current) drug therapy: Secondary | ICD-10-CM | POA: Diagnosis not present

## 2020-08-27 DIAGNOSIS — G500 Trigeminal neuralgia: Secondary | ICD-10-CM | POA: Diagnosis not present

## 2020-08-27 DIAGNOSIS — E785 Hyperlipidemia, unspecified: Secondary | ICD-10-CM

## 2020-08-27 DIAGNOSIS — I152 Hypertension secondary to endocrine disorders: Secondary | ICD-10-CM

## 2020-08-27 DIAGNOSIS — F411 Generalized anxiety disorder: Secondary | ICD-10-CM

## 2020-08-27 DIAGNOSIS — G4762 Sleep related leg cramps: Secondary | ICD-10-CM | POA: Diagnosis not present

## 2020-08-27 DIAGNOSIS — K219 Gastro-esophageal reflux disease without esophagitis: Secondary | ICD-10-CM | POA: Diagnosis not present

## 2020-08-27 DIAGNOSIS — F331 Major depressive disorder, recurrent, moderate: Secondary | ICD-10-CM

## 2020-08-27 DIAGNOSIS — M545 Low back pain, unspecified: Secondary | ICD-10-CM

## 2020-08-27 LAB — BAYER DCA HB A1C WAIVED: HB A1C (BAYER DCA - WAIVED): 7.3 % — ABNORMAL HIGH (ref <7.0)

## 2020-08-27 MED ORDER — GABAPENTIN 300 MG PO CAPS: 300.0000 mg | ORAL_CAPSULE | Freq: Three times a day (TID) | ORAL | 1 refills | Status: DC

## 2020-08-27 MED ORDER — EZETIMIBE 10 MG PO TABS
10.0000 mg | ORAL_TABLET | Freq: Every day | ORAL | 1 refills | Status: DC
Start: 2020-08-27 — End: 2021-01-08

## 2020-08-27 MED ORDER — METFORMIN HCL 500 MG PO TABS
ORAL_TABLET | ORAL | 0 refills | Status: DC
Start: 2020-08-27 — End: 2021-01-08

## 2020-08-27 MED ORDER — ROPINIROLE HCL 0.25 MG PO TABS: ORAL_TABLET | ORAL | 1 refills | Status: DC

## 2020-08-27 MED ORDER — ENALAPRIL MALEATE 20 MG PO TABS: 20.0000 mg | ORAL_TABLET | Freq: Two times a day (BID) | ORAL | 1 refills | Status: DC

## 2020-08-27 MED ORDER — EMPAGLIFLOZIN 25 MG PO TABS
25.0000 mg | ORAL_TABLET | Freq: Every day | ORAL | 1 refills | Status: DC
Start: 2020-08-27 — End: 2021-01-08

## 2020-08-27 MED ORDER — IBUPROFEN 800 MG PO TABS: 800.0000 mg | ORAL_TABLET | Freq: Every day | ORAL | 5 refills | Status: DC

## 2020-08-27 MED ORDER — TRAMADOL HCL 50 MG PO TABS: 50.0000 mg | ORAL_TABLET | Freq: Every day | ORAL | 2 refills | Status: DC

## 2020-08-27 NOTE — Patient Instructions (Signed)
Diabetes Mellitus and Nutrition, Adult When you have diabetes, or diabetes mellitus, it is very important to have healthy eating habits because your blood sugar (glucose) levels are greatly affected by what you eat and drink. Eating healthy foods in the right amounts, at about the same times every day, can help you:  Control your blood glucose.  Lower your risk of heart disease.  Improve your blood pressure.  Reach or maintain a healthy weight. What can affect my meal plan? Every person with diabetes is different, and each person has different needs for a meal plan. Your health care provider may recommend that you work with a dietitian to make a meal plan that is best for you. Your meal plan may vary depending on factors such as:  The calories you need.  The medicines you take.  Your weight.  Your blood glucose, blood pressure, and cholesterol levels.  Your activity level.  Other health conditions you have, such as heart or kidney disease. How do carbohydrates affect me? Carbohydrates, also called carbs, affect your blood glucose level more than any other type of food. Eating carbs naturally raises the amount of glucose in your blood. Carb counting is a method for keeping track of how many carbs you eat. Counting carbs is important to keep your blood glucose at a healthy level, especially if you use insulin or take certain oral diabetes medicines. It is important to know how many carbs you can safely have in each meal. This is different for every person. Your dietitian can help you calculate how many carbs you should have at each meal and for each snack. How does alcohol affect me? Alcohol can cause a sudden decrease in blood glucose (hypoglycemia), especially if you use insulin or take certain oral diabetes medicines. Hypoglycemia can be a life-threatening condition. Symptoms of hypoglycemia, such as sleepiness, dizziness, and confusion, are similar to symptoms of having too much  alcohol.  Do not drink alcohol if: ? Your health care provider tells you not to drink. ? You are pregnant, may be pregnant, or are planning to become pregnant.  If you drink alcohol: ? Do not drink on an empty stomach. ? Limit how much you use to:  0-1 drink a day for women.  0-2 drinks a day for men. ? Be aware of how much alcohol is in your drink. In the [place] one drink equals one 12 oz bottle of beer (355 mL), one 5 oz glass of wine (148 mL), or one 1 oz glass of hard liquor (44 mL). ? Keep yourself hydrated with water, diet soda, or unsweetened iced tea.  Keep in mind that regular soda, juice, and other mixers may contain a lot of sugar and must be counted as carbs. What are tips for following this plan? Reading food labels  Start by checking the serving size on the "Nutrition Facts" label of packaged foods and drinks. The amount of calories, carbs, fats, and other nutrients listed on the label is based on one serving of the item. Many items contain more than one serving per package.  Check the total grams (g) of carbs in one serving. You can calculate the number of servings of carbs in one serving by dividing the total carbs by 15. For example, if a food has 30 g of total carbs per serving, it would be equal to 2 servings of carbs.  Check the number of grams (g) of saturated fats and trans fats in one serving. Choose foods that have   a low amount or none of these fats.  Check the number of milligrams (mg) of salt (sodium) in one serving. Most people should limit total sodium intake to less than 2,300 mg per day.  Always check the nutrition information of foods labeled as "low-fat" or "nonfat." These foods may be higher in added sugar or refined carbs and should be avoided.  Talk to your dietitian to identify your daily goals for nutrients listed on the label. Shopping  Avoid buying canned, pre-made, or processed foods. These foods tend to be high in fat, sodium, and added  sugar.  Shop around the outside edge of the grocery store. This is where you will most often find fresh fruits and vegetables, bulk grains, fresh meats, and fresh dairy. Cooking  Use low-heat cooking methods, such as baking, instead of high-heat cooking methods like deep frying.  Cook using healthy oils, such as olive, canola, or sunflower oil.  Avoid cooking with butter, cream, or high-fat meats. Meal planning  Eat meals and snacks regularly, preferably at the same times every day. Avoid going long periods of time without eating.  Eat foods that are high in fiber, such as fresh fruits, vegetables, beans, and whole grains. Talk with your dietitian about how many servings of carbs you can eat at each meal.  Eat 4-6 oz (112-168 g) of lean protein each day, such as lean meat, chicken, fish, eggs, or tofu. One ounce (oz) of lean protein is equal to: ? 1 oz (28 g) of meat, chicken, or fish. ? 1 egg. ?  cup (62 g) of tofu.  Eat some foods each day that contain healthy fats, such as avocado, nuts, seeds, and fish.   What foods should I eat? Fruits Berries. Apples. Oranges. Peaches. Apricots. Plums. Grapes. Mango. Papaya. Pomegranate. Kiwi. Cherries. Vegetables Lettuce. Spinach. Leafy greens, including kale, chard, collard greens, and mustard greens. Beets. Cauliflower. Cabbage. Broccoli. Carrots. Green beans. Tomatoes. Peppers. Onions. Cucumbers. Brussels sprouts. Grains Whole grains, such as whole-wheat or whole-grain bread, crackers, tortillas, cereal, and pasta. Unsweetened oatmeal. Quinoa. Brown or wild rice. Meats and other proteins Seafood. Poultry without skin. Lean cuts of poultry and beef. Tofu. Nuts. Seeds. Dairy Low-fat or fat-free dairy products such as milk, yogurt, and cheese. The items listed above may not be a complete list of foods and beverages you can eat. Contact a dietitian for more information. What foods should I avoid? Fruits Fruits canned with  syrup. Vegetables Canned vegetables. Frozen vegetables with butter or cream sauce. Grains Refined white flour and flour products such as bread, pasta, snack foods, and cereals. Avoid all processed foods. Meats and other proteins Fatty cuts of meat. Poultry with skin. Breaded or fried meats. Processed meat. Avoid saturated fats. Dairy Full-fat yogurt, cheese, or milk. Beverages Sweetened drinks, such as soda or iced tea. The items listed above may not be a complete list of foods and beverages you should avoid. Contact a dietitian for more information. Questions to ask a health care provider  Do I need to meet with a diabetes educator?  Do I need to meet with a dietitian?  What number can I call if I have questions?  When are the best times to check my blood glucose? Where to find more information:  American Diabetes Association: diabetes.org  Academy of Nutrition and Dietetics: www.eatright.org  National Institute of Diabetes and Digestive and Kidney Diseases: www.niddk.nih.gov  Association of Diabetes Care and Education Specialists: www.diabeteseducator.org Summary  It is important to have healthy eating   habits because your blood sugar (glucose) levels are greatly affected by what you eat and drink.  A healthy meal plan will help you control your blood glucose and maintain a healthy lifestyle.  Your health care provider may recommend that you work with a dietitian to make a meal plan that is best for you.  Keep in mind that carbohydrates (carbs) and alcohol have immediate effects on your blood glucose levels. It is important to count carbs and to use alcohol carefully. This information is not intended to replace advice given to you by your health care provider. Make sure you discuss any questions you have with your health care provider. Document Revised: 04/23/2019 Document Reviewed: 04/23/2019 Elsevier Patient Education  2021 Elsevier Inc.  

## 2020-08-27 NOTE — Progress Notes (Signed)
Subjective:    Patient ID: Matthew Pennington, male    DOB: December 07, 1965, 55 y.o.   MRN: [id number]  Chief Complaint  Patient presents with  . Diabetes    Pt presents to the office today for chronic follow up. He is followed by Neurologists for Trigeminal neuralgia. She has given him oxycodone as needed for flare ups. He only takes it 3-4 times a year.   He does take Ultram as needed for chronic back pain. He only takes this 1-2 times a month. He never mixes the two.  He is currently going to chiropractor once a week for back and shoulder pain.  Diabetes He presents for his follow-up diabetic visit. He has type 2 diabetes mellitus. His disease course has been stable. There are no hypoglycemic associated symptoms. Pertinent negatives for diabetes include no blurred vision and no foot paresthesias. There are no hypoglycemic complications. Symptoms are stable. Diabetic complications include peripheral neuropathy. Pertinent negatives for diabetic complications include no heart disease. Risk factors for coronary artery disease include dyslipidemia, diabetes mellitus, male sex, hypertension and sedentary lifestyle. He is following a generally unhealthy diet. His overall blood glucose range is 110-130 mg/dl. Eye exam is current.  Back Pain This is a chronic problem. The current episode started more than 1 year ago. The problem occurs intermittently. The pain is present in the lumbar spine. The quality of the pain is described as aching. The pain radiates to the left thigh. The pain is at a severity of 8/10. The pain is moderate. Associated symptoms include numbness. Pertinent negatives include no dysuria. He has tried analgesics, bed rest, muscle relaxant and NSAIDs for the symptoms. The treatment provided mild relief.  Hypertension This is a chronic problem. The current episode started more than 1 year ago. The problem has been resolved since onset. The problem is controlled. Pertinent negatives include  no blurred vision, malaise/fatigue, peripheral edema or shortness of breath. The current treatment provides moderate improvement.  Hyperlipidemia This is a chronic problem. The current episode started more than 1 year ago. The problem is uncontrolled. Exacerbating diseases include obesity. Pertinent negatives include no shortness of breath. Current antihyperlipidemic treatment includes statins and ezetimibe. The current treatment provides moderate improvement of lipids. Risk factors for coronary artery disease include dyslipidemia, male sex and hypertension.  Gastroesophageal Reflux He complains of belching and heartburn. This is a chronic problem. The current episode started more than 1 year ago. The problem occurs occasionally. The problem has been resolved. He has tried a PPI for the symptoms. The treatment provided moderate relief.      Review of Systems  Constitutional: Negative for malaise/fatigue.  Eyes: Negative for blurred vision.  Respiratory: Negative for shortness of breath.   Gastrointestinal: Positive for heartburn.  Genitourinary: Negative for dysuria.  Musculoskeletal: Positive for back pain.  Neurological: Positive for numbness.  All other systems reviewed and are negative.      Objective:   Physical Exam Vitals reviewed.  Constitutional:      General: He is not in acute distress.    Appearance: He is well-developed.  HENT:     Head: Normocephalic.     Right Ear: Tympanic membrane normal.     Left Ear: Tympanic membrane normal.  Eyes:     General:        Right eye: No discharge.        Left eye: No discharge.     Pupils: Pupils are equal, round, and reactive to light.  Neck:  Thyroid: No thyromegaly.  Cardiovascular:     Rate and Rhythm: Normal rate and regular rhythm.     Heart sounds: Normal heart sounds. No murmur heard.   Pulmonary:     Effort: Pulmonary effort is normal. No respiratory distress.     Breath sounds: Normal breath sounds. No  wheezing.  Abdominal:     General: Bowel sounds are normal. There is no distension.     Palpations: Abdomen is soft.     Tenderness: There is no abdominal tenderness.  Musculoskeletal:        General: No tenderness.     Cervical back: Normal range of motion and neck supple.     Comments: Pain in lumbar with flexion and extension, using cane to walk.   Skin:    General: Skin is warm and dry.     Findings: No erythema or rash.  Neurological:     Mental Status: He is alert and oriented to person, place, and time.     Cranial Nerves: No cranial nerve deficit.     Deep Tendon Reflexes: Reflexes are normal and symmetric.  Psychiatric:        Behavior: Behavior normal.        Thought Content: Thought content normal.        Judgment: Judgment normal.    Diabetic Foot Exam - Simple   Simple Foot Form Diabetic Foot exam was performed with the following findings: Yes 08/27/2020 10:41 AM  Visual Inspection No deformities, no ulcerations, no other skin breakdown bilaterally: Yes Sensation Testing See comments: Yes Pulse Check Posterior Tibialis and Dorsalis pulse intact bilaterally: Yes Comments Negative monofilament on toes and ball of foot, positive every where else.        BP 117/79   Pulse 85   Temp 98 F (36.7 C) (Temporal)   Ht 6\' 1"  (1.854 m)   Wt 294 lb 6.4 oz (133.5 kg)   BMI 38.84 kg/m      Assessment & Plan:  Matthew Pennington comes in today with chief complaint of Diabetes   Diagnosis and orders addressed:  1. Trigeminal neuralgia - traMADol (ULTRAM) 50 MG tablet; Take 1 tablet (50 mg total) by mouth daily.  Dispense: 20 tablet; Refill: 2 - gabapentin (NEURONTIN) 300 MG capsule; Take 1 capsule (300 mg total) by mouth 3 (three) times daily.  Dispense: 270 capsule; Refill: 1 - ibuprofen (ADVIL) 800 MG tablet; Take 1 tablet (800 mg total) by mouth daily.  Dispense: 90 tablet; Refill: 5 - CMP14+EGFR - CBC with Differential/Platelet  2. Chronic bilateral low back pain  without sciatica - traMADol (ULTRAM) 50 MG tablet; Take 1 tablet (50 mg total) by mouth daily.  Dispense: 20 tablet; Refill: 2 - CMP14+EGFR - CBC with Differential/Platelet  3. Diabetes mellitus without complication (HCC) - metFORMIN (GLUCOPHAGE) 500 MG tablet; TAKE 2 TABLETS BY MOUTH ONCE DAILY WITH BREAKFAST AND 1 TABLET IN THE EVENING  Dispense: 270 tablet; Refill: 0 - empagliflozin (JARDIANCE) 25 MG TABS tablet; Take 1 tablet (25 mg total) by mouth daily.  Dispense: 90 tablet; Refill: 1 - CMP14+EGFR - CBC with Differential/Platelet - Bayer DCA Hb A1c Waived  4. Hyperlipidemia associated with type 2 diabetes mellitus (HCC) - ezetimibe (ZETIA) 10 MG tablet; Take 1 tablet (10 mg total) by mouth daily.  Dispense: 90 tablet; Refill: 1 - CMP14+EGFR - CBC with Differential/Platelet  5. Hypertension associated with diabetes (HCC) - enalapril (VASOTEC) 20 MG tablet; Take 1 tablet (20 mg total) by mouth 2 (two)  times daily.  Dispense: 180 tablet; Refill: 1 - CMP14+EGFR - CBC with Differential/Platelet  6. Leg cramps, sleep related - rOPINIRole (REQUIP) 0.25 MG tablet; TAKE 1 TO 2 TABLETS BY MOUTH AT BEDTIME FOR  RESTLESS  LEGS  Dispense: 180 tablet; Refill: 1 - CMP14+EGFR - CBC with Differential/Platelet  7. OSA (obstructive sleep apnea) - CMP14+EGFR - CBC with Differential/Platelet  8. Gastroesophageal reflux disease, unspecified whether esophagitis present - CMP14+EGFR - CBC with Differential/Platelet  9. Type 2 diabetes mellitus with other specified complication, unspecified whether long term insulin use (HCC) - CMP14+EGFR - CBC with Differential/Platelet  10. GAD (generalized anxiety disorder) - CMP14+EGFR - CBC with Differential/Platelet  11. Controlled substance agreement signed - CMP14+EGFR - CBC with Differential/Platelet  12. Moderate recurrent major depression (HCC) - CMP14+EGFR - CBC with Differential/Platelet   Labs pending Patient reviewed in [place] controlled  database, no flags noted. Contract and drug screen are up to date.  Health Maintenance reviewed Diet and exercise encouraged  Follow up plan: 3 months    [name], FNP

## 2020-08-28 LAB — CMP14+EGFR
ALT: 93 IU/L — ABNORMAL HIGH (ref 0–44)
AST: 43 IU/L — ABNORMAL HIGH (ref 0–40)
Albumin/Globulin Ratio: 1.7 (ref 1.2–2.2)
Albumin: 4.7 g/dL (ref 3.8–4.9)
Alkaline Phosphatase: 76 IU/L (ref 44–121)
BUN/Creatinine Ratio: 13 (ref 9–20)
BUN: 15 mg/dL (ref 6–24)
Bilirubin Total: 0.4 mg/dL (ref 0.0–1.2)
CO2: 19 mmol/L — ABNORMAL LOW (ref 20–29)
Calcium: 10.1 mg/dL (ref 8.7–10.2)
Chloride: 101 mmol/L (ref 96–106)
Creatinine, Ser: 1.12 mg/dL (ref 0.76–1.27)
Globulin, Total: 2.8 g/dL (ref 1.5–4.5)
Glucose: 118 mg/dL — ABNORMAL HIGH (ref 65–99)
Potassium: 5.1 mmol/L (ref 3.5–5.2)
Sodium: 139 mmol/L (ref 134–144)
Total Protein: 7.5 g/dL (ref 6.0–8.5)
eGFR: 78 mL/min/{1.73_m2} (ref >59)

## 2020-08-28 LAB — CBC WITH DIFFERENTIAL/PLATELET
Basophils Absolute: 0.1 10*3/uL (ref 0.0–0.2)
Basos: 1 %
EOS (ABSOLUTE): 0.2 10*3/uL (ref 0.0–0.4)
Eos: 3 %
Hematocrit: 47.8 % (ref 37.5–51.0)
Hemoglobin: 16.3 g/dL (ref 13.0–17.7)
Immature Grans (Abs): 0 10*3/uL (ref 0.0–0.1)
Immature Granulocytes: 0 %
Lymphocytes Absolute: 2.9 10*3/uL (ref 0.7–3.1)
Lymphs: 43 %
MCH: 30.8 pg (ref 26.6–33.0)
MCHC: 34.1 g/dL (ref 31.5–35.7)
MCV: 90 fL (ref 79–97)
Monocytes Absolute: 0.6 10*3/uL (ref 0.1–0.9)
Monocytes: 8 %
Neutrophils Absolute: 3.1 10*3/uL (ref 1.4–7.0)
Neutrophils: 45 %
Platelets: 290 10*3/uL (ref 150–450)
RBC: 5.3 x10E6/uL (ref 4.14–5.80)
RDW: 12.8 % (ref 11.6–15.4)
WBC: 6.8 10*3/uL (ref 3.4–10.8)

## 2020-08-31 ENCOUNTER — Other Ambulatory Visit: Payer: Self-pay | Admitting: Family

## 2020-08-31 DIAGNOSIS — R748 Abnormal levels of other serum enzymes: Secondary | ICD-10-CM

## 2020-09-02 ENCOUNTER — Telehealth: Payer: Self-pay | Admitting: Family

## 2020-09-02 ENCOUNTER — Encounter: Payer: Self-pay | Admitting: Internal Medicine

## 2020-09-02 NOTE — Telephone Encounter (Signed)
Pt would like to talk to someone about his elevated liver enzymes

## 2020-09-02 NOTE — Telephone Encounter (Signed)
Called and spoke with patient about lab works.

## 2020-09-17 DIAGNOSIS — M47812 Spondylosis without myelopathy or radiculopathy, cervical region: Secondary | ICD-10-CM | POA: Diagnosis not present

## 2020-09-17 DIAGNOSIS — M542 Cervicalgia: Secondary | ICD-10-CM | POA: Diagnosis not present

## 2020-09-17 DIAGNOSIS — M9902 Segmental and somatic dysfunction of thoracic region: Secondary | ICD-10-CM | POA: Diagnosis not present

## 2020-09-17 DIAGNOSIS — M9901 Segmental and somatic dysfunction of cervical region: Secondary | ICD-10-CM | POA: Diagnosis not present

## 2020-09-17 DIAGNOSIS — M9903 Segmental and somatic dysfunction of lumbar region: Secondary | ICD-10-CM | POA: Diagnosis not present

## 2020-09-25 DIAGNOSIS — M542 Cervicalgia: Secondary | ICD-10-CM | POA: Diagnosis not present

## 2020-09-25 DIAGNOSIS — M9901 Segmental and somatic dysfunction of cervical region: Secondary | ICD-10-CM | POA: Diagnosis not present

## 2020-09-25 DIAGNOSIS — M9902 Segmental and somatic dysfunction of thoracic region: Secondary | ICD-10-CM | POA: Diagnosis not present

## 2020-09-25 DIAGNOSIS — M47812 Spondylosis without myelopathy or radiculopathy, cervical region: Secondary | ICD-10-CM | POA: Diagnosis not present

## 2020-09-25 DIAGNOSIS — M9903 Segmental and somatic dysfunction of lumbar region: Secondary | ICD-10-CM | POA: Diagnosis not present

## 2020-10-05 DIAGNOSIS — G4733 Obstructive sleep apnea (adult) (pediatric): Secondary | ICD-10-CM | POA: Diagnosis not present

## 2020-10-09 DIAGNOSIS — M9901 Segmental and somatic dysfunction of cervical region: Secondary | ICD-10-CM | POA: Diagnosis not present

## 2020-10-09 DIAGNOSIS — M542 Cervicalgia: Secondary | ICD-10-CM | POA: Diagnosis not present

## 2020-10-09 DIAGNOSIS — S338XXA Sprain of other parts of lumbar spine and pelvis, initial encounter: Secondary | ICD-10-CM | POA: Diagnosis not present

## 2020-10-09 DIAGNOSIS — M47812 Spondylosis without myelopathy or radiculopathy, cervical region: Secondary | ICD-10-CM | POA: Diagnosis not present

## 2020-10-09 DIAGNOSIS — M9902 Segmental and somatic dysfunction of thoracic region: Secondary | ICD-10-CM | POA: Diagnosis not present

## 2020-10-09 DIAGNOSIS — S29012A Strain of muscle and tendon of back wall of thorax, initial encounter: Secondary | ICD-10-CM | POA: Diagnosis not present

## 2020-10-09 DIAGNOSIS — M47816 Spondylosis without myelopathy or radiculopathy, lumbar region: Secondary | ICD-10-CM | POA: Diagnosis not present

## 2020-10-09 DIAGNOSIS — M9903 Segmental and somatic dysfunction of lumbar region: Secondary | ICD-10-CM | POA: Diagnosis not present

## 2020-10-19 ENCOUNTER — Ambulatory Visit: Payer: Medicare Other | Admitting: Nurse Practitioner

## 2020-10-19 DIAGNOSIS — H6693 Otitis media, unspecified, bilateral: Secondary | ICD-10-CM | POA: Diagnosis not present

## 2020-10-19 DIAGNOSIS — H60503 Unspecified acute noninfective otitis externa, bilateral: Secondary | ICD-10-CM | POA: Diagnosis not present

## 2020-10-22 ENCOUNTER — Ambulatory Visit: Payer: Medicare Other | Admitting: Internal Medicine

## 2020-11-06 DIAGNOSIS — M47812 Spondylosis without myelopathy or radiculopathy, cervical region: Secondary | ICD-10-CM | POA: Diagnosis not present

## 2020-11-06 DIAGNOSIS — S338XXA Sprain of other parts of lumbar spine and pelvis, initial encounter: Secondary | ICD-10-CM | POA: Diagnosis not present

## 2020-11-06 DIAGNOSIS — S29012A Strain of muscle and tendon of back wall of thorax, initial encounter: Secondary | ICD-10-CM | POA: Diagnosis not present

## 2020-11-06 DIAGNOSIS — M9903 Segmental and somatic dysfunction of lumbar region: Secondary | ICD-10-CM | POA: Diagnosis not present

## 2020-11-06 DIAGNOSIS — M9901 Segmental and somatic dysfunction of cervical region: Secondary | ICD-10-CM | POA: Diagnosis not present

## 2020-11-06 DIAGNOSIS — M542 Cervicalgia: Secondary | ICD-10-CM | POA: Diagnosis not present

## 2020-11-06 DIAGNOSIS — M9902 Segmental and somatic dysfunction of thoracic region: Secondary | ICD-10-CM | POA: Diagnosis not present

## 2020-11-06 DIAGNOSIS — M47816 Spondylosis without myelopathy or radiculopathy, lumbar region: Secondary | ICD-10-CM | POA: Diagnosis not present

## 2020-11-20 DIAGNOSIS — S29012A Strain of muscle and tendon of back wall of thorax, initial encounter: Secondary | ICD-10-CM | POA: Diagnosis not present

## 2020-11-20 DIAGNOSIS — M47812 Spondylosis without myelopathy or radiculopathy, cervical region: Secondary | ICD-10-CM | POA: Diagnosis not present

## 2020-11-20 DIAGNOSIS — M9901 Segmental and somatic dysfunction of cervical region: Secondary | ICD-10-CM | POA: Diagnosis not present

## 2020-11-20 DIAGNOSIS — M9903 Segmental and somatic dysfunction of lumbar region: Secondary | ICD-10-CM | POA: Diagnosis not present

## 2020-11-20 DIAGNOSIS — M47816 Spondylosis without myelopathy or radiculopathy, lumbar region: Secondary | ICD-10-CM | POA: Diagnosis not present

## 2020-11-20 DIAGNOSIS — S338XXA Sprain of other parts of lumbar spine and pelvis, initial encounter: Secondary | ICD-10-CM | POA: Diagnosis not present

## 2020-11-20 DIAGNOSIS — M542 Cervicalgia: Secondary | ICD-10-CM | POA: Diagnosis not present

## 2020-11-20 DIAGNOSIS — M9902 Segmental and somatic dysfunction of thoracic region: Secondary | ICD-10-CM | POA: Diagnosis not present

## 2020-11-27 DIAGNOSIS — M9903 Segmental and somatic dysfunction of lumbar region: Secondary | ICD-10-CM | POA: Diagnosis not present

## 2020-11-27 DIAGNOSIS — M9902 Segmental and somatic dysfunction of thoracic region: Secondary | ICD-10-CM | POA: Diagnosis not present

## 2020-11-27 DIAGNOSIS — M9901 Segmental and somatic dysfunction of cervical region: Secondary | ICD-10-CM | POA: Diagnosis not present

## 2020-11-27 DIAGNOSIS — S29012A Strain of muscle and tendon of back wall of thorax, initial encounter: Secondary | ICD-10-CM | POA: Diagnosis not present

## 2020-11-27 DIAGNOSIS — S338XXA Sprain of other parts of lumbar spine and pelvis, initial encounter: Secondary | ICD-10-CM | POA: Diagnosis not present

## 2020-11-27 DIAGNOSIS — M542 Cervicalgia: Secondary | ICD-10-CM | POA: Diagnosis not present

## 2020-11-27 DIAGNOSIS — M47816 Spondylosis without myelopathy or radiculopathy, lumbar region: Secondary | ICD-10-CM | POA: Diagnosis not present

## 2020-11-27 DIAGNOSIS — M47812 Spondylosis without myelopathy or radiculopathy, cervical region: Secondary | ICD-10-CM | POA: Diagnosis not present

## 2020-12-04 DIAGNOSIS — S338XXA Sprain of other parts of lumbar spine and pelvis, initial encounter: Secondary | ICD-10-CM | POA: Diagnosis not present

## 2020-12-04 DIAGNOSIS — M47812 Spondylosis without myelopathy or radiculopathy, cervical region: Secondary | ICD-10-CM | POA: Diagnosis not present

## 2020-12-04 DIAGNOSIS — M9903 Segmental and somatic dysfunction of lumbar region: Secondary | ICD-10-CM | POA: Diagnosis not present

## 2020-12-04 DIAGNOSIS — M9902 Segmental and somatic dysfunction of thoracic region: Secondary | ICD-10-CM | POA: Diagnosis not present

## 2020-12-04 DIAGNOSIS — M9901 Segmental and somatic dysfunction of cervical region: Secondary | ICD-10-CM | POA: Diagnosis not present

## 2020-12-04 DIAGNOSIS — M47816 Spondylosis without myelopathy or radiculopathy, lumbar region: Secondary | ICD-10-CM | POA: Diagnosis not present

## 2020-12-04 DIAGNOSIS — S29012A Strain of muscle and tendon of back wall of thorax, initial encounter: Secondary | ICD-10-CM | POA: Diagnosis not present

## 2020-12-04 DIAGNOSIS — M542 Cervicalgia: Secondary | ICD-10-CM | POA: Diagnosis not present

## 2020-12-14 DIAGNOSIS — M9902 Segmental and somatic dysfunction of thoracic region: Secondary | ICD-10-CM | POA: Diagnosis not present

## 2020-12-14 DIAGNOSIS — M9901 Segmental and somatic dysfunction of cervical region: Secondary | ICD-10-CM | POA: Diagnosis not present

## 2020-12-14 DIAGNOSIS — S29012A Strain of muscle and tendon of back wall of thorax, initial encounter: Secondary | ICD-10-CM | POA: Diagnosis not present

## 2020-12-14 DIAGNOSIS — S338XXA Sprain of other parts of lumbar spine and pelvis, initial encounter: Secondary | ICD-10-CM | POA: Diagnosis not present

## 2020-12-14 DIAGNOSIS — M47816 Spondylosis without myelopathy or radiculopathy, lumbar region: Secondary | ICD-10-CM | POA: Diagnosis not present

## 2020-12-14 DIAGNOSIS — M47812 Spondylosis without myelopathy or radiculopathy, cervical region: Secondary | ICD-10-CM | POA: Diagnosis not present

## 2020-12-14 DIAGNOSIS — M542 Cervicalgia: Secondary | ICD-10-CM | POA: Diagnosis not present

## 2020-12-14 DIAGNOSIS — M9903 Segmental and somatic dysfunction of lumbar region: Secondary | ICD-10-CM | POA: Diagnosis not present

## 2020-12-18 DIAGNOSIS — G4733 Obstructive sleep apnea (adult) (pediatric): Secondary | ICD-10-CM | POA: Diagnosis not present

## 2020-12-28 DIAGNOSIS — M47812 Spondylosis without myelopathy or radiculopathy, cervical region: Secondary | ICD-10-CM | POA: Diagnosis not present

## 2020-12-28 DIAGNOSIS — M542 Cervicalgia: Secondary | ICD-10-CM | POA: Diagnosis not present

## 2020-12-28 DIAGNOSIS — M9902 Segmental and somatic dysfunction of thoracic region: Secondary | ICD-10-CM | POA: Diagnosis not present

## 2020-12-28 DIAGNOSIS — M9903 Segmental and somatic dysfunction of lumbar region: Secondary | ICD-10-CM | POA: Diagnosis not present

## 2020-12-28 DIAGNOSIS — M47816 Spondylosis without myelopathy or radiculopathy, lumbar region: Secondary | ICD-10-CM | POA: Diagnosis not present

## 2020-12-28 DIAGNOSIS — S338XXA Sprain of other parts of lumbar spine and pelvis, initial encounter: Secondary | ICD-10-CM | POA: Diagnosis not present

## 2020-12-28 DIAGNOSIS — M9901 Segmental and somatic dysfunction of cervical region: Secondary | ICD-10-CM | POA: Diagnosis not present

## 2020-12-28 DIAGNOSIS — S29012A Strain of muscle and tendon of back wall of thorax, initial encounter: Secondary | ICD-10-CM | POA: Diagnosis not present

## 2020-12-29 ENCOUNTER — Other Ambulatory Visit: Payer: Self-pay | Admitting: *Deleted

## 2020-12-29 DIAGNOSIS — E119 Type 2 diabetes mellitus without complications: Secondary | ICD-10-CM

## 2020-12-29 NOTE — Telephone Encounter (Signed)
Haws. NTBS 3 mos ckup was to be in June mail order not sent

## 2020-12-31 ENCOUNTER — Other Ambulatory Visit: Payer: Self-pay | Admitting: *Deleted

## 2020-12-31 DIAGNOSIS — I152 Hypertension secondary to endocrine disorders: Secondary | ICD-10-CM

## 2020-12-31 DIAGNOSIS — E119 Type 2 diabetes mellitus without complications: Secondary | ICD-10-CM

## 2020-12-31 DIAGNOSIS — E1159 Type 2 diabetes mellitus with other circulatory complications: Secondary | ICD-10-CM

## 2020-12-31 DIAGNOSIS — K219 Gastro-esophageal reflux disease without esophagitis: Secondary | ICD-10-CM

## 2020-12-31 NOTE — Telephone Encounter (Signed)
[facility] NTBS 3 mos ckup was to be in June mail order not sent

## 2021-01-08 ENCOUNTER — Ambulatory Visit (INDEPENDENT_AMBULATORY_CARE_PROVIDER_SITE_OTHER): Payer: Medicare Other | Admitting: Family

## 2021-01-08 ENCOUNTER — Other Ambulatory Visit: Payer: Self-pay

## 2021-01-08 ENCOUNTER — Encounter: Payer: Self-pay | Admitting: Family

## 2021-01-08 VITALS — BP 102/69 | HR 72 | Temp 98.3°F | Ht 73.0 in | Wt 289.8 lb

## 2021-01-08 DIAGNOSIS — K760 Fatty (change of) liver, not elsewhere classified: Secondary | ICD-10-CM | POA: Diagnosis not present

## 2021-01-08 DIAGNOSIS — I152 Hypertension secondary to endocrine disorders: Secondary | ICD-10-CM

## 2021-01-08 DIAGNOSIS — F331 Major depressive disorder, recurrent, moderate: Secondary | ICD-10-CM

## 2021-01-08 DIAGNOSIS — Z79899 Other long term (current) drug therapy: Secondary | ICD-10-CM | POA: Diagnosis not present

## 2021-01-08 DIAGNOSIS — K219 Gastro-esophageal reflux disease without esophagitis: Secondary | ICD-10-CM

## 2021-01-08 DIAGNOSIS — E1159 Type 2 diabetes mellitus with other circulatory complications: Secondary | ICD-10-CM | POA: Diagnosis not present

## 2021-01-08 DIAGNOSIS — E1169 Type 2 diabetes mellitus with other specified complication: Secondary | ICD-10-CM | POA: Diagnosis not present

## 2021-01-08 DIAGNOSIS — M545 Low back pain, unspecified: Secondary | ICD-10-CM

## 2021-01-08 DIAGNOSIS — F411 Generalized anxiety disorder: Secondary | ICD-10-CM

## 2021-01-08 DIAGNOSIS — G4762 Sleep related leg cramps: Secondary | ICD-10-CM

## 2021-01-08 DIAGNOSIS — G8929 Other chronic pain: Secondary | ICD-10-CM

## 2021-01-08 DIAGNOSIS — E785 Hyperlipidemia, unspecified: Secondary | ICD-10-CM

## 2021-01-08 DIAGNOSIS — G4733 Obstructive sleep apnea (adult) (pediatric): Secondary | ICD-10-CM

## 2021-01-08 DIAGNOSIS — G500 Trigeminal neuralgia: Secondary | ICD-10-CM

## 2021-01-08 DIAGNOSIS — E119 Type 2 diabetes mellitus without complications: Secondary | ICD-10-CM | POA: Diagnosis not present

## 2021-01-08 MED ORDER — EZETIMIBE 10 MG PO TABS: 10.0000 mg | ORAL_TABLET | Freq: Every day | ORAL | 1 refills | Status: DC

## 2021-01-08 MED ORDER — GABAPENTIN 300 MG PO CAPS: 300.0000 mg | ORAL_CAPSULE | Freq: Three times a day (TID) | ORAL | 1 refills | Status: DC

## 2021-01-08 MED ORDER — EMPAGLIFLOZIN 25 MG PO TABS: 25.0000 mg | ORAL_TABLET | Freq: Every day | ORAL | 1 refills | Status: DC

## 2021-01-08 MED ORDER — ENALAPRIL MALEATE 20 MG PO TABS: 20.0000 mg | ORAL_TABLET | Freq: Every day | ORAL | 1 refills | Status: DC

## 2021-01-08 MED ORDER — PANTOPRAZOLE SODIUM 40 MG PO TBEC: 40.0000 mg | DELAYED_RELEASE_TABLET | Freq: Every day | ORAL | 1 refills | Status: DC

## 2021-01-08 MED ORDER — METFORMIN HCL 500 MG PO TABS: ORAL_TABLET | ORAL | 0 refills | Status: DC

## 2021-01-08 MED ORDER — ROPINIROLE HCL 0.25 MG PO TABS: ORAL_TABLET | ORAL | 1 refills | Status: DC

## 2021-01-08 MED ORDER — TRAMADOL HCL 50 MG PO TABS: 50.0000 mg | ORAL_TABLET | Freq: Every day | ORAL | 2 refills | Status: DC

## 2021-01-08 MED ORDER — OXCARBAZEPINE 600 MG PO TABS: 600.0000 mg | ORAL_TABLET | Freq: Three times a day (TID) | ORAL | 2 refills | Status: DC

## 2021-01-08 NOTE — Progress Notes (Signed)
Subjective:    Patient ID: Matthew Pennington, male    DOB: 21-Aug-1965, 55 y.o.   MRN: [id number]  Chief Complaint  Patient presents with   Medical Management of Chronic Issues    Right knee pain for months no injury, back pain running down left leg   Pt presents to the office today for chronic follow up. He is followed by Neurologists for Trigeminal neuralgia. She has given him oxycodone as needed for flare ups. He only takes it 3-4 times a year.     He does take Ultram as needed for chronic back pain. He only takes this 1-2 times a month. He never mixes the two.    He is currently going to chiropractor once a week for back and shoulder pain.   He reports he has had a few times with light headedness and dizziness with walking.  Hypertension This is a chronic problem. The current episode started more than 1 year ago. The problem has been resolved since onset. The problem is controlled. Pertinent negatives include no blurred vision, malaise/fatigue, peripheral edema or shortness of breath. Risk factors for coronary artery disease include dyslipidemia, diabetes mellitus, obesity and male gender. The current treatment provides moderate improvement.  Gastroesophageal Reflux He complains of belching and heartburn. This is a chronic problem. The current episode started more than 1 year ago. The problem occurs occasionally. Risk factors include obesity. He has tried a PPI for the symptoms. The treatment provided moderate relief.  Hyperlipidemia This is a chronic problem. The current episode started more than 1 year ago. Exacerbating diseases include obesity. Pertinent negatives include no shortness of breath. Current antihyperlipidemic treatment includes ezetimibe. The current treatment provides mild improvement of lipids. Risk factors for coronary artery disease include dyslipidemia, diabetes mellitus, male sex, hypertension and a sedentary lifestyle.  Diabetes He presents for his follow-up diabetic  visit. He has type 2 diabetes mellitus. Hypoglycemia symptoms include nervousness/anxiousness. Associated symptoms include foot paresthesias. Pertinent negatives for diabetes include no blurred vision. There are no hypoglycemic complications. Symptoms are stable. Risk factors for coronary artery disease include dyslipidemia, diabetes mellitus, male sex, hypertension and sedentary lifestyle. He is following a generally healthy diet. His overall blood glucose range is 140-180 mg/dl. An ACE inhibitor/angiotensin II receptor blocker is being taken. Eye exam is current.  Depression        This is a chronic problem.  The current episode started more than 1 year ago.   Associated symptoms include helplessness, hopelessness, irritable, restlessness and sad.  Past treatments include nothing. Anxiety Presents for follow-up visit. Symptoms include depressed mood, excessive worry, irritability, nervous/anxious behavior and restlessness. Patient reports no shortness of breath. Symptoms occur most days. The severity of symptoms is moderate.    OSA  Uses CPAP nightly. Stable.   Review of Systems  Constitutional:  Positive for irritability. Negative for malaise/fatigue.  Eyes:  Negative for blurred vision.  Respiratory:  Negative for shortness of breath.   Gastrointestinal:  Positive for heartburn.  Psychiatric/Behavioral:  Positive for depression. The patient is nervous/anxious.   All other systems reviewed and are negative.     Objective:   Physical Exam Vitals reviewed.  Constitutional:      General: He is irritable. He is not in acute distress.    Appearance: He is well-developed. He is obese.  HENT:     Head: Normocephalic.     Right Ear: Tympanic membrane normal.     Left Ear: Tympanic membrane normal.  Eyes:  General:        Right eye: No discharge.        Left eye: No discharge.     Pupils: Pupils are equal, round, and reactive to light.  Neck:     Thyroid: No thyromegaly.   Cardiovascular:     Rate and Rhythm: Normal rate and regular rhythm.     Heart sounds: Normal heart sounds. No murmur heard. Pulmonary:     Effort: Pulmonary effort is normal. No respiratory distress.     Breath sounds: Normal breath sounds. No wheezing.  Abdominal:     General: Bowel sounds are normal. There is no distension.     Palpations: Abdomen is soft.     Tenderness: There is no abdominal tenderness.  Musculoskeletal:        General: No tenderness. Normal range of motion.     Cervical back: Normal range of motion and neck supple.  Skin:    General: Skin is warm and dry.     Findings: No erythema or rash.  Neurological:     Mental Status: He is alert and oriented to person, place, and time.     Cranial Nerves: No cranial nerve deficit.     Deep Tendon Reflexes: Reflexes are normal and symmetric.  Psychiatric:        Behavior: Behavior normal.        Thought Content: Thought content normal.        Judgment: Judgment normal.      BP 100/64   Pulse 72   Temp 98.3 F (36.8 C) (Temporal)   Ht 6\' 1"  (1.854 m)   Wt 289 lb 12.8 oz (131.5 kg)   SpO2 96%   BMI 38.23 kg/m      Assessment & Plan:  Bodyn Robuck comes in today with chief complaint of Medical Management of Chronic Issues (Right knee pain for months no injury, back pain running down left leg)   Diagnosis and orders addressed:  1. Trigeminal neuralgia - traMADol (ULTRAM) 50 MG tablet; Take 1 tablet (50 mg total) by mouth daily.  Dispense: 20 tablet; Refill: 2 - gabapentin (NEURONTIN) 300 MG capsule; Take 1 capsule (300 mg total) by mouth 3 (three) times daily.  Dispense: 270 capsule; Refill: 1 - oxcarbazepine (TRILEPTAL) 600 MG tablet; Take 1 tablet (600 mg total) by mouth 3 (three) times daily.  Dispense: 270 tablet; Refill: 2 - BMP8+EGFR - Hepatic function panel  2. Chronic bilateral low back pain without sciatica - traMADol (ULTRAM) 50 MG tablet; Take 1 tablet (50 mg total) by mouth daily.  Dispense:  20 tablet; Refill: 2 - BMP8+EGFR - Hepatic function panel  3. Diabetes mellitus without complication (HCC) - metFORMIN (GLUCOPHAGE) 500 MG tablet; TAKE 2 TABLETS BY MOUTH ONCE DAILY WITH BREAKFAST AND 1 TABLET IN THE EVENING  Dispense: 270 tablet; Refill: 0 - empagliflozin (JARDIANCE) 25 MG TABS tablet; Take 1 tablet (25 mg total) by mouth daily.  Dispense: 90 tablet; Refill: 1 - BMP8+EGFR - Hepatic function panel  4. Gastroesophageal reflux disease, unspecified whether esophagitis present - pantoprazole (PROTONIX) 40 MG tablet; Take 1 tablet (40 mg total) by mouth daily.  Dispense: 90 tablet; Refill: 1 - BMP8+EGFR - Hepatic function panel  5. Hypertension associated with diabetes (HCC) Will decrease Vasotec to 20 mg daily from 40 mg  Pt will monitor daily and if increases may need to go back to higher dose - enalapril (VASOTEC) 20 MG tablet; Take 1 tablet (20 mg total) by mouth daily.  Dispense: 90 tablet; Refill: 1 - BMP8+EGFR - Hepatic function panel  6. Hyperlipidemia associated with type 2 diabetes mellitus (HCC) - ezetimibe (ZETIA) 10 MG tablet; Take 1 tablet (10 mg total) by mouth daily.  Dispense: 90 tablet; Refill: 1 - BMP8+EGFR - Hepatic function panel  7. Leg cramps, sleep related - rOPINIRole (REQUIP) 0.25 MG tablet; TAKE 1 TO 2 TABLETS BY MOUTH AT BEDTIME FOR  RESTLESS  LEGS  Dispense: 180 tablet; Refill: 1 - BMP8+EGFR - Hepatic function panel  8. OSA (obstructive sleep apnea) - BMP8+EGFR - Hepatic function panel  9. Hepatic steatosis - BMP8+EGFR - Hepatic function panel  10. Type 2 diabetes mellitus with other specified complication, unspecified whether long term insulin use (HCC) - BMP8+EGFR - Hepatic function panel  11. GAD (generalized anxiety disorder)  - BMP8+EGFR - Hepatic function panel  12. Moderate recurrent major depression (HCC)  - BMP8+EGFR - Hepatic function panel  13. Controlled substance agreement signed - BMP8+EGFR - Hepatic  function panel   Labs pending Patient reviewed in [place] controlled database, no flags noted. Contract and drug screen updated today.  Health Maintenance reviewed Diet and exercise encouraged  Follow up plan: 3 months   [name], FNP

## 2021-01-08 NOTE — Patient Instructions (Signed)

## 2021-01-09 LAB — HEPATIC FUNCTION PANEL
ALT: 83 IU/L — ABNORMAL HIGH (ref 0–44)
AST: 41 IU/L — ABNORMAL HIGH (ref 0–40)
Albumin: 4.9 g/dL (ref 3.8–4.9)
Alkaline Phosphatase: 68 IU/L (ref 44–121)
Bilirubin Total: 0.3 mg/dL (ref 0.0–1.2)
Bilirubin, Direct: 0.13 mg/dL (ref 0.00–0.40)
Total Protein: 7.6 g/dL (ref 6.0–8.5)

## 2021-01-09 LAB — BMP8+EGFR
BUN/Creatinine Ratio: 13 (ref 9–20)
BUN: 13 mg/dL (ref 6–24)
CO2: 21 mmol/L (ref 20–29)
Calcium: 10.1 mg/dL (ref 8.7–10.2)
Chloride: 101 mmol/L (ref 96–106)
Creatinine, Ser: 1 mg/dL (ref 0.76–1.27)
Glucose: 100 mg/dL — ABNORMAL HIGH (ref 65–99)
Potassium: 4.9 mmol/L (ref 3.5–5.2)
Sodium: 139 mmol/L (ref 134–144)
eGFR: 89 mL/min/{1.73_m2} (ref >59)

## 2021-01-13 LAB — TOXASSURE SELECT 13 (MW), URINE

## 2021-01-20 ENCOUNTER — Other Ambulatory Visit: Payer: Self-pay | Admitting: *Deleted

## 2021-01-20 DIAGNOSIS — E119 Type 2 diabetes mellitus without complications: Secondary | ICD-10-CM

## 2021-01-20 DIAGNOSIS — K219 Gastro-esophageal reflux disease without esophagitis: Secondary | ICD-10-CM

## 2021-01-20 DIAGNOSIS — I152 Hypertension secondary to endocrine disorders: Secondary | ICD-10-CM

## 2021-01-20 MED ORDER — ENALAPRIL MALEATE 20 MG PO TABS: 20.0000 mg | ORAL_TABLET | Freq: Every day | ORAL | 1 refills | Status: DC

## 2021-01-20 MED ORDER — PANTOPRAZOLE SODIUM 40 MG PO TBEC: 40.0000 mg | DELAYED_RELEASE_TABLET | Freq: Every day | ORAL | 1 refills | Status: DC

## 2021-01-20 NOTE — Addendum Note (Signed)
Addended by: Antonietta Barcelona D on: 01/20/2021 01:13 PM   Modules accepted: Orders

## 2021-01-21 DIAGNOSIS — M47812 Spondylosis without myelopathy or radiculopathy, cervical region: Secondary | ICD-10-CM | POA: Diagnosis not present

## 2021-01-21 DIAGNOSIS — S338XXA Sprain of other parts of lumbar spine and pelvis, initial encounter: Secondary | ICD-10-CM | POA: Diagnosis not present

## 2021-01-21 DIAGNOSIS — M47816 Spondylosis without myelopathy or radiculopathy, lumbar region: Secondary | ICD-10-CM | POA: Diagnosis not present

## 2021-01-21 DIAGNOSIS — M9901 Segmental and somatic dysfunction of cervical region: Secondary | ICD-10-CM | POA: Diagnosis not present

## 2021-01-21 DIAGNOSIS — S29012A Strain of muscle and tendon of back wall of thorax, initial encounter: Secondary | ICD-10-CM | POA: Diagnosis not present

## 2021-01-21 DIAGNOSIS — M9903 Segmental and somatic dysfunction of lumbar region: Secondary | ICD-10-CM | POA: Diagnosis not present

## 2021-01-21 DIAGNOSIS — M542 Cervicalgia: Secondary | ICD-10-CM | POA: Diagnosis not present

## 2021-01-21 DIAGNOSIS — M9902 Segmental and somatic dysfunction of thoracic region: Secondary | ICD-10-CM | POA: Diagnosis not present

## 2021-01-21 MED ORDER — METFORMIN HCL 500 MG PO TABS: ORAL_TABLET | ORAL | 0 refills | Status: DC

## 2021-01-21 NOTE — Addendum Note (Signed)
Addended by: Antonietta Barcelona D on: 01/21/2021 09:09 AM   Modules accepted: Orders

## 2021-01-28 DIAGNOSIS — M9903 Segmental and somatic dysfunction of lumbar region: Secondary | ICD-10-CM | POA: Diagnosis not present

## 2021-01-28 DIAGNOSIS — S338XXA Sprain of other parts of lumbar spine and pelvis, initial encounter: Secondary | ICD-10-CM | POA: Diagnosis not present

## 2021-01-28 DIAGNOSIS — M47816 Spondylosis without myelopathy or radiculopathy, lumbar region: Secondary | ICD-10-CM | POA: Diagnosis not present

## 2021-01-28 DIAGNOSIS — M9902 Segmental and somatic dysfunction of thoracic region: Secondary | ICD-10-CM | POA: Diagnosis not present

## 2021-01-28 DIAGNOSIS — M542 Cervicalgia: Secondary | ICD-10-CM | POA: Diagnosis not present

## 2021-01-28 DIAGNOSIS — M47812 Spondylosis without myelopathy or radiculopathy, cervical region: Secondary | ICD-10-CM | POA: Diagnosis not present

## 2021-01-28 DIAGNOSIS — M9901 Segmental and somatic dysfunction of cervical region: Secondary | ICD-10-CM | POA: Diagnosis not present

## 2021-01-28 DIAGNOSIS — S29012A Strain of muscle and tendon of back wall of thorax, initial encounter: Secondary | ICD-10-CM | POA: Diagnosis not present

## 2021-02-03 ENCOUNTER — Ambulatory Visit (INDEPENDENT_AMBULATORY_CARE_PROVIDER_SITE_OTHER): Payer: Medicare Other

## 2021-02-03 VITALS — Ht 73.0 in | Wt 289.0 lb

## 2021-02-03 DIAGNOSIS — Z0000 Encounter for general adult medical examination without abnormal findings: Secondary | ICD-10-CM | POA: Diagnosis not present

## 2021-02-03 NOTE — Patient Instructions (Signed)
Matthew Pennington , Thank you for taking time to come for your Medicare Wellness Visit. I appreciate your ongoing commitment to your health goals. Please review the following plan we discussed and let me know if I can assist you in the future.   Screening recommendations/referrals: Colonoscopy: Done 01/29/2020 Repeat in 10 years  Recommended yearly ophthalmology/optometry visit for glaucoma screening and checkup Recommended yearly dental visit for hygiene and checkup  Vaccinations: Influenza vaccine: 01/29/2020, due in fall Pneumococcal vaccine: Declines Tdap vaccine: Due, call office to schedule as per our conversation today. Shingles vaccine: Shingrix discussed. Please contact your pharmacy for coverage information.     Covid-19: Declines  Advanced directives: Advance directive discussed with you today. Even though you declined this today, please call our office should you change your mind, and we can give you the proper paperwork for you to fill out.   Conditions/risks identified: Aim for 30 minutes of exercise or brisk walking each day, drink 6-8 glasses of water and eat lots of fruits and vegetables.   Next appointment: Follow up in one year for your annual wellness visit   Preventive Care 40-64 Years, Male Preventive care refers to lifestyle choices and visits with your health care provider that can promote health and wellness. What does preventive care include? A yearly physical exam. This is also called an annual well check. Dental exams once or twice a year. Routine eye exams. Ask your health care provider how often you should have your eyes checked. Personal lifestyle choices, including: Daily care of your teeth and gums. Regular physical activity. Eating a healthy diet. Avoiding tobacco and drug use. Limiting alcohol use. Practicing safe sex. Taking low-dose aspirin every day starting at age 29. What happens during an annual well check? The services and screenings done by your  health care provider during your annual well check will depend on your age, overall health, lifestyle risk factors, and family history of disease. Counseling  Your health care provider may ask you questions about your: Alcohol use. Tobacco use. Drug use. Emotional well-being. Home and relationship well-being. Sexual activity. Eating habits. Work and work Statistician. Screening  You may have the following tests or measurements: Height, weight, and BMI. Blood pressure. Lipid and cholesterol levels. These may be checked every 5 years, or more frequently if you are over 57 years old. Skin check. Lung cancer screening. You may have this screening every year starting at age 95 if you have a 30-pack-year history of smoking and currently smoke or have quit within the past 15 years. Fecal occult blood test (FOBT) of the stool. You may have this test every year starting at age 19. Flexible sigmoidoscopy or colonoscopy. You may have a sigmoidoscopy every 5 years or a colonoscopy every 10 years starting at age 49. Prostate cancer screening. Recommendations will vary depending on your family history and other risks. Hepatitis C blood test. Hepatitis B blood test. Sexually transmitted disease (STD) testing. Diabetes screening. This is done by checking your blood sugar (glucose) after you have not eaten for a while (fasting). You may have this done every 1-3 years. Discuss your test results, treatment options, and if necessary, the need for more tests with your health care provider. Vaccines  Your health care provider may recommend certain vaccines, such as: Influenza vaccine. This is recommended every year. Tetanus, diphtheria, and acellular pertussis (Tdap, Td) vaccine. You may need a Td booster every 10 years. Zoster vaccine. You may need this after age 64. Pneumococcal 13-valent conjugate (PCV13)  vaccine. You may need this if you have certain conditions and have not been vaccinated. Pneumococcal  polysaccharide (PPSV23) vaccine. You may need one or two doses if you smoke cigarettes or if you have certain conditions. Talk to your health care provider about which screenings and vaccines you need and how often you need them. This information is not intended to replace advice given to you by your health care provider. Make sure you discuss any questions you have with your health care provider. Document Released: 06/12/2015 Document Revised: 02/03/2016 Document Reviewed: 03/17/2015 Elsevier Interactive Patient Education  2017 [employer] Prevention in the Home Falls can cause injuries. They can happen to people of all ages. There are many things you can do to make your home safe and to help prevent falls. What can I do on the outside of my home? Regularly fix the edges of walkways and driveways and fix any cracks. Remove anything that might make you trip as you walk through a door, such as a raised step or threshold. Trim any bushes or trees on the path to your home. Use bright outdoor lighting. Clear any walking paths of anything that might make someone trip, such as rocks or tools. Regularly check to see if handrails are loose or broken. Make sure that both sides of any steps have handrails. Any raised decks and porches should have guardrails on the edges. Have any leaves, snow, or ice cleared regularly. Use sand or salt on walking paths during winter. Clean up any spills in your garage right away. This includes oil or grease spills. What can I do in the bathroom? Use night lights. Install grab bars by the toilet and in the tub and shower. Do not use towel bars as grab bars. Use non-skid mats or decals in the tub or shower. If you need to sit down in the shower, use a plastic, non-slip stool. Keep the floor dry. Clean up any water that spills on the floor as soon as it happens. Remove soap buildup in the tub or shower regularly. Attach bath mats securely with double-sided  non-slip rug tape. Do not have throw rugs and other things on the floor that can make you trip. What can I do in the bedroom? Use night lights. Make sure that you have a light by your bed that is easy to reach. Do not use any sheets or blankets that are too big for your bed. They should not hang down onto the floor. Have a firm chair that has side arms. You can use this for support while you get dressed. Do not have throw rugs and other things on the floor that can make you trip. What can I do in the kitchen? Clean up any spills right away. Avoid walking on wet floors. Keep items that you use a lot in easy-to-reach places. If you need to reach something above you, use a strong step stool that has a grab bar. Keep electrical cords out of the way. Do not use floor polish or wax that makes floors slippery. If you must use wax, use non-skid floor wax. Do not have throw rugs and other things on the floor that can make you trip. What can I do with my stairs? Do not leave any items on the stairs. Make sure that there are handrails on both sides of the stairs and use them. Fix handrails that are broken or loose. Make sure that handrails are as long as the stairways. Check any carpeting  to make sure that it is firmly attached to the stairs. Fix any carpet that is loose or worn. Avoid having throw rugs at the top or bottom of the stairs. If you do have throw rugs, attach them to the floor with carpet tape. Make sure that you have a light switch at the top of the stairs and the bottom of the stairs. If you do not have them, ask someone to add them for you. What else can I do to help prevent falls? Wear shoes that: Do not have high heels. Have rubber bottoms. Are comfortable and fit you well. Are closed at the toe. Do not wear sandals. If you use a stepladder: Make sure that it is fully opened. Do not climb a closed stepladder. Make sure that both sides of the stepladder are locked into place. Ask  someone to hold it for you, if possible. Clearly mark and make sure that you can see: Any grab bars or handrails. First and last steps. Where the edge of each step is. Use tools that help you move around (mobility aids) if they are needed. These include: Canes. Walkers. Scooters. Crutches. Turn on the lights when you go into a dark area. Replace any light bulbs as soon as they burn out. Set up your furniture so you have a clear path. Avoid moving your furniture around. If any of your floors are uneven, fix them. If there are any pets around you, be aware of where they are. Review your medicines with your doctor. Some medicines can make you feel dizzy. This can increase your chance of falling. Ask your doctor what other things that you can do to help prevent falls. This information is not intended to replace advice given to you by your health care provider. Make sure you discuss any questions you have with your health care provider. Document Released: 03/12/2009 Document Revised: 10/22/2015 Document Reviewed: 06/20/2014 Elsevier Interactive Patient Education  2017 [employer].

## 2021-02-03 NOTE — Progress Notes (Signed)
Subjective:   Matthew Pennington is a 55 y.o. male who presents for an Initial Medicare Annual Wellness Visit. Virtual Visit via Telephone Note  I connected with  Matthew Pennington on 02/03/21 at  4:15 PM EDT by telephone and verified that I am speaking with the correct person using two identifiers.  Location: Patient: HOME Provider: [facility] Persons participating in the virtual visit: patient/Nurse Health Advisor   I discussed the limitations, risks, security and privacy concerns of performing an evaluation and management service by telephone and the availability of in person appointments. The patient expressed understanding and agreed to proceed.  Interactive audio and video telecommunications were attempted between this nurse and patient, however failed, due to patient having technical difficulties OR patient did not have access to video capability.  We continued and completed visit with audio only.  Some vital signs may be absent or patient reported.   Chriss Driver, LPN  Review of Systems     Cardiac Risk Factors include: advanced age (>71mn, >>10women);diabetes mellitus;dyslipidemia;hypertension;obesity (BMI >30kg/m2);sedentary lifestyle;male gender     Objective:    Today's Vitals   02/03/21 1601  Weight: 289 lb (131.1 kg)  Height: 6' 1"  (1.854 m)   Body mass index is 38.13 kg/m.  Advanced Directives 02/03/2021 07/07/2018  Does Patient Have a Medical Advance Directive? No No  Would patient like information on creating a medical advance directive? No - Patient declined No - Patient declined    Current Medications (verified) Outpatient Encounter Medications as of 02/03/2021  Medication Sig   Blood Glucose Calibration (ONETOUCH VERIO) High SOLN    Blood Glucose Monitoring Suppl (ONETOUCH VERIO REFLECT) w/Device KIT    glucose blood (ONETOUCH VERIO) test strip    empagliflozin (JARDIANCE) 25 MG TABS tablet Take 1 tablet (25 mg total) by mouth daily.   enalapril (VASOTEC) 20 MG  tablet Take 1 tablet (20 mg total) by mouth daily.   ezetimibe (ZETIA) 10 MG tablet Take 1 tablet (10 mg total) by mouth daily.   gabapentin (NEURONTIN) 300 MG capsule Take 1 capsule (300 mg total) by mouth 3 (three) times daily.   ibuprofen (ADVIL) 800 MG tablet Take 1 tablet (800 mg total) by mouth daily.   metFORMIN (GLUCOPHAGE) 500 MG tablet TAKE 2 TABLETS BY MOUTH ONCE DAILY WITH BREAKFAST AND 1 TABLET IN THE EVENING   oxcarbazepine (TRILEPTAL) 600 MG tablet Take 1 tablet (600 mg total) by mouth 3 (three) times daily.   oxyCODONE-acetaminophen (PERCOCET) 10-325 MG tablet Take 1 tablet by mouth every 6 (six) hours as needed for pain.   pantoprazole (PROTONIX) 40 MG tablet Take 1 tablet (40 mg total) by mouth daily.   rOPINIRole (REQUIP) 0.25 MG tablet TAKE 1 TO 2 TABLETS BY MOUTH AT BEDTIME FOR  RESTLESS  LEGS   traMADol (ULTRAM) 50 MG tablet Take 1 tablet (50 mg total) by mouth daily.   [DISCONTINUED] naproxen sodium (ANAPROX DS) 550 MG tablet Take 1 tablet (550 mg total) by mouth 2 (two) times daily with a meal.   No facility-administered encounter medications on file as of 02/03/2021.    Allergies (verified) Morphine and related and Statins   History: Past Medical History:  Diagnosis Date   [place] mellitus [place]    H/O echocardiogram 09/2017   normal EF   Headache    Hyperlipidemia    Hypertension    Light headedness    OSA on CPAP    Palpitations    Tingling  toes   Trigeminal neuralgia    Past Surgical History:  Procedure Laterality Date   NO PAST SURGERIES     Family History  Problem Relation Age of Onset   Diabetes Mother    Hyperlipidemia Mother    Hypertension Mother    Lung cancer Father    Heart disease Sister    Social History   Socioeconomic History   Marital status: Married    Spouse name: Matthew Pennington   Number of children: 3   Years of education: Not on file   Highest education level: Associate degree: academic program   Occupational History   Not on file  Tobacco Use   Smoking status: Never   Smokeless tobacco: Never  Vaping Use   Vaping Use: Never used  Substance and Sexual Activity   Alcohol use: No   Drug use: No   Sexual activity: Not on file  Other Topics Concern   Not on file  Social History Narrative   Patient is retired Quarry manager. Lives with wife   Caffeine coffee 1 cup   3 sons, live near by.   Social Determinants of Health   Financial Resource Strain: Low Risk    Difficulty of Paying Living Expenses: Not hard at all  Food Insecurity: No Food Insecurity   Worried About Charity fundraiser in the Last Year: Never true   Arboriculturist in the Last Year: Never true  Transportation Needs: Unmet Transportation Needs   Lack of Transportation (Medical): Yes   Lack of Transportation (Non-Medical): Yes  Physical Activity: Insufficiently Active   Days of Exercise per Week: 2 days   Minutes of Exercise per Session: 10 min  Stress: No Stress Concern Present   Feeling of Stress : Only a little  Social Connections: Engineer, building services of Communication with Friends and Family: More than three times a week   Frequency of Social Gatherings with Friends and Family: More than three times a week   Attends Religious Services: More than 4 times per year   Active Member of [employer] or Organizations: Yes   Attends Music therapist: More than 4 times per year   Marital Status: Married    Tobacco Counseling Counseling given: Not Answered   Clinical Intake:  Pre-visit preparation completed: Yes  Pain : No/denies pain     BMI - recorded: 38.13 Nutritional Status: BMI > 30  Obese Nutritional Risks: None Diabetes: Yes  How often do you need to have someone help you when you read instructions, pamphlets, or other written materials from your doctor or pharmacy?: 1 - Never  Diabetic?Nutrition Risk Assessment:  Has the patient had any N/V/D within the last 2  months?  No  Does the patient have any non-healing wounds?  No  Has the patient had any unintentional weight loss or weight gain?  No   Diabetes:  Is the patient diabetic?  Yes   If diabetic, was a CBG obtained today?  Yes  Did the patient bring in their glucometer from home?  No  How often do you monitor your CBG's? 1-2 times per day per pt..   Financial Strains and Diabetes Management:  Are you having any financial strains with the device, your supplies or your medication? Yes  Pt's Jardiance but is using samples and coupons per pt, so he is able to afford at this time.  Does the patient want to be seen by Chronic Care Management for management of their diabetes?  No  Would the patient like to be referred to a Nutritionist or for Diabetic Management?  No   Diabetic Exams:  Diabetic Eye Exam: Completed 1/312022. Pt has been advised about the importance in completing this exam.   Diabetic Foot Exam: Completed 08/27/2020. Pt has been advised about the importance in completing this exam. Pt is scheduled for diabetic foot exam 2023    Interpreter Needed?: No  Information entered by :: Randal Buba, LPN   Activities of Daily Living In your present state of health, do you have any difficulty performing the following activities: 02/03/2021  Hearing? N  Vision? N  Difficulty concentrating or making decisions? Y  Comment Pt states he does have issue remebering and misplacing item.  Walking or climbing stairs? Y  Comment Due to back and leg pain. Uses a cane.  Dressing or bathing? N  Doing errands, shopping? N  Preparing Food and eating ? N  Using the Toilet? N  In the past six months, have you accidently leaked urine? N  Do you have problems with loss of bowel control? N  Managing your Medications? N  Managing your Finances? N  Housekeeping or managing your Housekeeping? N  Some recent data might be hidden    Patient Care Team: Sharion Balloon, FNP as PCP - General  (Family Medicine) Herminio Commons, MD (Inactive) as PCP - Cardiology (Cardiology) Eloise Harman, DO as Consulting Physician (Internal Medicine)  Indicate any recent Medical Services you may have received from other than [facility] providers in the past year (date may be approximate).     Assessment:   This is a routine wellness examination for [place].  Hearing/Vision screen Hearing Screening - Comments:: Pt c/o some hearing loss.  Vision Screening - Comments:: Glasses. Up to date on eye exam. Dr. Felix Ahmadi in [place].  Dietary issues and exercise activities discussed: Current Exercise Habits: Home exercise routine, Type of exercise: walking, Time (Minutes): 10, Frequency (Times/Week): 3, Weekly Exercise (Minutes/Week): 30, Intensity: Mild, Exercise limited by: cardiac condition(s);orthopedic condition(s)   Goals Addressed             This Visit's Progress    Have 3 meals a day       Pt would like to drink more water and watch carb intake.      Depression Screen PHQ 2/9 Scores 02/03/2021 01/08/2021 08/27/2020 04/09/2020 12/09/2019 10/30/2019 10/21/2019  PHQ - 2 Score 2 3 2 6 2  0 0  PHQ- 9 Score 2 12 8 14 7  - 16    Fall Risk Fall Risk  08/27/2020 04/09/2020 12/09/2019 10/30/2019 10/21/2019  Falls in the past year? 0 0 1 1 0  Number falls in past yr: - - 0 0 -  Injury with Fall? - - 1 1 -  Risk for fall due to : - - Impaired balance/gait Impaired balance/gait -  Follow up - - Education provided Education provided -    FALL RISK PREVENTION PERTAINING TO THE HOME:  Any stairs in or around the home? Yes  If so, are there any without handrails? Yes  Home free of loose throw rugs in walkways, pet beds, electrical cords, etc? Yes  Adequate lighting in your home to reduce risk of falls? Yes   ASSISTIVE DEVICES UTILIZED TO PREVENT FALLS:  Life alert? No  Use of a cane, walker or w/c? Yes  Grab bars in the bathroom? No  Shower chair or bench in shower? Yes  Elevated toilet seat  or a  handicapped toilet? Yes   TIMED UP AND GO:  Was the test performed? No . Phone visit.   Cognitive Function:     6CIT Screen 02/03/2021  What Year? 0 points  What month? 0 points  What time? 0 points  Count back from 20 0 points  Months in reverse 0 points  Repeat phrase 0 points  Total Score 0    Immunizations Immunization History  Administered Date(s) Administered   Influenza Split 02/13/2017   Influenza,inj,quad, With Preservative 02/13/2017, 02/20/2018, 02/05/2019   Influenza-Unspecified 01/29/2020    TDAP status: Due, Education has been provided regarding the importance of this vaccine. Advised may receive this vaccine at local pharmacy or Health Dept. Aware to provide a copy of the vaccination record if obtained from local pharmacy or Health Dept. Verbalized acceptance and understanding.  Flu Vaccine status: Due, Education has been provided regarding the importance of this vaccine. Advised may receive this vaccine at local pharmacy or Health Dept. Aware to provide a copy of the vaccination record if obtained from local pharmacy or Health Dept. Verbalized acceptance and understanding.  Pneumococcal vaccine status: Due, Education has been provided regarding the importance of this vaccine. Advised may receive this vaccine at local pharmacy or Health Dept. Aware to provide a copy of the vaccination record if obtained from local pharmacy or Health Dept. Verbalized acceptance and understanding.  Covid-19 vaccine status: Declined, Education has been provided regarding the importance of this vaccine but patient still declined. Advised may receive this vaccine at local pharmacy or Health Dept.or vaccine clinic. Aware to provide a copy of the vaccination record if obtained from local pharmacy or Health Dept. Verbalized acceptance and understanding.  Qualifies for Shingles Vaccine? Yes   Zostavax completed No   Shingrix Completed?: No.    Education has been provided regarding the  importance of this vaccine. Patient has been advised to call insurance company to determine out of pocket expense if they have not yet received this vaccine. Advised may also receive vaccine at local pharmacy or Health Dept. Verbalized acceptance and understanding.  Screening Tests Health Maintenance  Topic Date Due   COVID-19 Vaccine (1) Never done   Zoster Vaccines- Shingrix (1 of 2) Never done   INFLUENZA VACCINE  08/27/2021 (Originally 12/28/2020)   PNEUMOCOCCAL POLYSACCHARIDE VACCINE AGE 61-64 HIGH RISK  08/27/2021 (Originally [birth year]1967)   TETANUS/TDAP  08/27/2021 (Originally 07/01/1984)   Pneumococcal Vaccine 39-66 Years old (1 - PCV) 01/08/2022 (Originally [birth year]02/1971)   HEMOGLOBIN A1C  02/26/2021   OPHTHALMOLOGY EXAM  06/29/2021   FOOT EXAM  08/27/2021   COLONOSCOPY (Pts 45-79yr Insurance coverage will need to be confirmed)  01/28/2030   Hepatitis C Screening  Completed   HIV Screening  Completed   HPV VACCINES  Aged Out    Health Maintenance  Health Maintenance Due  Topic Date Due   COVID-19 Vaccine (1) Never done   Zoster Vaccines- Shingrix (1 of 2) Never done    Colorectal cancer screening: Type of screening: Colonoscopy. Completed 01/29/2020. Repeat every 10 years  Lung Cancer Screening: (Low Dose CT Chest recommended if Age 55-80years, 30 pack-year currently smoking OR have quit w/in 15years.) does not qualify.    Additional Screening:  Hepatitis C Screening: does not qualify; Completed 04/09/2020  Vision Screening: Recommended annual ophthalmology exams for early detection of glaucoma and other disorders of the eye. Is the patient up to date with their annual eye exam?  Yes  Who is the provider or what is the name  of the office in which the patient attends annual eye exams? Dr. Flossie Buffy in [place] If pt is not established with a provider, would they like to be referred to a provider to establish care? No .   Dental Screening: Recommended annual dental  exams for proper oral hygiene  Community Resource Referral / Chronic Care Management: CRR required this visit?  No   CCM required this visit?  No      Plan:     I have personally reviewed and noted the following in the patient's chart:   Medical and social history Use of alcohol, tobacco or illicit drugs  Current medications and supplements including opioid prescriptions. Patient is currently taking opioid prescriptions. Information provided to patient regarding non-opioid alternatives. Patient advised to discuss non-opioid treatment plan with their provider. Functional ability and status Nutritional status Physical activity Advanced directives List of other physicians Hospitalizations, surgeries, and ER visits in previous 12 months Vitals Screenings to include cognitive, depression, and falls Referrals and appointments  In addition, I have reviewed and discussed with patient certain preventive protocols, quality metrics, and best practice recommendations. A written personalized care plan for preventive services as well as general preventive health recommendations were provided to patient.     Chriss Driver, LPN   11/01/7901   Nurse Notes: Spoke with patient regarding counseling services in relation to his episodes of depression. Advised pt that services are available if he is interested. Pt declines at this time. Also discussed with patient in regards to CCM/Nutritionist referral but pt declines at this time. Encouraged pt to get Tdap at next visit. Pt declines all other vaccines, for now.

## 2021-02-05 ENCOUNTER — Other Ambulatory Visit: Payer: Self-pay | Admitting: Family

## 2021-02-05 DIAGNOSIS — E119 Type 2 diabetes mellitus without complications: Secondary | ICD-10-CM

## 2021-02-22 DIAGNOSIS — G4733 Obstructive sleep apnea (adult) (pediatric): Secondary | ICD-10-CM | POA: Diagnosis not present

## 2021-02-25 DIAGNOSIS — M9902 Segmental and somatic dysfunction of thoracic region: Secondary | ICD-10-CM | POA: Diagnosis not present

## 2021-02-25 DIAGNOSIS — M9901 Segmental and somatic dysfunction of cervical region: Secondary | ICD-10-CM | POA: Diagnosis not present

## 2021-02-25 DIAGNOSIS — M47816 Spondylosis without myelopathy or radiculopathy, lumbar region: Secondary | ICD-10-CM | POA: Diagnosis not present

## 2021-02-25 DIAGNOSIS — S29012A Strain of muscle and tendon of back wall of thorax, initial encounter: Secondary | ICD-10-CM | POA: Diagnosis not present

## 2021-02-25 DIAGNOSIS — M47812 Spondylosis without myelopathy or radiculopathy, cervical region: Secondary | ICD-10-CM | POA: Diagnosis not present

## 2021-02-25 DIAGNOSIS — M9903 Segmental and somatic dysfunction of lumbar region: Secondary | ICD-10-CM | POA: Diagnosis not present

## 2021-02-25 DIAGNOSIS — M542 Cervicalgia: Secondary | ICD-10-CM | POA: Diagnosis not present

## 2021-02-25 DIAGNOSIS — S338XXA Sprain of other parts of lumbar spine and pelvis, initial encounter: Secondary | ICD-10-CM | POA: Diagnosis not present

## 2021-03-04 DIAGNOSIS — M47816 Spondylosis without myelopathy or radiculopathy, lumbar region: Secondary | ICD-10-CM | POA: Diagnosis not present

## 2021-03-04 DIAGNOSIS — S338XXA Sprain of other parts of lumbar spine and pelvis, initial encounter: Secondary | ICD-10-CM | POA: Diagnosis not present

## 2021-03-04 DIAGNOSIS — M9902 Segmental and somatic dysfunction of thoracic region: Secondary | ICD-10-CM | POA: Diagnosis not present

## 2021-03-04 DIAGNOSIS — S29012A Strain of muscle and tendon of back wall of thorax, initial encounter: Secondary | ICD-10-CM | POA: Diagnosis not present

## 2021-03-04 DIAGNOSIS — M9901 Segmental and somatic dysfunction of cervical region: Secondary | ICD-10-CM | POA: Diagnosis not present

## 2021-03-04 DIAGNOSIS — M542 Cervicalgia: Secondary | ICD-10-CM | POA: Diagnosis not present

## 2021-03-04 DIAGNOSIS — M47812 Spondylosis without myelopathy or radiculopathy, cervical region: Secondary | ICD-10-CM | POA: Diagnosis not present

## 2021-03-04 DIAGNOSIS — M9903 Segmental and somatic dysfunction of lumbar region: Secondary | ICD-10-CM | POA: Diagnosis not present

## 2021-04-06 DIAGNOSIS — S338XXA Sprain of other parts of lumbar spine and pelvis, initial encounter: Secondary | ICD-10-CM | POA: Diagnosis not present

## 2021-04-06 DIAGNOSIS — M47816 Spondylosis without myelopathy or radiculopathy, lumbar region: Secondary | ICD-10-CM | POA: Diagnosis not present

## 2021-04-06 DIAGNOSIS — S29012A Strain of muscle and tendon of back wall of thorax, initial encounter: Secondary | ICD-10-CM | POA: Diagnosis not present

## 2021-04-06 DIAGNOSIS — M9901 Segmental and somatic dysfunction of cervical region: Secondary | ICD-10-CM | POA: Diagnosis not present

## 2021-04-06 DIAGNOSIS — M9903 Segmental and somatic dysfunction of lumbar region: Secondary | ICD-10-CM | POA: Diagnosis not present

## 2021-04-06 DIAGNOSIS — M47812 Spondylosis without myelopathy or radiculopathy, cervical region: Secondary | ICD-10-CM | POA: Diagnosis not present

## 2021-04-06 DIAGNOSIS — M542 Cervicalgia: Secondary | ICD-10-CM | POA: Diagnosis not present

## 2021-04-06 DIAGNOSIS — M9902 Segmental and somatic dysfunction of thoracic region: Secondary | ICD-10-CM | POA: Diagnosis not present

## 2021-04-16 DIAGNOSIS — S29012A Strain of muscle and tendon of back wall of thorax, initial encounter: Secondary | ICD-10-CM | POA: Diagnosis not present

## 2021-04-16 DIAGNOSIS — M542 Cervicalgia: Secondary | ICD-10-CM | POA: Diagnosis not present

## 2021-04-16 DIAGNOSIS — M9902 Segmental and somatic dysfunction of thoracic region: Secondary | ICD-10-CM | POA: Diagnosis not present

## 2021-04-16 DIAGNOSIS — S338XXA Sprain of other parts of lumbar spine and pelvis, initial encounter: Secondary | ICD-10-CM | POA: Diagnosis not present

## 2021-04-16 DIAGNOSIS — M9903 Segmental and somatic dysfunction of lumbar region: Secondary | ICD-10-CM | POA: Diagnosis not present

## 2021-04-16 DIAGNOSIS — M9901 Segmental and somatic dysfunction of cervical region: Secondary | ICD-10-CM | POA: Diagnosis not present

## 2021-04-16 DIAGNOSIS — M47816 Spondylosis without myelopathy or radiculopathy, lumbar region: Secondary | ICD-10-CM | POA: Diagnosis not present

## 2021-04-16 DIAGNOSIS — M47812 Spondylosis without myelopathy or radiculopathy, cervical region: Secondary | ICD-10-CM | POA: Diagnosis not present

## 2021-04-27 ENCOUNTER — Telehealth: Payer: Self-pay | Admitting: Family

## 2021-04-27 NOTE — Telephone Encounter (Signed)
Samples up front 

## 2021-05-18 DIAGNOSIS — M9901 Segmental and somatic dysfunction of cervical region: Secondary | ICD-10-CM | POA: Diagnosis not present

## 2021-05-18 DIAGNOSIS — S29012A Strain of muscle and tendon of back wall of thorax, initial encounter: Secondary | ICD-10-CM | POA: Diagnosis not present

## 2021-05-18 DIAGNOSIS — S338XXA Sprain of other parts of lumbar spine and pelvis, initial encounter: Secondary | ICD-10-CM | POA: Diagnosis not present

## 2021-05-18 DIAGNOSIS — M47816 Spondylosis without myelopathy or radiculopathy, lumbar region: Secondary | ICD-10-CM | POA: Diagnosis not present

## 2021-05-18 DIAGNOSIS — M9903 Segmental and somatic dysfunction of lumbar region: Secondary | ICD-10-CM | POA: Diagnosis not present

## 2021-05-18 DIAGNOSIS — M47812 Spondylosis without myelopathy or radiculopathy, cervical region: Secondary | ICD-10-CM | POA: Diagnosis not present

## 2021-05-18 DIAGNOSIS — M542 Cervicalgia: Secondary | ICD-10-CM | POA: Diagnosis not present

## 2021-05-18 DIAGNOSIS — M9902 Segmental and somatic dysfunction of thoracic region: Secondary | ICD-10-CM | POA: Diagnosis not present

## 2021-05-26 ENCOUNTER — Other Ambulatory Visit: Payer: Self-pay | Admitting: Family

## 2021-05-26 DIAGNOSIS — E119 Type 2 diabetes mellitus without complications: Secondary | ICD-10-CM

## 2021-05-26 MED ORDER — METFORMIN HCL 500 MG PO TABS: ORAL_TABLET | ORAL | 0 refills | Status: DC

## 2021-05-26 NOTE — Addendum Note (Signed)
Addended by: Antonietta Barcelona D on: 05/26/2021 04:28 PM   Modules accepted: Orders

## 2021-05-26 NOTE — Telephone Encounter (Signed)
[facility]. NTBS for 3 mos ckup. Mail order not sent

## 2021-05-26 NOTE — Telephone Encounter (Signed)
I made pt an appt on 06-01-2021 @ 2:10pm w/[facility] & put him on wait list, unless Lenna Gilford has a cancellation appt before then. I told pt in the future make sure he stops at check-out to make f/u appt's before he leaves & he can call back to change it, if he needs to.

## 2021-06-01 ENCOUNTER — Ambulatory Visit (INDEPENDENT_AMBULATORY_CARE_PROVIDER_SITE_OTHER): Payer: Medicare Other | Admitting: Family

## 2021-06-01 ENCOUNTER — Encounter: Payer: Self-pay | Admitting: Family

## 2021-06-01 VITALS — BP 126/78 | HR 88 | Temp 96.9°F | Wt 290.4 lb

## 2021-06-01 DIAGNOSIS — E119 Type 2 diabetes mellitus without complications: Secondary | ICD-10-CM | POA: Diagnosis not present

## 2021-06-01 DIAGNOSIS — E1159 Type 2 diabetes mellitus with other circulatory complications: Secondary | ICD-10-CM | POA: Diagnosis not present

## 2021-06-01 DIAGNOSIS — G8929 Other chronic pain: Secondary | ICD-10-CM

## 2021-06-01 DIAGNOSIS — G4733 Obstructive sleep apnea (adult) (pediatric): Secondary | ICD-10-CM

## 2021-06-01 DIAGNOSIS — G500 Trigeminal neuralgia: Secondary | ICD-10-CM

## 2021-06-01 DIAGNOSIS — M545 Low back pain, unspecified: Secondary | ICD-10-CM | POA: Diagnosis not present

## 2021-06-01 DIAGNOSIS — F411 Generalized anxiety disorder: Secondary | ICD-10-CM

## 2021-06-01 DIAGNOSIS — R29898 Other symptoms and signs involving the musculoskeletal system: Secondary | ICD-10-CM | POA: Diagnosis not present

## 2021-06-01 DIAGNOSIS — K219 Gastro-esophageal reflux disease without esophagitis: Secondary | ICD-10-CM | POA: Diagnosis not present

## 2021-06-01 DIAGNOSIS — E785 Hyperlipidemia, unspecified: Secondary | ICD-10-CM

## 2021-06-01 DIAGNOSIS — Z79899 Other long term (current) drug therapy: Secondary | ICD-10-CM

## 2021-06-01 DIAGNOSIS — I152 Hypertension secondary to endocrine disorders: Secondary | ICD-10-CM

## 2021-06-01 DIAGNOSIS — E1169 Type 2 diabetes mellitus with other specified complication: Secondary | ICD-10-CM

## 2021-06-01 DIAGNOSIS — F331 Major depressive disorder, recurrent, moderate: Secondary | ICD-10-CM

## 2021-06-01 LAB — BAYER DCA HB A1C WAIVED: HB A1C (BAYER DCA - WAIVED): 7.1 % — ABNORMAL HIGH (ref 4.8–5.6)

## 2021-06-01 MED ORDER — METFORMIN HCL 500 MG PO TABS: ORAL_TABLET | ORAL | 0 refills | Status: DC

## 2021-06-01 MED ORDER — GABAPENTIN 300 MG PO CAPS: 300.0000 mg | ORAL_CAPSULE | Freq: Three times a day (TID) | ORAL | 1 refills | Status: DC

## 2021-06-01 MED ORDER — PANTOPRAZOLE SODIUM 40 MG PO TBEC: 40.0000 mg | DELAYED_RELEASE_TABLET | Freq: Every day | ORAL | 1 refills | Status: DC

## 2021-06-01 MED ORDER — TRAMADOL HCL 50 MG PO TABS: 50.0000 mg | ORAL_TABLET | Freq: Every day | ORAL | 2 refills | Status: DC

## 2021-06-01 MED ORDER — EMPAGLIFLOZIN 25 MG PO TABS: 25.0000 mg | ORAL_TABLET | Freq: Every day | ORAL | 1 refills | Status: DC

## 2021-06-01 MED ORDER — ENALAPRIL MALEATE 20 MG PO TABS: 20.0000 mg | ORAL_TABLET | Freq: Every day | ORAL | 1 refills | Status: DC

## 2021-06-01 NOTE — Patient Instructions (Signed)
Hand Pain Many things can cause hand pain. Some common causes are: An injury. Repeating the same movement with your hand over and over (overuse). Osteoporosis. Arthritis. Lumps in the tendons or joints of the hand and wrist (ganglion cysts). Nerve compression syndromes (carpal tunnel syndrome). Inflammation of the tendons (tendinitis). Infection. Follow these instructions at home: Pay attention to any changes in your symptoms. Take these actions to help with your discomfort: Managing pain, stiffness, and swelling  Take over-the-counter and prescription medicines only as told by your health care provider. Wear a hand splint or support as told by your health care provider. If directed, put ice on the affected area: Put ice in a plastic bag. Place a towel between your skin and the bag. Leave the ice on for 20 minutes, 2-3 times a day. Activity Take breaks from repetitive activity often. Avoid activities that make your pain worse. Minimize stress on your hands and wrists as much as possible. Do stretches or exercises as told by your health care provider. Do not do activities that make your pain worse. Contact a health care provider if: Your pain does not get better after a few days of self-care. Your pain gets worse. Your pain affects your ability to do your daily activities. Get help right away if: Your hand becomes warm, red, or swollen. Your hand is numb or tingling. Your hand is extremely swollen or deformed. Your hand or fingers turn white or blue. You cannot move your hand, wrist, or fingers. Summary Many things can cause hand pain. Contact your health care provider if your pain does not get better after a few days of self care. Minimize stress on your hands and wrists as much as possible. Do not do activities that make your pain worse. This information is not intended to replace advice given to you by your health care provider. Make sure you discuss any questions you have  with your health care provider. Document Revised: 09/03/2020 Document Reviewed: 09/03/2020 Elsevier Patient Education  [place].

## 2021-06-01 NOTE — Progress Notes (Signed)
Subjective:    Patient ID: Matthew Pennington, male    DOB: 12-23-1965, 56 y.o.   MRN: [id number]  Chief Complaint  Patient presents with   Medical Management of Chronic Issues   Numbness    Both feet     Hand Pain    Losing grip    Pt presents to the office today for chronic follow up. He is followed by Neurologists for Trigeminal neuralgia. She has given him oxycodone as needed for flare ups. He only takes it 3-4 times a year.     He does take Ultram as needed for chronic back pain. He only takes this 1-2 times a month. He never mixes the two.    He is currently going to chiropractor once a week for back and shoulder pain.    He has OSA and uses CPAP nightly.   Hand Pain  The incident occurred more than 1 week ago. There was no injury mechanism. The pain is present in the right hand. The quality of the pain is described as aching. The pain is at a severity of 4/10. The pain is moderate. The pain has been Worsening since the incident. Associated symptoms include muscle weakness. Pertinent negatives include no numbness or tingling. He has tried rest and NSAIDs for the symptoms. The treatment provided mild relief.  Hypertension This is a chronic problem. The current episode started more than 1 year ago. The problem has been resolved since onset. The problem is controlled. Associated symptoms include anxiety. Pertinent negatives include no blurred vision, malaise/fatigue, peripheral edema or shortness of breath. Risk factors for coronary artery disease include dyslipidemia, diabetes mellitus, obesity and male gender. The current treatment provides moderate improvement.  Gastroesophageal Reflux He complains of belching and heartburn. This is a chronic problem. The current episode started more than 1 year ago. The problem occurs occasionally. He has tried a PPI for the symptoms. The treatment provided moderate relief.  Diabetes He presents for his follow-up diabetic visit. He has type 2 diabetes  mellitus. Hypoglycemia symptoms include nervousness/anxiousness. Associated symptoms include foot paresthesias. Pertinent negatives for diabetes include no blurred vision. Symptoms are stable. Risk factors for coronary artery disease include dyslipidemia, diabetes mellitus, male sex, hypertension and sedentary lifestyle. He is following a generally healthy diet. His overall blood glucose range is 130-140 mg/dl. Eye exam is current.  Hyperlipidemia This is a chronic problem. The current episode started more than 1 year ago. Exacerbating diseases include obesity. Pertinent negatives include no shortness of breath. Current antihyperlipidemic treatment includes statins. The current treatment provides moderate improvement of lipids.  Anxiety Presents for follow-up visit. Symptoms include depressed mood, excessive worry, irritability, nervous/anxious behavior and restlessness. Patient reports no shortness of breath. Symptoms occur most days. The severity of symptoms is moderate.    Depression        This is a chronic problem.  The current episode started more than 1 year ago.   The problem occurs intermittently.  Associated symptoms include helplessness, hopelessness, irritable, restlessness and sad.  Past medical history includes anxiety.      Review of Systems  Constitutional:  Positive for irritability. Negative for malaise/fatigue.  Eyes:  Negative for blurred vision.  Respiratory:  Negative for shortness of breath.   Gastrointestinal:  Positive for heartburn.  Neurological:  Negative for tingling and numbness.  Psychiatric/Behavioral:  Positive for depression. The patient is nervous/anxious.   All other systems reviewed and are negative.     Objective:   Physical Exam Vitals  reviewed.  Constitutional:      General: He is irritable. He is not in acute distress.    Appearance: He is well-developed. He is obese.  HENT:     Head: Normocephalic.     Right Ear: Tympanic membrane normal.      Left Ear: Tympanic membrane normal.  Eyes:     General:        Right eye: No discharge.        Left eye: No discharge.     Pupils: Pupils are equal, round, and reactive to light.  Neck:     Thyroid: No thyromegaly.  Cardiovascular:     Rate and Rhythm: Normal rate and regular rhythm.     Heart sounds: Normal heart sounds. No murmur heard. Pulmonary:     Effort: Pulmonary effort is normal. No respiratory distress.     Breath sounds: Normal breath sounds. No wheezing.  Abdominal:     General: Bowel sounds are normal. There is no distension.     Palpations: Abdomen is soft.     Tenderness: There is no abdominal tenderness.  Musculoskeletal:        General: No tenderness. Normal range of motion.     Cervical back: Normal range of motion and neck supple.     Comments: Right hand weakness  Skin:    General: Skin is warm and dry.     Findings: No erythema or rash.  Neurological:     Mental Status: He is alert and oriented to person, place, and time.     Cranial Nerves: No cranial nerve deficit.     Motor: Weakness present.     Deep Tendon Reflexes: Reflexes are normal and symmetric.  Psychiatric:        Behavior: Behavior normal.        Thought Content: Thought content normal.        Judgment: Judgment normal.      BP 126/78    Pulse 88    Temp (!) 96.9 F (36.1 C) (Temporal)    Wt 290 lb 6.4 oz (131.7 kg)    BMI 38.31 kg/m      Assessment & Plan:  Matthew Pennington comes in today with chief complaint of Medical Management of Chronic Issues, Numbness (Both feet /), and Hand Pain (Losing grip )   Diagnosis and orders addressed:  1. Hypertension associated with diabetes (HCC) - enalapril (VASOTEC) 20 MG tablet; Take 1 tablet (20 mg total) by mouth daily.  Dispense: 90 tablet; Refill: 1 - CMP14+EGFR - CBC with Differential/Platelet  2. OSA (obstructive sleep apnea) - CMP14+EGFR - CBC with Differential/Platelet  3. Gastroesophageal reflux disease, unspecified whether  esophagitis present  - pantoprazole (PROTONIX) 40 MG tablet; Take 1 tablet (40 mg total) by mouth daily.  Dispense: 90 tablet; Refill: 1 - CMP14+EGFR - CBC with Differential/Platelet  4. Hyperlipidemia associated with type 2 diabetes mellitus (HCC) - CMP14+EGFR - CBC with Differential/Platelet  5. Type 2 diabetes mellitus with other specified complication, unspecified whether long term insulin use (HCC) - CMP14+EGFR - CBC with Differential/Platelet - Bayer DCA Hb A1c Waived  6. Moderate recurrent major depression (HCC) - CMP14+EGFR - CBC with Differential/Platelet  7. GAD (generalized anxiety disorder) - CMP14+EGFR - CBC with Differential/Platelet  8. Controlled substance agreement signed - CMP14+EGFR - CBC with Differential/Platelet  9. Right hand weakness  - Ambulatory referral to Hand Surgery - CMP14+EGFR - CBC with Differential/Platelet  10. Diabetes mellitus without complication (HCC) - empagliflozin (JARDIANCE) 25 MG  TABS tablet; Take 1 tablet (25 mg total) by mouth daily.  Dispense: 90 tablet; Refill: 1 - metFORMIN (GLUCOPHAGE) 500 MG tablet; TAKE 2 TABLETS BY MOUTH  ONCE DAILY WITH BREAKFAST  AND 1 TABLET BY MOUTH IN  THE EVENING  Dispense: 270 tablet; Refill: 0 - CMP14+EGFR - CBC with Differential/Platelet  11. Trigeminal neuralgia - gabapentin (NEURONTIN) 300 MG capsule; Take 1 capsule (300 mg total) by mouth 3 (three) times daily.  Dispense: 270 capsule; Refill: 1 - traMADol (ULTRAM) 50 MG tablet; Take 1 tablet (50 mg total) by mouth daily.  Dispense: 20 tablet; Refill: 2 - CMP14+EGFR - CBC with Differential/Platelet  12. Chronic bilateral low back pain without sciatica  - traMADol (ULTRAM) 50 MG tablet; Take 1 tablet (50 mg total) by mouth daily.  Dispense: 20 tablet; Refill: 2 - CMP14+EGFR - CBC with Differential/Platelet   Labs pending Patient reviewed in [place] controlled database, no flags noted. Contract and drug screen are up to date.  Health  Maintenance reviewed Diet and exercise encouraged  Follow up plan: 3 months    [name], FNP

## 2021-06-02 LAB — CMP14+EGFR
ALT: 64 IU/L — ABNORMAL HIGH (ref 0–44)
AST: 31 IU/L (ref 0–40)
Albumin/Globulin Ratio: 1.8 (ref 1.2–2.2)
Albumin: 4.8 g/dL (ref 3.8–4.9)
Alkaline Phosphatase: 76 IU/L (ref 44–121)
BUN/Creatinine Ratio: 12 (ref 9–20)
BUN: 14 mg/dL (ref 6–24)
Bilirubin Total: 0.3 mg/dL (ref 0.0–1.2)
CO2: 20 mmol/L (ref 20–29)
Calcium: 9.7 mg/dL (ref 8.7–10.2)
Chloride: 102 mmol/L (ref 96–106)
Creatinine, Ser: 1.15 mg/dL (ref 0.76–1.27)
Globulin, Total: 2.6 g/dL (ref 1.5–4.5)
Glucose: 187 mg/dL — ABNORMAL HIGH (ref 70–99)
Potassium: 4.6 mmol/L (ref 3.5–5.2)
Sodium: 139 mmol/L (ref 134–144)
Total Protein: 7.4 g/dL (ref 6.0–8.5)
eGFR: 75 mL/min/{1.73_m2} (ref >59)

## 2021-06-02 LAB — CBC WITH DIFFERENTIAL/PLATELET
Basophils Absolute: 0.1 10*3/uL (ref 0.0–0.2)
Basos: 1 %
EOS (ABSOLUTE): 0.2 10*3/uL (ref 0.0–0.4)
Eos: 2 %
Hematocrit: 51.1 % — ABNORMAL HIGH (ref 37.5–51.0)
Hemoglobin: 17.1 g/dL (ref 13.0–17.7)
Immature Grans (Abs): 0 10*3/uL (ref 0.0–0.1)
Immature Granulocytes: 0 %
Lymphocytes Absolute: 2.9 10*3/uL (ref 0.7–3.1)
Lymphs: 38 %
MCH: 30.7 pg (ref 26.6–33.0)
MCHC: 33.5 g/dL (ref 31.5–35.7)
MCV: 92 fL (ref 79–97)
Monocytes Absolute: 0.5 10*3/uL (ref 0.1–0.9)
Monocytes: 7 %
Neutrophils Absolute: 3.9 10*3/uL (ref 1.4–7.0)
Neutrophils: 52 %
Platelets: 275 10*3/uL (ref 150–450)
RBC: 5.57 x10E6/uL (ref 4.14–5.80)
RDW: 13.3 % (ref 11.6–15.4)
WBC: 7.6 10*3/uL (ref 3.4–10.8)

## 2021-06-03 DIAGNOSIS — H35033 Hypertensive retinopathy, bilateral: Secondary | ICD-10-CM | POA: Diagnosis not present

## 2021-06-08 ENCOUNTER — Other Ambulatory Visit: Payer: Self-pay

## 2021-06-08 ENCOUNTER — Ambulatory Visit: Payer: Medicare Other | Admitting: Orthopedic Surgery

## 2021-06-08 ENCOUNTER — Encounter: Payer: Self-pay | Admitting: Orthopedic Surgery

## 2021-06-08 ENCOUNTER — Ambulatory Visit: Payer: Self-pay

## 2021-06-08 VITALS — BP 120/73 | HR 96 | Ht 73.0 in | Wt 292.8 lb

## 2021-06-08 DIAGNOSIS — M79641 Pain in right hand: Secondary | ICD-10-CM

## 2021-06-08 DIAGNOSIS — M1811 Unilateral primary osteoarthritis of first carpometacarpal joint, right hand: Secondary | ICD-10-CM

## 2021-06-08 MED ORDER — DICLOFENAC SODIUM 1 % EX GEL: 2.0000 g | Freq: Four times a day (QID) | CUTANEOUS | 1 refills | Status: AC

## 2021-06-08 NOTE — Progress Notes (Signed)
Office Visit Note   Patient: Matthew Pennington           Date of Birth: January 21, 1966           MRN: [id number] Visit Date: 06/08/2021              Requested by: Junie Spencer, FNP [address] [place],  [place] [address] PCP: Junie Spencer, FNP   Assessment & Plan: Visit Diagnoses:  1. Pain in right hand   2. Arthritis of carpometacarpal (CMC) joint of right thumb     Plan: We discussed the diagnosis, prognosis, non-operative and operative treatment options for thumb CMC osteoarthritis.  After our discussion, the patient would like to proceed with topical NSAID use, brace immobilization, and hand therapy.  We reviewed the risks and benefits of conservative management.  The patient expressed understanding of the reasoning and strategy going forward.  All patient questions and concerns were addressed.    Follow-Up Instructions: No follow-ups on file.   Orders:  Orders Placed This Encounter  Procedures   XR Hand Complete Right   No orders of the defined types were placed in this encounter.     Procedures: No procedures performed   Clinical Data: No additional findings.   Subjective: Chief Complaint  Patient presents with   Right Hand - New Patient (Initial Visit)    This is a 56 year old right-hand-dominant male who is on disability for trigeminal neuralgia who presents with right basilar thumb pain.  This been going on for at least a year.  He denies any injury to the thumb.  Pain is localized at the base of the thumb at the [facility] joint.  Is worse with any attempted use of the hand.  He has no pain with range of motion at the thumb MP joint.  He has some numbness and paresthesias in his hand that is likely secondary to his diabetes and known peripheral neuropathy.  He has not tried any treatment of this basilar thumb pain.  He has taken tramadol occasionally for sleep.   Review of Systems   Objective: Vital Signs: BP 120/73 (BP Location: Left Arm, Patient  Position: Sitting, Cuff Size: Large)    Pulse 96    Ht 6\' 1"  (1.854 m)    Wt 292 lb 12.8 oz (132.8 kg)    SpO2 95%    BMI 38.63 kg/m   Physical Exam Constitutional:      Appearance: Normal appearance.  Cardiovascular:     Rate and Rhythm: Normal rate.     Pulses: Normal pulses.  Pulmonary:     Effort: Pulmonary effort is normal.  Skin:    General: Skin is warm and dry.     Capillary Refill: Capillary refill takes less than 2 seconds.  Neurological:     Mental Status: He is alert.    Right Hand Exam   Tenderness  Right hand tenderness location: TTP at base of thumb at [facility] joint w/ moderate swelling.  Other  Erythema: absent Sensation: normal Pulse: present  Comments:  Focused exam of the righ thumb: Swelling: Moderate CMC grind test: Positive Palmar abduction contracture: Neg Hyperextension of MPJ: Neg Finkelstein test: Neg Tenderness over 1st dorsal compartment: Nreg Thumb triggering: Neg        Specialty Comments:  No specialty comments available.  Imaging: 3 views of the right hand taken today are reviewed interpreted by me.  They demonstrate osteoarthritis of the base of the thumb at the [facility] joint.  He has Roderic Scarce stage III type arthritis with joint space narrowing and osteophytes.  There is no apparent involvement of the scaphotrapezoid or scaphotrapezial articulations.   PMFS History: Patient Active Problem List   Diagnosis Date Noted   Arthritis of carpometacarpal [facility]) joint of right thumb 06/08/2021   Hepatic steatosis 12/24/2019   Gastroesophageal reflux disease 08/23/2019   Controlled substance agreement signed 08/23/2019   Moderate recurrent major depression (HCC) 08/23/2019   OSA (obstructive sleep apnea) 12/05/2017   GAD (generalized anxiety disorder) 11/24/2017   Leg cramps, sleep related 11/24/2017   Hypertension associated with diabetes (HCC) 03/29/2017   Diabetes mellitus (HCC) 03/29/2017   Hyperlipidemia associated with type 2 diabetes  mellitus (HCC) 03/29/2017   Diverticulosis of large intestine without hemorrhage 03/29/2017   History of prostatitis 03/29/2017   Light headedness    Tingling    Weakness    Trigeminal neuralgia    Past Medical History:  Diagnosis Date   Diabetes mellitus without complication (HCC)    H/O echocardiogram 09/2017   normal EF   Headache    Hyperlipidemia    Hypertension    Light headedness    OSA on CPAP    Palpitations    Tingling    toes   Trigeminal neuralgia     Family History  Problem Relation Age of Onset   Diabetes Mother    Hyperlipidemia Mother    Hypertension Mother    Lung cancer Father    Heart disease Sister     Past Surgical History:  Procedure Laterality Date   NO PAST SURGERIES     Social History   Occupational History   Not on file  Tobacco Use   Smoking status: Never   Smokeless tobacco: Never  Vaping Use   Vaping Use: Never used  Substance and Sexual Activity   Alcohol use: No   Drug use: No   Sexual activity: Not on file

## 2021-06-09 ENCOUNTER — Telehealth: Payer: Self-pay | Admitting: Family

## 2021-06-10 ENCOUNTER — Other Ambulatory Visit: Payer: Self-pay

## 2021-06-10 ENCOUNTER — Encounter: Payer: Self-pay | Admitting: Occupational Therapy

## 2021-06-10 ENCOUNTER — Ambulatory Visit: Payer: Medicare Other | Admitting: Occupational Therapy

## 2021-06-10 DIAGNOSIS — M6281 Muscle weakness (generalized): Secondary | ICD-10-CM

## 2021-06-10 DIAGNOSIS — M79641 Pain in right hand: Secondary | ICD-10-CM

## 2021-06-10 DIAGNOSIS — R278 Other lack of coordination: Secondary | ICD-10-CM | POA: Diagnosis not present

## 2021-06-10 DIAGNOSIS — R600 Localized edema: Secondary | ICD-10-CM

## 2021-06-10 NOTE — Therapy (Signed)
OUTPATIENT OCCUPATIONAL THERAPY ORTHO EVALUATION  Patient Name: Matthew Pennington MRN: [id number] DOB:[birth year], 56 y.o., male Today's Date: 06/10/2021  PCP: Junie Spencer, FNP REFERRING PROVIDER: Marlyne Beards, MD   OT End of Session - 06/10/21 1530     Visit Number 1    Number of Visits 4    Date for OT Re-Evaluation 07/08/21    Authorization Type [employer] Medicare    Progress Note Due on Visit 10    OT Start Time 0145    OT Stop Time 0245    OT Time Calculation (min) 60 min    Equipment Utilized During Treatment Neoprene thumb spica splint, custom hand based thumb spica splint R    Activity Tolerance Patient tolerated treatment well    Behavior During Therapy WFL for tasks assessed/performed             Past Medical History:  Diagnosis Date   Diabetes mellitus without complication (HCC)    H/O echocardiogram 09/2017   normal EF   Headache    Hyperlipidemia    Hypertension    Light headedness    OSA on CPAP    Palpitations    Tingling    toes   Trigeminal neuralgia    Past Surgical History:  Procedure Laterality Date   NO PAST SURGERIES     Patient Active Problem List   Diagnosis Date Noted   Arthritis of carpometacarpal (CMC) joint of right thumb 06/08/2021   Hepatic steatosis 12/24/2019   Gastroesophageal reflux disease 08/23/2019   Controlled substance agreement signed 08/23/2019   Moderate recurrent major depression (HCC) 08/23/2019   OSA (obstructive sleep apnea) 12/05/2017   GAD (generalized anxiety disorder) 11/24/2017   Leg cramps, sleep related 11/24/2017   Hypertension associated with diabetes (HCC) 03/29/2017   Diabetes mellitus (HCC) 03/29/2017   Hyperlipidemia associated with type 2 diabetes mellitus (HCC) 03/29/2017   Diverticulosis of large intestine without hemorrhage 03/29/2017   History of prostatitis 03/29/2017   Light headedness    Tingling    Weakness    Trigeminal neuralgia     ONSET DATE: 05/2020 (~1 year ago)  REFERRING  DIAG: Diagnosis M18.11 (ICD-10-CM) - Arthritis of carpometacarpal (CMC) joint of right thumb   Referring Provider: Marlyne Beards, MD   THERAPY DIAG:  Visit Diagnoses:  1. Pain in right hand   2. Arthritis of carpometacarpal (CMC) joint of right thumb     SUBJECTIVE:   SUBJECTIVE STATEMENT: This is a 56 year old right-hand-dominant male whom presents per Dr Frazier Butt for OP OT hand therapy for custom splinting. He is on disability for trigeminal neuralgia & presents with right basilar thumb pain secondary to [facility] joint arthritis.  This been going on for at least a year. Pain is localized at the base of the thumb at the [facility] joint. He has worsening pain with functional use of the hand and difficulty performing ADL's and homemaking tasks. He has no pain with range of motion at the thumb MP joint. He reports a h/o paresthesias and neuropathy in bilateral hands and feet that is likely secondary to diabetes.    PERTINENT HISTORY: R CMC Arthritis, trigeminal neuralgia, diabetes, h/o numbness/tingling/paresthesias, anxiety, depression.    PAIN:  Are you having pain? Yes NPRS scale: 6/10 Pain location: R CMC thumb Pain orientation: Right  PAIN TYPE: aching, sharp, and throbbing Pain description: constant, sharp, stabbing, and aching  Aggravating factors: Movement, certain movements, activity. Relieving factors: Motrin, voltaren gel  PRECAUTIONS: None  HAND DOMINANCE: Right  WEIGHT BEARING  RESTRICTIONS No  FALLS: Has patient fallen in last 6 months? No,   PLOF: Independent  PATIENT GOALS Something to support the thumb, decreased pain.  OBJECTIVE:   DIAGNOSTIC FINDINGS: Right Hand Exam    Tenderness  Right hand tenderness location: TTP at base of thumb at [facility] joint w/ moderate swelling. R > L hand.   Comments:  Focused exam of the right thumb: Swelling: Moderate CMC grind test: Positive Palmar abduction contracture: Neg Hyperextension of MPJ: Neg Finkelstein test:  Neg Tenderness over 1st dorsal compartment: Neg Thumb triggering: Neg Pt is positive for pain in right thumb w/ A/ROM and resisted thumb testing.   Imaging: 3 views of the right hand taken at his last MD appointment and interpreted by Dr Frazier Butt as follows: They demonstrated osteoarthritis of the base of the thumb at the [facility] joint.  He has Roderic Scarce stage III type arthritis with joint space narrowing and osteophytes.  There is no apparent involvement of the scaphotrapezoid or scaphotrapezial articulations.  COGNITION: Overall cognitive status: Within functional limits for tasks assessed  ADLs: Overall ADLs: Difficulty opening items, containers, buttoning shirts, can't get watch on, needs assistance to tie shoes, difficulty writing.  SENSATION: Light touch:  Pt reports h/o neuropathy in bilateral hands and feet. He takes gabapentin.  Semmes Weinstein Monofilament scale: Diminished light touch (3.22 - 3.61) digits 1-5   EDEMA: Moderate edema bilateral hands R > L  UE AROM/PROM: Pt with A/ROM WNL's overall with pain in thumb CMC R noted. He is negative for pain at his MP and IP.   HAND FUNCTION:  Grip strength: Right: Comments: NT secondary to pain due to [facility] Arthritis  TODAY'S TREATMENT:  Pt was fitted with a custom fabricated hand based thumb spica splint that places his hand in the functional position of slight rotation and palmar ABD. This splint should allow for support to his thumb during daily activities. He and his wife were educated in splinting use, care and precautions. He was also issued a pre-fabricated neoprene thumb spica for use during activity when he needs light support and pain control. Pt can alternate between these two splints PRN.  Pt and his wife were also educated in ADL's and instructed in joint protection techniques and that using wide grip handles cause less stress on smaller joints (Pens, utensils, toothbrush etc).   PATIENT EDUCATION: Education details:  Splinting use, care and precautions, joint protection principles  Person educated: Patient and Spouse Education method: Explanation, Demonstration, and Handouts Education comprehension: verbalized understanding and returned demonstration  ASSESSMENT:  CLINICAL IMPRESSION: Patient is a 56 y.o. R HD male who was seen today for occupational therapy evaluation and treatment for R thumb CMC OA. He was fitted with a custom R thumb spica splint and a pre-fab neoprene thumb spica splint. He was educated in splinting use, care and precautions and will use these PRN for support during functional activity. He was also educated in basic joint protection principles and to use larger joints when able vs using larger grip handles that allow for less stress on small joints of the hand. Patient has performance deficits in functional skills including ADLs, coordination, dexterity, edema, ROM, and pain. These impairments are limiting patient from ADLs and leisure. Patient has co-morbidities such as diabetes and h/o paresthesias/neuropathy in his hands and feet that affects occupational performance.  He will benefit from skilled OT over the next 2-3 weeks for splint adjustments & pt education PRN. He plans to f/u with MD in~3 months.  MODIFICATION OR ASSISTANCE TO COMPLETE EVALUATION: No modification of tasks or assist necessary to complete an evaluation.  OT OCCUPATIONAL PROFILE AND HISTORY: Problem focused assessment: Including review of records relating to presenting problem.  CLINICAL DECISION MAKING: LOW - limited treatment options, no task modification necessary  REHAB POTENTIAL: Good  EVALUATION COMPLEXITY: Low     GOALS: Goals reviewed with patient? No  SHORT TERM GOALS: (STG's = LTG's)  STG Name Target Date Goal status  1 Pt will be Mod I with splinting use, care and precautions for R UE Baseline:  07/08/2021 INITIAL  2 Pt will be Mod I with basic joint protection techniques after educated  verbally and via handouts Baseline:  07/08/2021 INITIAL                            PLAN: OT FREQUENCY: 1x/week  OT DURATION: 4 weeks  PLANNED INTERVENTIONS: self care/ADL training, therapeutic activity, splinting, patient/family education, and DME and/or AE instructions  PLAN FOR NEXT SESSION: Splint check and adjustments PRN R CMC splint. Pt education/Issue handout for joint protection techniques.  RECOMMENDED OTHER SERVICES: F/U with MD in ~3 months as scheduled.  CONSULTED AND AGREED WITH PLAN OF CARE: Patient and family member/caregiver  WEARING SCHEDULE:  Wear splint as needed for support to the base of your thumb.  PURPOSE:  To provide support yo the joint at the base of your thumb.  CARE OF SPLINT:  Keep splint away from heat sources including: stove, radiator or furnace, or a car in sunlight. The splint can melt and will no longer fit you properly  Keep away from pets and children  Clean the splint with rubbing alcohol or cool water and soap as needed.  * During this time, make sure you also clean your hand/arm as instructed by your therapist and/or perform dressing changes as needed. Then dry hand/arm completely before replacing splint. (When cleaning hand/arm, keep it immobilized in same position until splint is replaced)  PRECAUTIONS/POTENTIAL PROBLEMS: *If you notice or experience increased pain, swelling, numbness, or a lingering reddened area from the splint: Contact your therapist immediately by calling [phone]. You must wear the splint for protection, but we will get you scheduled for adjustments as quickly as possible.  (If only straps or hooks need to be replaced and NO adjustments to the splint need to be made, just call the office ahead and let them know you are coming in)  If you have any medical concerns or signs of infection, please call your doctor immediately  Alm Bustard, OT/L 06/10/2021, 3:35 PM

## 2021-06-10 NOTE — Patient Instructions (Signed)
WEARING SCHEDULE:  Wear splint as needed for support to the base of your thumb.  PURPOSE:  To provide support yo the joint at the base of your thumb.  CARE OF SPLINT:  Keep splint away from heat sources including: stove, radiator or furnace, or a car in sunlight. The splint can melt and will no longer fit you properly  Keep away from pets and children  Clean the splint with rubbing alcohol or cool water and soap as needed.  * During this time, make sure you also clean your hand/arm as instructed by your therapist and/or perform dressing changes as needed. Then dry hand/arm completely before replacing splint. (When cleaning hand/arm, keep it immobilized in same position until splint is replaced)  PRECAUTIONS/POTENTIAL PROBLEMS: *If you notice or experience increased pain, swelling, numbness, or a lingering reddened area from the splint: Contact your therapist immediately by calling [phone]. You must wear the splint for protection, but we will get you scheduled for adjustments as quickly as possible.  (If only straps or hooks need to be replaced and NO adjustments to the splint need to be made, just call the office ahead and let them know you are coming in)  If you have any medical concerns or signs of infection, please call your doctor immediately

## 2021-06-11 DIAGNOSIS — M542 Cervicalgia: Secondary | ICD-10-CM | POA: Diagnosis not present

## 2021-06-11 DIAGNOSIS — M9902 Segmental and somatic dysfunction of thoracic region: Secondary | ICD-10-CM | POA: Diagnosis not present

## 2021-06-11 DIAGNOSIS — S29012A Strain of muscle and tendon of back wall of thorax, initial encounter: Secondary | ICD-10-CM | POA: Diagnosis not present

## 2021-06-11 DIAGNOSIS — M9901 Segmental and somatic dysfunction of cervical region: Secondary | ICD-10-CM | POA: Diagnosis not present

## 2021-06-11 DIAGNOSIS — S338XXA Sprain of other parts of lumbar spine and pelvis, initial encounter: Secondary | ICD-10-CM | POA: Diagnosis not present

## 2021-06-11 DIAGNOSIS — M47812 Spondylosis without myelopathy or radiculopathy, cervical region: Secondary | ICD-10-CM | POA: Diagnosis not present

## 2021-06-11 DIAGNOSIS — M9903 Segmental and somatic dysfunction of lumbar region: Secondary | ICD-10-CM | POA: Diagnosis not present

## 2021-06-11 DIAGNOSIS — M47816 Spondylosis without myelopathy or radiculopathy, lumbar region: Secondary | ICD-10-CM | POA: Diagnosis not present

## 2021-06-16 ENCOUNTER — Encounter: Payer: Medicare Other | Admitting: Occupational Therapy

## 2021-06-16 ENCOUNTER — Encounter: Payer: Self-pay | Admitting: Family Medicine

## 2021-06-18 NOTE — Telephone Encounter (Signed)
For faxed back to [facility]

## 2021-06-21 DIAGNOSIS — G4733 Obstructive sleep apnea (adult) (pediatric): Secondary | ICD-10-CM | POA: Diagnosis not present

## 2021-06-24 ENCOUNTER — Encounter: Payer: Medicare Other | Admitting: Occupational Therapy

## 2021-06-24 DIAGNOSIS — S29012A Strain of muscle and tendon of back wall of thorax, initial encounter: Secondary | ICD-10-CM | POA: Diagnosis not present

## 2021-06-24 DIAGNOSIS — M9903 Segmental and somatic dysfunction of lumbar region: Secondary | ICD-10-CM | POA: Diagnosis not present

## 2021-06-24 DIAGNOSIS — S338XXA Sprain of other parts of lumbar spine and pelvis, initial encounter: Secondary | ICD-10-CM | POA: Diagnosis not present

## 2021-06-24 DIAGNOSIS — M9902 Segmental and somatic dysfunction of thoracic region: Secondary | ICD-10-CM | POA: Diagnosis not present

## 2021-06-24 DIAGNOSIS — M542 Cervicalgia: Secondary | ICD-10-CM | POA: Diagnosis not present

## 2021-06-24 DIAGNOSIS — M47812 Spondylosis without myelopathy or radiculopathy, cervical region: Secondary | ICD-10-CM | POA: Diagnosis not present

## 2021-06-24 DIAGNOSIS — M9901 Segmental and somatic dysfunction of cervical region: Secondary | ICD-10-CM | POA: Diagnosis not present

## 2021-06-24 DIAGNOSIS — M47816 Spondylosis without myelopathy or radiculopathy, lumbar region: Secondary | ICD-10-CM | POA: Diagnosis not present

## 2021-07-08 DIAGNOSIS — M542 Cervicalgia: Secondary | ICD-10-CM | POA: Diagnosis not present

## 2021-07-08 DIAGNOSIS — S338XXA Sprain of other parts of lumbar spine and pelvis, initial encounter: Secondary | ICD-10-CM | POA: Diagnosis not present

## 2021-07-08 DIAGNOSIS — M9902 Segmental and somatic dysfunction of thoracic region: Secondary | ICD-10-CM | POA: Diagnosis not present

## 2021-07-08 DIAGNOSIS — S29012A Strain of muscle and tendon of back wall of thorax, initial encounter: Secondary | ICD-10-CM | POA: Diagnosis not present

## 2021-07-08 DIAGNOSIS — M47812 Spondylosis without myelopathy or radiculopathy, cervical region: Secondary | ICD-10-CM | POA: Diagnosis not present

## 2021-07-08 DIAGNOSIS — M47816 Spondylosis without myelopathy or radiculopathy, lumbar region: Secondary | ICD-10-CM | POA: Diagnosis not present

## 2021-07-08 DIAGNOSIS — M9903 Segmental and somatic dysfunction of lumbar region: Secondary | ICD-10-CM | POA: Diagnosis not present

## 2021-07-08 DIAGNOSIS — M9901 Segmental and somatic dysfunction of cervical region: Secondary | ICD-10-CM | POA: Diagnosis not present

## 2021-07-18 ENCOUNTER — Other Ambulatory Visit: Payer: Self-pay | Admitting: Family

## 2021-07-18 DIAGNOSIS — E1159 Type 2 diabetes mellitus with other circulatory complications: Secondary | ICD-10-CM

## 2021-07-18 DIAGNOSIS — K219 Gastro-esophageal reflux disease without esophagitis: Secondary | ICD-10-CM

## 2021-07-27 DIAGNOSIS — S29012A Strain of muscle and tendon of back wall of thorax, initial encounter: Secondary | ICD-10-CM | POA: Diagnosis not present

## 2021-07-27 DIAGNOSIS — M9902 Segmental and somatic dysfunction of thoracic region: Secondary | ICD-10-CM | POA: Diagnosis not present

## 2021-07-27 DIAGNOSIS — M47816 Spondylosis without myelopathy or radiculopathy, lumbar region: Secondary | ICD-10-CM | POA: Diagnosis not present

## 2021-07-27 DIAGNOSIS — S338XXA Sprain of other parts of lumbar spine and pelvis, initial encounter: Secondary | ICD-10-CM | POA: Diagnosis not present

## 2021-07-27 DIAGNOSIS — M47812 Spondylosis without myelopathy or radiculopathy, cervical region: Secondary | ICD-10-CM | POA: Diagnosis not present

## 2021-07-27 DIAGNOSIS — M542 Cervicalgia: Secondary | ICD-10-CM | POA: Diagnosis not present

## 2021-07-27 DIAGNOSIS — M9903 Segmental and somatic dysfunction of lumbar region: Secondary | ICD-10-CM | POA: Diagnosis not present

## 2021-07-27 DIAGNOSIS — M9901 Segmental and somatic dysfunction of cervical region: Secondary | ICD-10-CM | POA: Diagnosis not present

## 2021-08-11 ENCOUNTER — Telehealth: Payer: Self-pay | Admitting: Family

## 2021-08-11 DIAGNOSIS — S338XXA Sprain of other parts of lumbar spine and pelvis, initial encounter: Secondary | ICD-10-CM | POA: Diagnosis not present

## 2021-08-11 DIAGNOSIS — M47812 Spondylosis without myelopathy or radiculopathy, cervical region: Secondary | ICD-10-CM | POA: Diagnosis not present

## 2021-08-11 DIAGNOSIS — M9903 Segmental and somatic dysfunction of lumbar region: Secondary | ICD-10-CM | POA: Diagnosis not present

## 2021-08-11 DIAGNOSIS — M9901 Segmental and somatic dysfunction of cervical region: Secondary | ICD-10-CM | POA: Diagnosis not present

## 2021-08-11 DIAGNOSIS — M542 Cervicalgia: Secondary | ICD-10-CM | POA: Diagnosis not present

## 2021-08-11 DIAGNOSIS — M9902 Segmental and somatic dysfunction of thoracic region: Secondary | ICD-10-CM | POA: Diagnosis not present

## 2021-08-11 DIAGNOSIS — M47816 Spondylosis without myelopathy or radiculopathy, lumbar region: Secondary | ICD-10-CM | POA: Diagnosis not present

## 2021-08-11 DIAGNOSIS — S29012A Strain of muscle and tendon of back wall of thorax, initial encounter: Secondary | ICD-10-CM | POA: Diagnosis not present

## 2021-08-11 NOTE — Telephone Encounter (Signed)
No samples at this time patient aware via voice mail.  ?

## 2021-08-25 ENCOUNTER — Encounter: Payer: Self-pay | Admitting: Nurse Practitioner

## 2021-08-25 ENCOUNTER — Ambulatory Visit (INDEPENDENT_AMBULATORY_CARE_PROVIDER_SITE_OTHER): Payer: Medicare Other | Admitting: Nurse Practitioner

## 2021-08-25 VITALS — BP 109/65 | HR 75 | Temp 98.7°F | Ht 73.0 in | Wt 287.0 lb

## 2021-08-25 DIAGNOSIS — H1032 Unspecified acute conjunctivitis, left eye: Secondary | ICD-10-CM

## 2021-08-25 DIAGNOSIS — H9202 Otalgia, left ear: Secondary | ICD-10-CM | POA: Diagnosis not present

## 2021-08-25 MED ORDER — OFLOXACIN 0.3 % OT SOLN: 10.0000 [drp] | Freq: Every day | OTIC | 0 refills | Status: DC

## 2021-08-25 MED ORDER — BACITRACIN-POLYMYXIN B 500-10000 UNIT/GM OP OINT: 1.0000 "application " | TOPICAL_OINTMENT | Freq: Two times a day (BID) | OPHTHALMIC | 1 refills | Status: DC

## 2021-08-25 NOTE — Patient Instructions (Signed)
Bacterial Conjunctivitis, Adult ?Bacterial conjunctivitis is an infection of the clear membrane that covers the white part of the eye and the inner surface of the eyelid (conjunctiva). When the blood vessels in the conjunctiva become inflamed, the eye becomes red or pink. The eye often feels irritated or itchy. Bacterial conjunctivitis spreads easily from person to person (is contagious). It also spreads easily from one eye to the other eye. ?What are the causes? ?This condition is caused by bacteria. You may get the infection if you come into close contact with: ?A person who is infected with the bacteria. ?Items that are contaminated with the bacteria, such as a face towel, contact lens solution, or eye makeup. ?What increases the risk? ?You are more likely to develop this condition if: ?You are exposed to other people who have the infection. ?You wear contact lenses. ?You have a sinus infection. ?You have had a recent eye injury or surgery. ?You have a weak body defense system (immune system). ?You have a medical condition that causes dry eyes. ?What are the signs or symptoms? ?Symptoms of this condition include: ?Thick, yellowish discharge from the eye. This may turn into a crust on the eyelid overnight and cause your eyelids to stick together. ?Tearing or watery eyes. ?Itchy eyes. ?Burning feeling in your eyes. ?Eye redness. ?Swollen eyelids. ?Blurred vision. ?How is this diagnosed? ?This condition is diagnosed based on your symptoms and medical history. Your health care provider may also take a sample of discharge from your eye to find the cause of your infection. ?How is this treated? ?This condition may be treated with: ?Antibiotic eye drops or ointment to clear the infection more quickly and prevent the spread of infection to others. ?Antibiotic medicines taken by mouth (orally) to treat infections that do not respond to drops or ointments or that last longer than 10 days. ?Cool, wet cloths (cool  compresses) placed on the eyes. ?Artificial tears applied 2-6 times a day. ?Follow these instructions at home: ?Medicines ?Take or apply your antibiotic medicine as told by your health care provider. Do not stop using the antibiotic, even if your condition improves, unless directed by your health care provider. ?Take or apply over-the-counter and prescription medicines only as told by your health care provider. ?Be very careful to avoid touching the edge of your eyelid with the eye-drop bottle or the ointment tube when you apply medicines to the affected eye. This will keep you from spreading the infection to your other eye or to other people. ?Managing discomfort ?Gently wipe away any drainage from your eye with a warm, wet washcloth or a cotton ball. ?Apply a clean, cool compress to your eye for 10-20 minutes, 3-4 times a day. ?General instructions ?Do not wear contact lenses until the inflammation is gone and your health care provider says it is safe to wear them again. Ask your health care provider how to sterilize or replace your contact lenses before you use them again. Wear glasses until you can resume wearing contact lenses. ?Avoid wearing eye makeup until the inflammation is gone. Throw away any old eye cosmetics that may be contaminated. ?Change or wash your pillowcase every day. ?Do not share towels or washcloths. This may spread the infection. ?Wash your hands often with soap and water for at least 20 seconds and especially before touching your face or eyes. Use paper towels to dry your hands. ?Avoid touching or rubbing your eyes. ?Do not drive or use heavy machinery if your vision is blurred. ?Contact   a health care provider if: ?You have a fever. ?Your symptoms do not get better after 10 days. ?Get help right away if: ?You have a fever and your symptoms suddenly get worse. ?You have severe pain when you move your eye. ?You have facial pain, redness, or swelling. ?You have a sudden loss of  vision. ?Summary ?Bacterial conjunctivitis is an infection of the clear membrane that covers the white part of the eye and the inner surface of the eyelid (conjunctiva). ?Bacterial conjunctivitis spreads easily from eye to eye and from person to person (is contagious). ?Wash your hands often with soap and water for at least 20 seconds and especially before touching your face or eyes. Use paper towels to dry your hands. ?Take or apply your antibiotic medicine as told by your health care provider. Do not stop using the antibiotic even if your condition improves. ?Contact a health care provider if you have a fever or if your symptoms do not get better after 10 days. Get help right away if you have a sudden loss of vision. ?This information is not intended to replace advice given to you by your health care provider. Make sure you discuss any questions you have with your health care provider. ?Document Revised: 08/26/2020 Document Reviewed: 08/26/2020 ?Elsevier Patient Education ? [place]. ?Otitis Media, Adult ?Otitis media is a condition in which the middle ear is red and swollen (inflamed) and full of fluid. The middle ear is the part of the ear that contains bones for hearing as well as air that helps send sounds to the brain. The condition usually goes away on its own. ?What are the causes? ?This condition is caused by a blockage in the eustachian tube. This tube connects the middle ear to the back of the nose. It normally allows air into the middle ear. The blockage is caused by fluid or swelling. Problems that can cause blockage include: ?A cold or infection that affects the nose, mouth, or throat. ?Allergies. ?An irritant, such as tobacco smoke. ?Adenoids that have become large. The adenoids are soft tissue located in the back of the throat, behind the nose and the roof of the mouth. ?Growth or swelling in the upper part of the throat, just behind the nose (nasopharynx). ?Damage to the ear caused by a  change in pressure. This is called barotrauma. ?What increases the risk? ?You are more likely to develop this condition if you: ?Smoke or are exposed to tobacco smoke. ?Have an opening in the roof of your mouth (cleft palate). ?Have acid reflux. ?Have problems in your body's defense system (immune system). ?What are the signs or symptoms? ?Symptoms of this condition include: ?Ear pain. ?Fever. ?Problems with hearing. ?Being tired. ?Fluid leaking from the ear. ?Ringing in the ear. ?How is this treated? ?This condition can go away on its own within 3-5 days. But if the condition is caused by germs (bacteria) and does not go away on its own, or if it keeps coming back, your doctor may: ?Give you antibiotic medicines. ?Give you medicines for pain. ?Follow these instructions at home: ?Take over-the-counter and prescription medicines only as told by your doctor. ?If you were prescribed an antibiotic medicine, take it as told by your doctor. Do not stop taking it even if you start to feel better. ?Keep all follow-up visits. ?Contact a doctor if: ?You have bleeding from your nose. ?There is a lump on your neck. ?You are not feeling better in 5 days. ?You feel worse instead  of better. ?Get help right away if: ?You have pain that is not helped with medicine. ?You have swelling, redness, or pain around your ear. ?You get a stiff neck. ?You cannot move part of your face (paralysis). ?You notice that the bone behind your ear hurts when you touch it. ?You get a very bad headache. ?Summary ?Otitis media means that the middle ear is red, swollen, and full of fluid. ?This condition usually goes away on its own. ?If the problem does not go away, treatment may be needed. You may be given medicines to treat the infection or to treat your pain. ?If you were prescribed an antibiotic medicine, take it as told by your doctor. Do not stop taking it even if you start to feel better. ?Keep all follow-up visits. ?This information is not  intended to replace advice given to you by your health care provider. Make sure you discuss any questions you have with your health care provider. ?Document Revised: 08/24/2020 Document Reviewed: 08/24/2020 ?[employer]

## 2021-08-25 NOTE — Progress Notes (Signed)
? ?Acute Office Visit ? ?Subjective:  ? ? Patient ID: Matthew Pennington, male    DOB: 1966-05-10, 56 y.o.   MRN: [id number] ? ?Chief Complaint  ?Patient presents with  ? Ear Pain  ?  Left side  ? ? ?Otalgia  ?There is pain in the left ear. This is a new problem. Episode onset: in the past 3 days. The problem has been unchanged. There has been no fever. The pain is mild. Pertinent negatives include no ear discharge, rash or sore throat. He has tried nothing for the symptoms.  ?Conjunctivitis  ?The current episode started 2 days ago. The onset was gradual. The problem has been unchanged. The problem is mild. Nothing relieves the symptoms. Nothing aggravates the symptoms. Associated symptoms include ear pain. Pertinent negatives include no fever, no ear discharge, no mouth sores, no sore throat and no rash.  ? ? ?Past Medical History:  ?Diagnosis Date  ? Diabetes mellitus without complication (HCC)   ? H/O echocardiogram 09/2017  ? normal EF  ? Headache   ? Hyperlipidemia   ? Hypertension   ? Light headedness   ? OSA on CPAP   ? Palpitations   ? Tingling   ? toes  ? Trigeminal neuralgia   ? ? ?Past Surgical History:  ?Procedure Laterality Date  ? NO PAST SURGERIES    ? ? ?Family History  ?Problem Relation Age of Onset  ? Diabetes Mother   ? Hyperlipidemia Mother   ? Hypertension Mother   ? Lung cancer Father   ? Heart disease Sister   ? ? ?Social History  ? ?Socioeconomic History  ? Marital status: Married  ?  Spouse name: Lambert Mody  ? Number of children: 3  ? Years of education: Not on file  ? Highest education level: Associate degree: academic program  ?Occupational History  ? Not on file  ?Tobacco Use  ? Smoking status: Never  ? Smokeless tobacco: Never  ?Vaping Use  ? Vaping Use: Never used  ?Substance and Sexual Activity  ? Alcohol use: No  ? Drug use: No  ? Sexual activity: Not on file  ?Other Topics Concern  ? Not on file  ?Social History Narrative  ? 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 Caffeine coffee 1  cup  ? 3 sons, live near by.  ? ?Social Determinants of Health  ? ?Financial Resource Strain: Low Risk   ? Difficulty of Paying Living Expenses: Not hard at all  ?Food Insecurity: No Food Insecurity  ? Worried About Programme researcher, broadcasting/film/video in the Last Year: Never true  ? Ran Out of Food in the Last Year: Never true  ?Transportation Needs: Unmet Transportation Needs  ? Lack of Transportation (Medical): Yes  ? Lack of Transportation (Non-Medical): Yes  ?Physical Activity: Insufficiently Active  ? Days of Exercise per Week: 2 days  ? Minutes of Exercise per Session: 10 min  ?Stress: No Stress Concern Present  ? Feeling of Stress : Only a little  ?Social Connections: Socially Integrated  ? Frequency of Communication with Friends and Family: More than three times a week  ? Frequency of Social Gatherings with Friends and Family: More than three times a week  ? Attends Religious Services: More than 4 times per year  ? Active Member of Clubs or Organizations: Yes  ? Attends Banker Meetings: More than 4 times per year  ? Marital Status: Married  ?Intimate Partner Violence: Not At Risk  ? Fear  of Current or Ex-Partner: No  ? Emotionally Abused: No  ? Physically Abused: No  ? Sexually Abused: No  ? ? ?Outpatient Medications Prior to Visit  ?Medication Sig Dispense Refill  ? Blood Glucose Calibration (ONETOUCH VERIO) High SOLN     ? Blood Glucose Monitoring Suppl (ONETOUCH VERIO REFLECT) w/Device KIT     ? empagliflozin (JARDIANCE) 25 MG TABS tablet Take 1 tablet (25 mg total) by mouth daily. 90 tablet 1  ? enalapril (VASOTEC) 20 MG tablet Take 1 tablet (20 mg total) by mouth daily. 90 tablet 1  ? ezetimibe (ZETIA) 10 MG tablet Take 1 tablet (10 mg total) by mouth daily. 90 tablet 1  ? gabapentin (NEURONTIN) 300 MG capsule Take 1 capsule (300 mg total) by mouth 3 (three) times daily. 270 capsule 1  ? glucose blood (ONETOUCH VERIO) test strip     ? ibuprofen (ADVIL) 800 MG tablet Take 1 tablet (800 mg total) by mouth  daily. 90 tablet 5  ? metFORMIN (GLUCOPHAGE) 500 MG tablet TAKE 2 TABLETS BY MOUTH  ONCE DAILY WITH BREAKFAST  AND 1 TABLET BY MOUTH IN  THE EVENING 270 tablet 0  ? oxcarbazepine (TRILEPTAL) 600 MG tablet Take 1 tablet (600 mg total) by mouth 3 (three) times daily. 270 tablet 2  ? oxyCODONE-acetaminophen (PERCOCET) 10-325 MG tablet Take 1 tablet by mouth every 6 (six) hours as needed for pain.    ? pantoprazole (PROTONIX) 40 MG tablet Take 1 tablet (40 mg total) by mouth daily. 90 tablet 1  ? rOPINIRole (REQUIP) 0.25 MG tablet TAKE 1 TO 2 TABLETS BY MOUTH AT BEDTIME FOR  RESTLESS  LEGS 180 tablet 1  ? traMADol (ULTRAM) 50 MG tablet Take 1 tablet (50 mg total) by mouth daily. 20 tablet 2  ? Lancet Devices (SIMPLE DIAGNOSTICS LANCING DEV) MISC Apply topically.    ? Pharmacist Choice Lancets MISC     ? ?No facility-administered medications prior to visit.  ? ? ?Allergies  ?Allergen Reactions  ? Morphine And Related Other (See Comments)  ?  "Makes me go Crazy"  ? Statins Other (See Comments)  ?  Joint/mucles pains  ? ? ?Review of Systems  ?Constitutional: Negative.  Negative for chills and fever.  ?HENT:  Positive for ear pain. Negative for ear discharge, mouth sores, sinus pressure, sneezing and sore throat.   ?Eyes: Negative.   ?Respiratory: Negative.    ?Cardiovascular: Negative.   ?Gastrointestinal: Negative.   ?Genitourinary: Negative.   ?Skin: Negative.  Negative for rash.  ?Neurological:  Negative for dizziness.  ?All other systems reviewed and are negative. ? ?   ?Objective:  ?  ?Physical Exam ?Vitals and nursing note reviewed.  ?Constitutional:   ?   Appearance: Normal appearance.  ?HENT:  ?   Head: Normocephalic.  ?   Right Ear: External ear normal. No decreased hearing noted. No drainage or tenderness. There is impacted cerumen.  ?   Left Ear: External ear normal. No decreased hearing noted. Tenderness present. No drainage. There is no impacted cerumen. No foreign body.  ?   Nose: Nose normal. No congestion.   ?   Mouth/Throat:  ?   Mouth: Mucous membranes are moist.  ?   Pharynx: Oropharynx is clear.  ?Eyes:  ?   Conjunctiva/sclera: Conjunctivae normal.  ?Cardiovascular:  ?   Rate and Rhythm: Normal rate and regular rhythm.  ?   Pulses: Normal pulses.  ?   Heart sounds: Normal heart sounds.  ?Pulmonary:  ?  Effort: Pulmonary effort is normal.  ?   Breath sounds: Normal breath sounds.  ?Abdominal:  ?   General: Bowel sounds are normal.  ?Skin: ?   General: Skin is warm.  ?   Findings: No rash.  ?Neurological:  ?   Mental Status: He is alert and oriented to person, place, and time. Mental status is at baseline.  ?Psychiatric:     ?   Behavior: Behavior normal.  ? ? ? ?BP 109/65   Pulse 75   Temp 98.7 ?F (37.1 ?C)   Ht 6\' 1"  (1.854 m)   Wt 287 lb (130.2 kg)   SpO2 93%   BMI 37.87 kg/m?  ?Wt Readings from Last 3 Encounters:  ?08/25/21 287 lb (130.2 kg)  ?06/08/21 292 lb 12.8 oz (132.8 kg)  ?06/01/21 290 lb 6.4 oz (131.7 kg)  ? ? ?Health Maintenance Due  ?Topic Date Due  ? COVID-19 Vaccine (1) Never done  ? Zoster Vaccines- Shingrix (1 of 2) Never done  ? OPHTHALMOLOGY EXAM  06/29/2021  ? ? ?There are no preventive care reminders to display for this patient. ? ? ?Lab Results  ?Component Value Date  ? TSH 1.020 04/20/2018  ? ?Lab Results  ?Component Value Date  ? WBC 7.6 06/01/2021  ? HGB 17.1 06/01/2021  ? HCT 51.1 (H) 06/01/2021  ? MCV 92 06/01/2021  ? PLT 275 06/01/2021  ? ?Lab Results  ?Component Value Date  ? NA 139 06/01/2021  ? K 4.6 06/01/2021  ? CO2 20 06/01/2021  ? GLUCOSE 187 (H) 06/01/2021  ? BUN 14 06/01/2021  ? CREATININE 1.15 06/01/2021  ? BILITOT 0.3 06/01/2021  ? ALKPHOS 76 06/01/2021  ? AST 31 06/01/2021  ? ALT 64 (H) 06/01/2021  ? PROT 7.4 06/01/2021  ? ALBUMIN 4.8 06/01/2021  ? CALCIUM 9.7 06/01/2021  ? ANIONGAP 10 07/07/2018  ? EGFR 75 06/01/2021  ? ?Lab Results  ?Component Value Date  ? CHOL 204 (H) 08/23/2019  ? ?Lab Results  ?Component Value Date  ? HDL 31 (L) 08/23/2019  ? ?Lab Results   ?Component Value Date  ? LDLCALC 120 (H) 08/23/2019  ? ?Lab Results  ?Component Value Date  ? TRIG 301 (H) 08/23/2019  ? ?Lab Results  ?Component Value Date  ? CHOLHDL 6.6 (H) 08/23/2019  ? ?Lab Results  ?[place]

## 2021-08-31 ENCOUNTER — Ambulatory Visit: Payer: Medicare Other | Admitting: Family

## 2021-09-01 ENCOUNTER — Encounter: Payer: Self-pay | Admitting: Family

## 2021-09-01 ENCOUNTER — Ambulatory Visit (INDEPENDENT_AMBULATORY_CARE_PROVIDER_SITE_OTHER): Payer: Medicare Other

## 2021-09-01 ENCOUNTER — Ambulatory Visit (INDEPENDENT_AMBULATORY_CARE_PROVIDER_SITE_OTHER): Payer: Medicare Other | Admitting: Family

## 2021-09-01 VITALS — BP 114/77 | HR 76 | Temp 98.2°F | Ht 73.0 in | Wt 288.8 lb

## 2021-09-01 DIAGNOSIS — F331 Major depressive disorder, recurrent, moderate: Secondary | ICD-10-CM

## 2021-09-01 DIAGNOSIS — Z79899 Other long term (current) drug therapy: Secondary | ICD-10-CM | POA: Diagnosis not present

## 2021-09-01 DIAGNOSIS — M67471 Ganglion, right ankle and foot: Secondary | ICD-10-CM

## 2021-09-01 DIAGNOSIS — F411 Generalized anxiety disorder: Secondary | ICD-10-CM

## 2021-09-01 DIAGNOSIS — M6740 Ganglion, unspecified site: Secondary | ICD-10-CM | POA: Diagnosis not present

## 2021-09-01 DIAGNOSIS — E1159 Type 2 diabetes mellitus with other circulatory complications: Secondary | ICD-10-CM | POA: Diagnosis not present

## 2021-09-01 DIAGNOSIS — M545 Low back pain, unspecified: Secondary | ICD-10-CM | POA: Diagnosis not present

## 2021-09-01 DIAGNOSIS — G4733 Obstructive sleep apnea (adult) (pediatric): Secondary | ICD-10-CM

## 2021-09-01 DIAGNOSIS — G8929 Other chronic pain: Secondary | ICD-10-CM

## 2021-09-01 DIAGNOSIS — G500 Trigeminal neuralgia: Secondary | ICD-10-CM | POA: Diagnosis not present

## 2021-09-01 DIAGNOSIS — M85671 Other cyst of bone, right ankle and foot: Secondary | ICD-10-CM | POA: Diagnosis not present

## 2021-09-01 DIAGNOSIS — E785 Hyperlipidemia, unspecified: Secondary | ICD-10-CM | POA: Diagnosis not present

## 2021-09-01 DIAGNOSIS — E1169 Type 2 diabetes mellitus with other specified complication: Secondary | ICD-10-CM | POA: Diagnosis not present

## 2021-09-01 DIAGNOSIS — I152 Hypertension secondary to endocrine disorders: Secondary | ICD-10-CM

## 2021-09-01 DIAGNOSIS — K219 Gastro-esophageal reflux disease without esophagitis: Secondary | ICD-10-CM | POA: Diagnosis not present

## 2021-09-01 LAB — BAYER DCA HB A1C WAIVED: HB A1C (BAYER DCA - WAIVED): 6.9 % — ABNORMAL HIGH (ref 4.8–5.6)

## 2021-09-01 MED ORDER — TRAMADOL HCL 50 MG PO TABS: 50.0000 mg | ORAL_TABLET | Freq: Every day | ORAL | 2 refills | Status: DC

## 2021-09-01 NOTE — Progress Notes (Signed)
? ?Subjective:  ? ? Patient ID: Matthew Pennington, male    DOB: March 14, 1966, 56 y.o.   MRN: [id number] ? ?Chief Complaint  ?Patient presents with  ? Medical Management of Chronic Issues  ?  Wants ear checked seen JE  ? ?Pt presents to the office today for chronic follow up. He is followed by Neurologists for Trigeminal neuralgia. She has given him oxycodone as needed for flare ups. He only takes it 3-4 times a year.   ?  ?He does take Ultram as needed for chronic back pain. He only takes this 1-2 times a month. He never mixes the two.  ?  ?He is currently going to chiropractor once a week for back and shoulder pain.  ?  ?He has OSA and uses CPAP nightly.  ? ?He is having right hand weakness and is followed by Ortho.  ? ?He is having a great deal of heel pain. States he has a cyst on his heel for the last few months that is worsening.  ?Hypertension ?This is a chronic problem. The current episode started more than 1 year ago. The problem has been resolved since onset. The problem is controlled. Associated symptoms include anxiety and malaise/fatigue. Pertinent negatives include no blurred vision, peripheral edema or shortness of breath. Risk factors for coronary artery disease include dyslipidemia, obesity and male gender. The current treatment provides moderate improvement.  ?Gastroesophageal Reflux ?He complains of belching and heartburn. This is a chronic problem. The current episode started more than 1 year ago. The problem occurs occasionally. Associated symptoms include fatigue. Risk factors include obesity. He has tried a PPI for the symptoms. The treatment provided moderate relief.  ?Hyperlipidemia ?This is a chronic problem. The current episode started more than 1 year ago. The problem is controlled. Recent lipid tests were reviewed and are normal. Exacerbating diseases include obesity. Pertinent negatives include no shortness of breath. Current antihyperlipidemic treatment includes diet change. The current  treatment provides mild improvement of lipids. Risk factors for coronary artery disease include dyslipidemia, diabetes mellitus, male sex, hypertension and a sedentary lifestyle.  ?Diabetes ?He presents for his follow-up diabetic visit. He has type 2 diabetes mellitus. Hypoglycemia symptoms include nervousness/anxiousness. Associated symptoms include fatigue and foot paresthesias. Pertinent negatives for diabetes include no blurred vision. Symptoms are stable. Diabetic complications include peripheral neuropathy. Pertinent negatives for diabetic complications include no heart disease. Risk factors for coronary artery disease include dyslipidemia, diabetes mellitus, hypertension, male sex and sedentary lifestyle. He is following a generally unhealthy diet. His overall blood glucose range is 110-130 mg/dl.  ?Arthritis ?Presents for follow-up visit. He complains of pain and stiffness. The symptoms have been worsening. Affected locations include the right wrist, left wrist, left knee, right knee, left hip, right hip, right MCP and left MCP (back). Associated symptoms include fatigue.  ?Depression ?       This is a chronic problem.  The current episode started more than 1 year ago.   The onset quality is gradual.   The problem occurs intermittently.  Associated symptoms include fatigue, helplessness, hopelessness, irritable, restlessness, decreased interest and sad.  Past medical history includes anxiety.   ?Anxiety ?Presents for follow-up visit. Symptoms include depressed mood, excessive worry, irritability, nervous/anxious behavior and restlessness. Patient reports no shortness of breath. Symptoms occur most days. The severity of symptoms is moderate.  ? ? ? ? ? ?Review of Systems  ?Constitutional:  Positive for fatigue, irritability and malaise/fatigue.  ?Eyes:  Negative for blurred vision.  ?Respiratory:  Negative for shortness of breath.   ?Gastrointestinal:  Positive for heartburn.  ?Musculoskeletal:  Positive for  arthritis and stiffness.  ?Psychiatric/Behavioral:  Positive for depression. The patient is nervous/anxious.   ?All other systems reviewed and are negative. ? ?   ?Objective:  ? Physical Exam ?Vitals reviewed.  ?Constitutional:   ?   General: He is irritable. He is not in acute distress. ?   Appearance: He is well-developed. He is obese.  ?HENT:  ?   Head: Normocephalic.  ?   Right Ear: Tympanic membrane normal.  ?   Left Ear: Tympanic membrane normal.  ?Eyes:  ?   General:     ?   Right eye: No discharge.     ?   Left eye: No discharge.  ?   Pupils: Pupils are equal, round, and reactive to light.  ?Neck:  ?   Thyroid: No thyromegaly.  ?Cardiovascular:  ?   Rate and Rhythm: Normal rate and regular rhythm.  ?   Heart sounds: Normal heart sounds. No murmur heard. ?Pulmonary:  ?   Effort: Pulmonary effort is normal. No respiratory distress.  ?   Breath sounds: Normal breath sounds. No wheezing.  ?Abdominal:  ?   General: Bowel sounds are normal. There is no distension.  ?   Palpations: Abdomen is soft.  ?   Tenderness: There is no abdominal tenderness.  ?Musculoskeletal:     ?   General: Tenderness present. Normal range of motion.  ?   Cervical back: Normal range of motion and neck supple.  ?     Feet: ? ?Skin: ?   General: Skin is warm and dry.  ?   Findings: No erythema or rash.  ?Neurological:  ?   Mental Status: He is alert and oriented to person, place, and time.  ?   Cranial Nerves: No cranial nerve deficit.  ?   Deep Tendon Reflexes: Reflexes are normal and symmetric.  ?Psychiatric:     ?   Behavior: Behavior normal.     ?   Thought Content: Thought content normal.     ?   Judgment: Judgment normal.  ? ? ? ? ?BP 114/77   Pulse 76   Temp 98.2 ?F (36.8 ?C) (Temporal)   Ht 6\' 1"  (1.854 m)   Wt 288 lb 12.8 oz (131 kg)   BMI 38.10 kg/m?  ? ?   ?Assessment & Plan:  ?Matthew Pennington comes in today with chief complaint of Medical Management of Chronic Issues (Wants ear checked seen JE) ? ? ?Diagnosis and orders  addressed: ? ?1. Hypertension associated with diabetes (HCC) ?- CMP14+EGFR ?- CBC with Differential/Platelet ?- Bayer DCA Hb A1c Waived ? ?2. OSA (obstructive sleep apnea) ?- CMP14+EGFR ?- CBC with Differential/Platelet ? ?3. Gastroesophageal reflux disease, unspecified whether esophagitis present ?- CMP14+EGFR ?- CBC with Differential/Platelet ? ?4. Hyperlipidemia associated with type 2 diabetes mellitus (HCC) ?- CMP14+EGFR ?- CBC with Differential/Platelet ? ?5. Trigeminal neuralgia ?- CMP14+EGFR ?- CBC with Differential/Platelet ?- traMADol (ULTRAM) 50 MG tablet; Take 1 tablet (50 mg total) by mouth daily.  Dispense: 20 tablet; Refill: 2 ? ?6. Controlled substance agreement signed ?- CMP14+EGFR ?- CBC with Differential/Platelet ? ?7. GAD (generalized anxiety disorder) ?- CMP14+EGFR ?- CBC with Differential/Platelet ? ?8. Moderate recurrent major depression (HCC) ?- CMP14+EGFR ?- CBC with Differential/Platelet ? ?9. Ganglion cyst ?Probable ganglion cyst ?- DG Foot Complete Right ?- CMP14+EGFR ?- CBC with Differential/Platelet ? ?10. Chronic bilateral low back pain without sciatica ?-  CMP14+EGFR ?- CBC with Differential/Platelet ?- traMADol (ULTRAM) 50 MG tablet; Take 1 tablet (50 mg total) by mouth daily.  Dispense: 20 tablet; Refill: 2 ? ?11. Type 2 diabetes mellitus with other specified complication, without long-term current use of insulin (HCC) ? ? ?Labs pending ?Health Maintenance reviewed ?Diet and exercise encouraged ? ?Follow up plan: ?3 months  ? ? ?[name], FNP ? ? ?

## 2021-09-01 NOTE — Patient Instructions (Signed)
Ganglion Cyst ?A ganglion cyst is a non-cancerous, fluid-filled lump of tissue that occurs near a joint, tendon, or ligament. The cyst grows out of a joint or the lining of a tendon or ligament. Ganglion cysts most often develop in the hand or wrist, but they can also develop in the shoulder, elbow, hip, knee, ankle, or foot. ?Ganglion cysts are ball-shaped or egg-shaped. Their size can range from the size of a pea to larger than a grape. Increased activity may cause the cyst to get bigger because more fluid starts to build up. ?What are the causes? ?The exact cause of this condition is not known, but it may be related to: ?Inflammation or irritation around the joint. ?An injury or tear in the layers of tissue around the joint (joint capsule). ?Repetitive movements or overuse. ?History of acute or repeated injury. ?What increases the risk? ?You are more likely to develop this condition if: ?You are a male. ?You are 19-94 years old. ?What are the signs or symptoms? ?The main symptom of this condition is a lump. It most often appears on the hand or wrist. In many cases, there are no other symptoms, but a cyst can sometimes cause: ?Tingling. ?Pain or tenderness. ?Numbness. ?Weakness or loss of strength in the affected joint. ?Decreased range of motion in the affected area of the body. ?How is this diagnosed? ?Ganglion cysts are usually diagnosed based on a physical exam. Your health care provider will feel the lump and may shine a light next to it. If it is a ganglion cyst, the light will likely shine through it. ?Your health care provider may order an X-ray, ultrasound, MRI, or CT scan to rule out other conditions. ?How is this treated? ?Ganglion cysts often go away on their own without treatment. If you have pain or other symptoms, treatment may be needed. Treatment is also needed if the ganglion cyst limits your movement or if it gets infected. Treatment may include: ?Wearing a brace or splint on your wrist or  finger. ?Taking anti-inflammatory medicine. ?Having fluid drained from the lump with a needle (aspiration). ?Getting an injection of medicine into the joint to decrease inflammation. This may be corticosteroids, ethanol, or hyaluronidase. ?Having surgery to remove the ganglion cyst. ?Placing a pad in your shoe or wearing shoes that will not rub against the cyst if it is on your foot. ?Follow these instructions at home: ?Do not press on the ganglion cyst, poke it with a needle, or hit it. ?Take over-the-counter and prescription medicines only as told by your health care provider. ?If you have a brace or splint: ?Wear it as told by your health care provider. ?Remove it as told by your health care provider. Ask if you need to remove it when you take a shower or a bath. ?Watch your ganglion cyst for any changes. ?Keep all follow-up visits as told by your health care provider. This is important. ?Contact a health care provider if: ?Your ganglion cyst becomes larger or more painful. ?You have pus coming from the lump. ?You have weakness or numbness in the affected area. ?You have a fever or chills. ?Get help right away if: ?You have a fever and have any of these in the cyst area: ?Increased redness. ?Red streaks. ?Swelling. ?Summary ?A ganglion cyst is a non-cancerous, fluid-filled lump that occurs near a joint, tendon, or ligament. ?Ganglion cysts most often develop in the hand or wrist, but they can also develop in the shoulder, elbow, hip, knee, ankle, or foot. ?  Ganglion cysts often go away on their own without treatment. ?This information is not intended to replace advice given to you by your health care provider. Make sure you discuss any questions you have with your health care provider. ?Document Revised: 08/07/2019 Document Reviewed: 08/07/2019 ?Elsevier Patient Education ? [place]. ? ?

## 2021-09-05 ENCOUNTER — Other Ambulatory Visit: Payer: Self-pay | Admitting: Family

## 2021-09-05 DIAGNOSIS — M779 Enthesopathy, unspecified: Secondary | ICD-10-CM

## 2021-09-07 LAB — CBC WITH DIFFERENTIAL/PLATELET
Basophils Absolute: 0.1 10*3/uL (ref 0.0–0.2)
Basos: 1 %
EOS (ABSOLUTE): 0.2 10*3/uL (ref 0.0–0.4)
Eos: 3 %
Hematocrit: 48.8 % (ref 37.5–51.0)
Hemoglobin: 16.6 g/dL (ref 13.0–17.7)
Immature Grans (Abs): 0 10*3/uL (ref 0.0–0.1)
Immature Granulocytes: 0 %
Lymphocytes Absolute: 2.7 10*3/uL (ref 0.7–3.1)
Lymphs: 40 %
MCH: 30.7 pg (ref 26.6–33.0)
MCHC: 34 g/dL (ref 31.5–35.7)
MCV: 90 fL (ref 79–97)
Monocytes Absolute: 0.5 10*3/uL (ref 0.1–0.9)
Monocytes: 7 %
Neutrophils Absolute: 3.2 10*3/uL (ref 1.4–7.0)
Neutrophils: 49 %
Platelets: 300 10*3/uL (ref 150–450)
RBC: 5.41 x10E6/uL (ref 4.14–5.80)
RDW: 13.2 % (ref 11.6–15.4)
WBC: 6.7 10*3/uL (ref 3.4–10.8)

## 2021-09-07 LAB — CMP14+EGFR
ALT: 58 IU/L — ABNORMAL HIGH (ref 0–44)
AST: 34 IU/L (ref 0–40)
Albumin/Globulin Ratio: 2 (ref 1.2–2.2)
Albumin: 4.6 g/dL (ref 3.8–4.9)
Alkaline Phosphatase: 72 IU/L (ref 44–121)
BUN/Creatinine Ratio: 13 (ref 9–20)
BUN: 13 mg/dL (ref 6–24)
Bilirubin Total: 0.2 mg/dL (ref 0.0–1.2)
CO2: 22 mmol/L (ref 20–29)
Calcium: 10.3 mg/dL — ABNORMAL HIGH (ref 8.7–10.2)
Chloride: 101 mmol/L (ref 96–106)
Creatinine, Ser: 1.01 mg/dL (ref 0.76–1.27)
Globulin, Total: 2.3 g/dL (ref 1.5–4.5)
Glucose: 108 mg/dL — ABNORMAL HIGH (ref 70–99)
Potassium: 4.8 mmol/L (ref 3.5–5.2)
Sodium: 138 mmol/L (ref 134–144)
Total Protein: 6.9 g/dL (ref 6.0–8.5)
eGFR: 87 mL/min/{1.73_m2} (ref >59)

## 2021-09-24 ENCOUNTER — Ambulatory Visit: Payer: Medicare Other | Admitting: Podiatry

## 2021-10-01 ENCOUNTER — Ambulatory Visit (INDEPENDENT_AMBULATORY_CARE_PROVIDER_SITE_OTHER): Payer: Medicare Other

## 2021-10-01 ENCOUNTER — Ambulatory Visit: Payer: Medicare Other | Admitting: Podiatry

## 2021-10-01 ENCOUNTER — Encounter: Payer: Self-pay | Admitting: Podiatry

## 2021-10-01 DIAGNOSIS — M7661 Achilles tendinitis, right leg: Secondary | ICD-10-CM

## 2021-10-01 DIAGNOSIS — M79671 Pain in right foot: Secondary | ICD-10-CM

## 2021-10-01 MED ORDER — TRIAMCINOLONE ACETONIDE 10 MG/ML IJ SUSP
10.0000 mg | Freq: Once | INTRAMUSCULAR | Status: AC
  Administered 2021-10-01: 10 mg

## 2021-10-01 NOTE — Progress Notes (Signed)
Subjective:  ? ?Patient ID: Matthew Pennington, male   DOB: 56 y.o.   MRN: [id number]  ? ?HPI ?Patient presents stating he has had a lot of pain in the back of his right Achilles tendon and has had a enlargement in that area for years.  States its been sore with shoe gear and hurts now over the last month also when he walks and tries to be active.  Patient does not smoke and would need to be more active ? ? ?Review of Systems  ?All other systems reviewed and are negative. ? ? ?   ?Objective:  ?Physical Exam ?Vitals and nursing note reviewed.  ?Constitutional:   ?   Appearance: He is well-developed.  ?Pulmonary:  ?   Effort: Pulmonary effort is normal.  ?Musculoskeletal:     ?   General: Normal range of motion.  ?Skin: ?   General: Skin is warm.  ?Neurological:  ?   Mental Status: He is alert.  ?  ?Neurovascular status intact muscle strength found to be adequate range of motion within normal limits.  Patient's posterior right heel on the lateral side is inflamed and there is an enlargement in the area with probable bone spur formation in that area.  There is no damage to the tendon itself with good muscle strength ? ?   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 ?   ?Plan:  ?Reviewed condition and x-ray explaining bone spur formation the chronic nature of this condition and at this point recommended conservative care but I do think given the inflammation injection should be done and I did explain the risk of doing this with the patient and the chances for rupture associated with it.  Patient wants to undergo this understanding this risk and I went ahead did sterile prep injected the lateral tendon insertion 3 mg dexamethasone Kenalog 5 mg Xylocaine I also applied air fracture walker to completely immobilize to reduce the stress on the area hopefully prevent any chance for rupture and also allow it to rest.  Reappoint 4 weeks to reevaluate may eventually need a bone spur removed  discussed his diabetes which is mildly elevated and he is working on ? ?X-ray indicates large posterior spur of the right heel at insertion ?   ? ? ?

## 2021-10-01 NOTE — Patient Instructions (Signed)
Achilles Tendinitis  ?with Rehab ?Achilles tendinitis is a disorder of the Achilles tendon. The Achilles tendon connects the large calf muscles (Gastrocnemius and Soleus) to the heel bone (calcaneus). This tendon is sometimes called the heel cord. It is important for pushing-off and standing on your toes and is important for walking, running, or jumping. Tendinitis is often caused by overuse and repetitive microtrauma. ?SYMPTOMS ?Pain, tenderness, swelling, warmth, and redness may occur over the Achilles tendon even at rest. ?Pain with pushing off, or flexing or extending the ankle. ?Pain that is worsened after or during activity. ?CAUSES  ?Overuse sometimes seen with rapid increase in exercise programs or in sports requiring running and jumping. ?Poor physical conditioning (strength and flexibility or endurance). ?Running sports, especially training running down hills. ?Inadequate warm-up before practice or play or failure to stretch before participation. ?Injury to the tendon. ?PREVENTION  ?Warm up and stretch before practice or competition. ?Allow time for adequate rest and recovery between practices and competition. ?Keep up conditioning. ?Keep up ankle and leg flexibility. ?Improve or keep muscle strength and endurance. ?Improve cardiovascular fitness. ?Use proper technique. ?Use proper equipment (shoes, skates). ?To help prevent recurrence, taping, protective strapping, or an adhesive bandage may be recommended for several weeks after healing is complete. ?PROGNOSIS  ?Recovery may take weeks to several months to heal. ?Longer recovery is expected if symptoms have been prolonged. ?Recovery is usually quicker if the inflammation is due to a direct blow as compared with overuse or sudden strain. ?RELATED COMPLICATIONS  ?Healing time will be prolonged if the condition is not correctly treated. The injury must be given plenty of time to heal. ?Symptoms can reoccur if activity is resumed too soon. ?Untreated,  tendinitis may increase the risk of tendon rupture requiring additional time for recovery and possibly surgery. ?TREATMENT  ?The first treatment consists of rest anti-inflammatory medication, and ice to relieve the pain. ?Stretching and strengthening exercises after resolution of pain will likely help reduce the risk of recurrence. Referral to a physical therapist or athletic trainer for further evaluation and treatment may be helpful. ?A walking boot or cast may be recommended to rest the Achilles tendon. This can help break the cycle of inflammation and microtrauma. ?Arch supports (orthotics) may be prescribed or recommended by your caregiver as an adjunct to therapy and rest. ?Surgery to remove the inflamed tendon lining or degenerated tendon tissue is rarely necessary and has shown less than predictable results. ?MEDICATION  ?Nonsteroidal anti-inflammatory medications, such as aspirin and ibuprofen, may be used for pain and inflammation relief. Do not take within 7 days before surgery. Take these as directed by your caregiver. Contact your caregiver immediately if any bleeding, stomach upset, or signs of allergic reaction occur. Other minor pain relievers, such as acetaminophen, may also be used. ?Pain relievers may be prescribed as necessary by your caregiver. Do not take prescription pain medication for longer than 4 to 7 days. Use only as directed and only as much as you need. ?Cortisone injections are rarely indicated. Cortisone injections may weaken tendons and predispose to rupture. It is better to give the condition more time to heal than to use them. ?HEAT AND COLD ?Cold is used to relieve pain and reduce inflammation for acute and chronic Achilles tendinitis. Cold should be applied for 10 to 15 minutes every 2 to 3 hours for inflammation and pain and immediately after any activity that aggravates your symptoms. Use ice packs or an ice massage. ?Heat may be used before performing stretching  and  strengthening activities prescribed by your caregiver. Use a heat pack or a warm soak. ?SEEK MEDICAL CARE IF: ?Symptoms get worse or do not improve in 2 weeks despite treatment. ?New, unexplained symptoms develop. Drugs used in treatment may produce side effects. ? ?EXERCISES: ? ?RANGE OF MOTION (ROM) AND STRETCHING EXERCISES - Achilles Tendinitis  ?These exercises may help you when beginning to rehabilitate your injury. Your symptoms may resolve with or without further involvement from your physician, physical therapist or athletic trainer. While completing these exercises, remember:  ?Restoring tissue flexibility helps normal motion to return to the joints. This allows healthier, less painful movement and activity. ?An effective stretch should be held for at least 30 seconds. ?A stretch should never be painful. You should only feel a gentle lengthening or release in the stretched tissue. ? ?STRETCH  Gastroc, Standing  ?Place hands on wall. ?Extend right / left leg, keeping the front knee somewhat bent. ?Slightly point your toes inward on your back foot. ?Keeping your right / left heel on the floor and your knee straight, shift your weight toward the wall, not allowing your back to arch. ?You should feel a gentle stretch in the right / left calf. Hold this position for 10 seconds. ?Repeat 3 times. Complete this stretch 2 times per day. ? ?STRETCH  Soleus, Standing  ?Place hands on wall. ?Extend right / left leg, keeping the other knee somewhat bent. ?Slightly point your toes inward on your back foot. ?Keep your right / left heel on the floor, bend your back knee, and slightly shift your weight over the back leg so that you feel a gentle stretch deep in your back calf. ?Hold this position for 10 seconds. ?Repeat 3 times. Complete this stretch 2 times per day. ? ?Google, Standing  ?Note: This exercise can place a lot of stress on your foot and ankle. Please complete this exercise only if specifically  instructed by your caregiver.  ?Place the ball of your right / left foot on a step, keeping your other foot firmly on the same step. ?Hold on to the wall or a rail for balance. ?Slowly lift your other foot, allowing your body weight to press your heel down over the edge of the step. ?You should feel a stretch in your right / left calf. ?Hold this position for 10 seconds. ?Repeat this exercise with a slight bend in your knee. ?Repeat 3 times. Complete this stretch 2 times per day.  ? ?STRENGTHENING EXERCISES - Achilles Tendinitis ?These exercises may help you when beginning to rehabilitate your injury. They may resolve your symptoms with or without further involvement from your physician, physical therapist or athletic trainer. While completing these exercises, remember:  ?Muscles can gain both the endurance and the strength needed for everyday activities through controlled exercises. ?Complete these exercises as instructed by your physician, physical therapist or athletic trainer. Progress the resistance and repetitions only as guided. ?You may experience muscle soreness or fatigue, but the pain or discomfort you are trying to eliminate should never worsen during these exercises. If this pain does worsen, stop and make certain you are following the directions exactly. If the pain is still present after adjustments, discontinue the exercise until you can discuss the trouble with your clinician. ? ?STRENGTH - Plantar-flexors  ?Sit with your right / left leg extended. Holding onto both ends of a rubber exercise band/tubing, loop it around the ball of your foot. Keep a slight tension in the band. ?Slowly  push your toes away from you, pointing them downward. ?Hold this position for 10 seconds. Return slowly, controlling the tension in the band/tubing. ?Repeat 3 times. Complete this exercise 2 times per day.  ? ?STRENGTH - Plantar-flexors  ?Stand with your feet shoulder width apart. Steady yourself with a wall or table  using as little support as needed. ?Keeping your weight evenly spread over the width of your feet, rise up on your toes.* ?Hold this position for 10 seconds. ?Repeat 3 times. Complete this exercise 2 times per day.  ?

## 2021-10-04 DIAGNOSIS — G4733 Obstructive sleep apnea (adult) (pediatric): Secondary | ICD-10-CM | POA: Diagnosis not present

## 2021-10-08 ENCOUNTER — Telehealth: Payer: Self-pay | Admitting: Pharmacist

## 2021-10-08 ENCOUNTER — Encounter: Payer: Self-pay | Admitting: Nurse Practitioner

## 2021-10-08 ENCOUNTER — Ambulatory Visit (INDEPENDENT_AMBULATORY_CARE_PROVIDER_SITE_OTHER): Payer: Medicare Other | Admitting: Nurse Practitioner

## 2021-10-08 VITALS — BP 117/72 | HR 86 | Ht 73.0 in | Wt 286.4 lb

## 2021-10-08 DIAGNOSIS — B3749 Other urogenital candidiasis: Secondary | ICD-10-CM

## 2021-10-08 DIAGNOSIS — E1169 Type 2 diabetes mellitus with other specified complication: Secondary | ICD-10-CM

## 2021-10-08 MED ORDER — MICONAZOLE NITRATE 2 % EX CREA: 1.0000 "application " | TOPICAL_CREAM | Freq: Two times a day (BID) | CUTANEOUS | 0 refills | Status: DC

## 2021-10-08 MED ORDER — FLUCONAZOLE 150 MG PO TABS: 150.0000 mg | ORAL_TABLET | Freq: Once | ORAL | 0 refills | Status: AC

## 2021-10-08 NOTE — Patient Instructions (Signed)
Genital Yeast Infection, Male ?In men, a genital yeast infection is a condition that causes soreness, swelling, and redness (inflammation) of the head of the penis (glans penis). A genital yeast infection can be spread through sexual contact, but it can also develop without sexual contact. If the infection is not treated properly, it is likely to come back. ?What are the causes? ?This condition is caused by a change in the normal balance of the yeast and bacteria that live on the skin. This change causes an overgrowth of yeast, which causes the inflammation. Many types of yeast can cause this infection, but Candida is the most common. ?What increases the risk? ?The following factors may make you more likely to develop this condition: ?Taking antibiotic medicines. ?Having diabetes. ?Being exposed to the infection by a sexual partner. ?Being uncircumcised. ?Having a weak body defense system (immune system). ?Taking steroid medicines for a long time. ?Having poor hygiene. ?What are the signs or symptoms? ?Symptoms of this condition include: ?Itching of the groin and penis. ?Dry, red, or cracked skin on the penis. ?Swelling of the genital area. ?Pain while urinating or trouble urinating. ?Thick, bad-smelling discharge on the penis. ?How is this diagnosed? ?This condition may be diagnosed based on: ?Your medical history. ?A physical exam. ?You may also have tests, such as: ?Test of a sample of discharge from the penis. ?Urine tests. ?Blood tests. ?How is this treated? ?This condition is treated with: ?Antifungal creams or medicines. Antifungal medicines may be prescribed by your health care provider or they may be available over the counter. ?Self-care at home. ?For men who are not circumcised, circumcision may be recommended to control infections that return and are difficult to treat. ?Follow these instructions at home: ?Medicines ? ?Take or apply over-the-counter and prescription medicines only as told by your health  care provider. ?Take your antifungal medicine as told by your health care provider. Do not stop taking the medicine even if you start to feel better. ?Self-care ?Wash your penis with soap and water every day. If you are not circumcised, pull back the foreskin to wash. Make sure to dry your penis completely after washing. ?Take sitz baths as told by your health care provider. This may help ease swelling and redness. ?Wear breathable, cotton underwear. ?Keep your underwear clean and dry. ?General instructions ?Do not have sex until your health care provider has approved. Tell your sexual partner that you have a yeast infection. That person should go for treatment even if no symptoms are present. ?If you have diabetes, keep your blood sugar levels within your target range. ?Keep all follow-up visits. This is important. ?Contact a health care provider if: ?You have a fever. ?You have symptoms that go away and then return. ?You do not get better with treatment. ?You have symptoms that get worse. ?You have new symptoms. ?Get help right away if: ?Your swelling and inflammation become so severe that you cannot urinate. ?Summary ?In men, a genital yeast infection is a condition that causes soreness, swelling, and redness (inflammation) of the head of the penis (glans penis). ?This condition is caused by a change in the normal balance of the yeast and bacteria that live on the skin. This change causes an overgrowth of yeast, which causes the inflammation. ?A genital yeast infection usually spreads through sexual contact, but it can develop without sexual contact. For instance, you may be more likely to develop this infection if you take antibiotics or steroids, have diabetes, are not circumcised, have a  weak immune system, or have poor hygiene. ?This condition is treated with antifungal cream or pills along with self-care at home. ?This information is not intended to replace advice given to you by your health care provider.  Make sure you discuss any questions you have with your health care provider. ?Document Revised: 05/12/2021 Document Reviewed: 05/12/2021 ?Elsevier Patient Education ? [place]. ? ?

## 2021-10-08 NOTE — Telephone Encounter (Signed)
Patient seen by Ardeen Fillers today ?Requested jardiance '25mg'$  samples ?May benefit from CCM pharmd referral is having difficulty with copay ?Max income $65k for household to qualify for patient assistance ?Would put in CCM referral if needed--check with patient  ?

## 2021-10-08 NOTE — Progress Notes (Signed)
? ?Acute Office Visit ? ?Subjective:  ? ?  ?Patient ID: Matthew Pennington, male    DOB: [birth year], 56 y.o.   MRN: [id number] ? ?Chief Complaint  ?Patient presents with  ? Rash  ?  On the head of penis , been going on for about 2 weeks - did try a new soap a few weeks ago   ? ? ?Rash ?This is a new problem. The current episode started in the past 7 days. The problem has been gradually worsening since onset. Location: shaft of penis. The rash is characterized by itchiness and redness. He was exposed to nothing. Pertinent negatives include no congestion, cough, fatigue or fever. Past treatments include anti-itch cream (topical antifungal). The treatment provided no relief.  ? ? ?Review of Systems  ?Constitutional: Negative.  Negative for chills, fatigue and fever.  ?HENT:  Negative for congestion.   ?Respiratory:  Negative for cough.   ?Cardiovascular: Negative.   ?Skin:  Positive for rash.  ?Neurological: Negative.   ?All other systems reviewed and are negative. ? ? ?   ?Objective:  ?  ?BP 117/72   Pulse 86   Ht 6\' 1"  (1.854 m)   Wt 286 lb 6.4 oz (129.9 kg)   SpO2 93%   BMI 37.79 kg/m?  ?BP Readings from Last 3 Encounters:  ?10/08/21 117/72  ?09/01/21 114/77  ?08/25/21 109/65  ? ?Wt Readings from Last 3 Encounters:  ?10/08/21 286 lb 6.4 oz (129.9 kg)  ?09/01/21 288 lb 12.8 oz (131 kg)  ?08/25/21 287 lb (130.2 kg)  ? ?  ? ?Physical Exam ?Vitals and nursing note reviewed.  ?Constitutional:   ?   Appearance: Normal appearance. He is obese.  ?HENT:  ?   Head: Normocephalic.  ?   Right Ear: External ear normal.  ?   Left Ear: External ear normal.  ?   Nose: Nose normal.  ?   Mouth/Throat:  ?   Mouth: Mucous membranes are moist.  ?   Pharynx: Oropharynx is clear.  ?Eyes:  ?   Conjunctiva/sclera: Conjunctivae normal.  ?Cardiovascular:  ?   Rate and Rhythm: Normal rate and regular rhythm.  ?   Pulses: Normal pulses.  ?   Heart sounds: Normal heart sounds.  ?Pulmonary:  ?   Effort: Pulmonary effort is normal.  ?   Breath  sounds: Normal breath sounds.  ?Abdominal:  ?   General: Bowel sounds are normal.  ?Skin: ?   Findings: Erythema and rash present.  ?Neurological:  ?   General: No focal deficit present.  ?   Mental Status: He is alert and oriented to person, place, and time.  ?Psychiatric:     ?   Behavior: Behavior normal.  ? ? ?No results found for any visits on 10/08/21. ? ? ?   ?Assessment & Plan:  ?Keep skin clean and dry avoiding moisture ?-Use medication as prescribed ?-Education provided with printed handouts given ?Problem List Items Addressed This Visit   ?None ?Visit Diagnoses   ? ? Yeast dermatitis of penis    -  Primary  ? Relevant Medications  ? fluconazole (DIFLUCAN) 150 MG tablet  ? miconazole (MICATIN) 2 % cream  ? ?  ? ? ?Meds ordered this encounter  ?Medications  ? fluconazole (DIFLUCAN) 150 MG tablet  ?  Sig: Take 1 tablet (150 mg total) by mouth once for 1 dose.  ?  Dispense:  1 tablet  ?  Refill:  0  ?  Order Specific Question:  Supervising Provider  ?  AnswerMechele Claude [[id number]]  ? miconazole (MICATIN) 2 % cream  ?  Sig: Apply 1 application. topically 2 (two) times daily.  ?  Dispense:  28.35 g  ?  Refill:  0  ?  Order Specific Question:   Supervising Provider  ?  AnswerMechele Claude [[id number]]  ? ? ?Return if symptoms worsen or fail to improve. ? ?Daryll Drown, NP ? ? ?

## 2021-10-11 NOTE — Telephone Encounter (Signed)
Referral placed today.  ? ?Evelina Dun, FNP ? ?

## 2021-10-12 ENCOUNTER — Telehealth: Payer: Self-pay

## 2021-10-12 ENCOUNTER — Telehealth: Payer: Self-pay | Admitting: Family

## 2021-10-12 MED ORDER — FLUCONAZOLE 150 MG PO TABS: 150.0000 mg | ORAL_TABLET | ORAL | 0 refills | Status: DC | PRN

## 2021-10-12 MED ORDER — KETOCONAZOLE 2 % EX CREA: 1.0000 "application " | TOPICAL_CREAM | Freq: Every day | CUTANEOUS | 0 refills | Status: DC

## 2021-10-12 NOTE — Telephone Encounter (Signed)
Diflucan Prescription sent to pharmacy and ketoconazole rx.  ?

## 2021-10-12 NOTE — Telephone Encounter (Signed)
Patient was given a cream on 5/12 for a yeast infection. Matthew Pennington he thinks that he had an allergic reaction, stopped using the cream but wants to know if something else can be called in. Please call back and advise.  ?

## 2021-10-12 NOTE — Telephone Encounter (Signed)
Pt aware provider sent in meds. ?

## 2021-10-12 NOTE — Chronic Care Management (AMB) (Signed)
?  Care Management  ? ?Note ? ?10/12/2021 ?Name: Alesandro Petithomme MRN: [id number] DOB: Jan 23, 1966 ? ?Camdynn Aldis is a 56 y.o. year old male who is a primary care patient of Junie Spencer, FNP. I reached out to Lisa Roca by phone today offer care coordination services.  ? ?Mr. Karpf was given information about care management services today including:  ?Care management services include personalized support from designated clinical staff supervised by his physician, including individualized plan of care and coordination with other care providers ?24/7 contact phone numbers for assistance for urgent and routine care needs. ?The patient may stop care management services at any time by phone call to the office staff. ? ?Patient did not agree to enrollment in care management services and does not wish to consider at this time. ? ?Follow up plan: ?Patient declines further follow up and engagement by the care management team. Appropriate care team members and provider have been notified via electronic communication.  ? ?Penne Lash, RMA ?Care Guide, Embedded Care Coordination ?[facility]  ?[place], [place] [address] ?Direct Dial: [phone] ?[email or web address] ?[email or web address]  ? ?

## 2021-10-13 ENCOUNTER — Other Ambulatory Visit: Payer: Self-pay | Admitting: Podiatry

## 2021-10-13 DIAGNOSIS — M7661 Achilles tendinitis, right leg: Secondary | ICD-10-CM

## 2021-10-15 DIAGNOSIS — Z888 Allergy status to other drugs, medicaments and biological substances status: Secondary | ICD-10-CM | POA: Diagnosis not present

## 2021-10-15 DIAGNOSIS — Z885 Allergy status to narcotic agent status: Secondary | ICD-10-CM | POA: Diagnosis not present

## 2021-10-15 DIAGNOSIS — Z20822 Contact with and (suspected) exposure to covid-19: Secondary | ICD-10-CM | POA: Diagnosis not present

## 2021-10-15 DIAGNOSIS — J029 Acute pharyngitis, unspecified: Secondary | ICD-10-CM | POA: Diagnosis not present

## 2021-10-15 DIAGNOSIS — G4730 Sleep apnea, unspecified: Secondary | ICD-10-CM | POA: Diagnosis not present

## 2021-10-15 DIAGNOSIS — J028 Acute pharyngitis due to other specified organisms: Secondary | ICD-10-CM | POA: Diagnosis not present

## 2021-10-15 DIAGNOSIS — B9789 Other viral agents as the cause of diseases classified elsewhere: Secondary | ICD-10-CM | POA: Diagnosis not present

## 2021-10-19 ENCOUNTER — Ambulatory Visit (INDEPENDENT_AMBULATORY_CARE_PROVIDER_SITE_OTHER): Payer: Medicare Other | Admitting: Nurse Practitioner

## 2021-10-19 ENCOUNTER — Encounter: Payer: Self-pay | Admitting: Nurse Practitioner

## 2021-10-19 VITALS — BP 117/73 | HR 62 | Temp 98.2°F | Wt 289.6 lb

## 2021-10-19 DIAGNOSIS — Z7984 Long term (current) use of oral hypoglycemic drugs: Secondary | ICD-10-CM | POA: Diagnosis not present

## 2021-10-19 DIAGNOSIS — E0800 Diabetes mellitus due to underlying condition with hyperosmolarity without nonketotic hyperglycemic-hyperosmolar coma (NKHHC): Secondary | ICD-10-CM

## 2021-10-19 DIAGNOSIS — E119 Type 2 diabetes mellitus without complications: Secondary | ICD-10-CM | POA: Diagnosis not present

## 2021-10-19 DIAGNOSIS — Z885 Allergy status to narcotic agent status: Secondary | ICD-10-CM | POA: Diagnosis not present

## 2021-10-19 DIAGNOSIS — K219 Gastro-esophageal reflux disease without esophagitis: Secondary | ICD-10-CM | POA: Diagnosis not present

## 2021-10-19 DIAGNOSIS — B370 Candidal stomatitis: Secondary | ICD-10-CM

## 2021-10-19 DIAGNOSIS — K30 Functional dyspepsia: Secondary | ICD-10-CM | POA: Diagnosis not present

## 2021-10-19 DIAGNOSIS — R21 Rash and other nonspecific skin eruption: Secondary | ICD-10-CM | POA: Diagnosis not present

## 2021-10-19 DIAGNOSIS — R197 Diarrhea, unspecified: Secondary | ICD-10-CM | POA: Diagnosis not present

## 2021-10-19 DIAGNOSIS — R079 Chest pain, unspecified: Secondary | ICD-10-CM | POA: Diagnosis not present

## 2021-10-19 DIAGNOSIS — E1169 Type 2 diabetes mellitus with other specified complication: Secondary | ICD-10-CM | POA: Diagnosis not present

## 2021-10-19 DIAGNOSIS — B3781 Candidal esophagitis: Secondary | ICD-10-CM | POA: Diagnosis not present

## 2021-10-19 DIAGNOSIS — G4733 Obstructive sleep apnea (adult) (pediatric): Secondary | ICD-10-CM | POA: Diagnosis not present

## 2021-10-19 MED ORDER — NYSTATIN 100000 UNIT/ML MT SUSP: 5.0000 mL | Freq: Four times a day (QID) | OROMUCOSAL | 0 refills | Status: DC

## 2021-10-19 MED ORDER — LIDOCAINE VISCOUS HCL 2 % MT SOLN: 15.0000 mL | OROMUCOSAL | 1 refills | Status: DC | PRN

## 2021-10-19 NOTE — Assessment & Plan Note (Signed)
Patient is not taking his Protonix on a daily basis.  Advised patient to take Protonix 40 mg tablet by mouth daily.  Follow-up with unresolved symptoms.

## 2021-10-19 NOTE — Progress Notes (Signed)
Established Patient Office Visit  Subjective   Patient ID: Matthew Pennington, male    DOB: 1966/05/09  Age: 56 y.o. MRN: [id number]  Chief Complaint  Patient presents with   Thrush    HPI   Thrush: Patient here for evaluation of evaluation of white spots in mouth. Onset of symptoms was a few days ago, gradually improving since that time. Feeding Method:  He is drinking plenty of fluids   GERD, Follow up:  The patient was last seen for GERD few  days ago. Changes made since that visit include. Continue Protonix 40 mg daily  He reports good compliance with treatment. He is not having side effects. Marland Kitchen  He IS experiencing belching, bilious reflux, chest pain, and choking on food. He is NOT experiencing dysphagia, early satiety, or fullness after meals  .   Patient Active Problem List   Diagnosis Date Noted   Arthritis of carpometacarpal [facility]) joint of right thumb 06/08/2021   Hepatic steatosis 12/24/2019   Gastroesophageal reflux disease 08/23/2019   Controlled substance agreement signed 08/23/2019   Moderate recurrent major depression (HCC) 08/23/2019   OSA (obstructive sleep apnea) 12/05/2017   GAD (generalized anxiety disorder) 11/24/2017   Leg cramps, sleep related 11/24/2017   Hypertension associated with diabetes (HCC) 03/29/2017   Diabetes mellitus (HCC) 03/29/2017   Hyperlipidemia associated with type 2 diabetes mellitus (HCC) 03/29/2017   Diverticulosis of large intestine without hemorrhage 03/29/2017   History of prostatitis 03/29/2017   Light headedness    Tingling    Weakness    Trigeminal neuralgia    Past Medical History:  Diagnosis Date   Diabetes mellitus without complication (HCC)    H/O echocardiogram 09/2017   normal EF   Headache    Hyperlipidemia    Hypertension    Light headedness    OSA on CPAP    Palpitations    Tingling    toes   Trigeminal neuralgia    Past Surgical History:  Procedure Laterality Date   NO PAST SURGERIES     Social  History   Tobacco Use   Smoking status: Never   Smokeless tobacco: Never  Vaping Use   Vaping Use: Never used  Substance Use Topics   Alcohol use: No   Drug use: No   Social History   Socioeconomic History   Marital status: Married    Spouse name: Tracie   Number of children: 3   Years of education: Not on file   Highest education level: Associate degree: academic program  Occupational History   Not on file  Tobacco Use   Smoking status: Never   Smokeless tobacco: Never  Vaping Use   Vaping Use: Never used  Substance and Sexual Activity   Alcohol use: No   Drug use: No   Sexual activity: Not on file  Other Topics Concern   Not on file  Social History Narrative   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   Social Determinants of Health   Financial Resource Strain: Low Risk    Difficulty of Paying Living Expenses: Not hard at all  Food Insecurity: No Food Insecurity   Worried About Programme researcher, broadcasting/film/video in the Last Year: Never true   Barista in the Last Year: Never true  Transportation Needs: Unmet Transportation Needs   Lack of Transportation (Medical): Yes   Lack of Transportation (Non-Medical): Yes  Physical Activity: Insufficiently Active   Days of Exercise per Week: 2 days   Minutes of Exercise per Session: 10 min  Stress: No Stress Concern Present   Feeling of Stress : Only a little  Social Connections: Press photographer of Communication with Friends and Family: More than three times a week   Frequency of Social Gatherings with Friends and Family: More than three times a week   Attends Religious Services: More than 4 times per year   Active Member of [employer] or Organizations: Yes   Attends Engineer, structural: More than 4 times per year   Marital Status: Married  Catering manager Violence: Not At Risk   Fear of Current or Ex-Partner: No   Emotionally Abused: No    Physically Abused: No   Sexually Abused: No   Family Status  Relation Name Status   Mother  Deceased   Father  Deceased   Sister  Alive   Brother  Alive   Family History  Problem Relation Age of Onset   Diabetes Mother    Hyperlipidemia Mother    Hypertension Mother    Lung cancer Father    Heart disease Sister    Allergies  Allergen Reactions   Morphine And Related Other (See Comments)    "Makes me go Crazy"   Statins Other (See Comments)    Joint/mucles pains      Review of Systems  Constitutional: Negative.   HENT: Negative.    Eyes: Negative.   Respiratory: Negative.    Cardiovascular: Negative.   Gastrointestinal:  Positive for heartburn. Negative for nausea and vomiting.  Genitourinary: Negative.   Musculoskeletal: Negative.   Skin: Negative.  Negative for rash.  All other systems reviewed and are negative.    Objective:     BP 117/73   Pulse 62   Temp 98.2 F (36.8 C)   Wt 289 lb 9.6 oz (131.4 kg)   SpO2 94%   BMI 38.21 kg/m  BP Readings from Last 3 Encounters:  10/19/21 117/73  10/08/21 117/72  09/01/21 114/77   Wt Readings from Last 3 Encounters:  10/19/21 289 lb 9.6 oz (131.4 kg)  10/08/21 286 lb 6.4 oz (129.9 kg)  09/01/21 288 lb 12.8 oz (131 kg)      Physical Exam Vitals and nursing note reviewed.  Constitutional:      Appearance: Normal appearance.  HENT:     Head: Normocephalic.     Right Ear: External ear normal.     Left Ear: External ear normal.     Nose: Nose normal.     Mouth/Throat:     Lips: Pink.     Mouth: Mucous membranes are moist. No oral lesions.     Pharynx: Oropharynx is clear.     Comments: Oral thrush resolving.  Eyes:     Conjunctiva/sclera: Conjunctivae normal.  Cardiovascular:     Rate and Rhythm: Normal rate and regular rhythm.     Pulses: Normal pulses.     Heart sounds: Normal heart sounds.  Pulmonary:     Effort: Pulmonary effort is normal.     Breath sounds: Normal breath sounds.  Abdominal:      General: Bowel sounds are normal.  Musculoskeletal:        General: Normal range of motion.  Neurological:     General: No focal deficit present.     Mental Status: He is alert and oriented to person, place, and time.  Psychiatric:  Mood and Affect: Mood normal.        Behavior: Behavior normal.     No results found for any visits on 10/19/21.  Last CBC Lab Results  Component Value Date   WBC 6.7 09/01/2021   HGB 16.6 09/01/2021   HCT 48.8 09/01/2021   MCV 90 09/01/2021   MCH 30.7 09/01/2021   RDW 13.2 09/01/2021   PLT 300 09/01/2021   Last metabolic panel Lab Results  Component Value Date   GLUCOSE 108 (H) 09/01/2021   NA 138 09/01/2021   K 4.8 09/01/2021   CL 101 09/01/2021   CO2 22 09/01/2021   BUN 13 09/01/2021   CREATININE 1.01 09/01/2021   EGFR 87 09/01/2021   CALCIUM 10.3 (H) 09/01/2021   PROT 6.9 09/01/2021   ALBUMIN 4.6 09/01/2021   LABGLOB 2.3 09/01/2021   AGRATIO 2.0 09/01/2021   BILITOT 0.2 09/01/2021   ALKPHOS 72 09/01/2021   AST 34 09/01/2021   ALT 58 (H) 09/01/2021   ANIONGAP 10 07/07/2018   Last lipids Lab Results  Component Value Date   CHOL 204 (H) 08/23/2019   HDL 31 (L) 08/23/2019   LDLCALC 120 (H) 08/23/2019   TRIG 301 (H) 08/23/2019   CHOLHDL 6.6 (H) 08/23/2019   Last hemoglobin A1c Lab Results  Component Value Date   HGBA1C 6.9 (H) 09/01/2021      The 10-year ASCVD risk score (Arnett DK, et al., 2019) is: 17.2%    Assessment & Plan:  Take medication as prescribed, -Oral thrush resolving after few days of clotrimazole pill given to patient in emergency department. -Nystatin oral suspension  Follow-up with worsening unresolved symptoms. Problem List Items Addressed This Visit       Digestive   Gastroesophageal reflux disease    Patient is not taking his Protonix on a daily basis.  Advised patient to take Protonix 40 mg tablet by mouth daily.  Follow-up with unresolved symptoms.         Endocrine    Diabetes mellitus (HCC)    Patient discontinued Jardiance and doubled up on his metformin in the past 2 weeks, patient is currently taking 2000 mg of metformin daily divided into 2 doses. I advised patient to make a follow up appointment with PCP to discuss changes and possible new medication and rechecking A1c. At home blood glucose before metformin 140-150       Other Visit Diagnoses     Thrush, oral    -  Primary   Relevant Medications   nystatin (MYCOSTATIN) 100000 UNIT/ML suspension       Return if symptoms worsen or fail to improve.    Daryll Drown, NP

## 2021-10-19 NOTE — Patient Instructions (Signed)
Oral Thrush, Adult Oral thrush is an infection in your mouth and throat and on your tongue. It causes white patches to form in your mouth and on your tongue. Many cases of thrush are mild. But, sometimes, thrush can be serious. People who have a weak body defense system (immune system) or other diseases can be affected more. What are the causes? This condition is caused by a type of fungus called yeast. The fungus is normally present in small amounts in the mouth and nose. If a person has a long-term illness or a weak body defense system, the fungus can grow and spread quickly. This causes thrush. What increases the risk? You are more likely to develop this condition if: You have a weak body defense system. You are an older adult. You have diabetes, cancer, or HIV. You have a dry mouth. You are pregnant or breastfeeding. You do not take good care of your teeth. This risk is greater for people who have false teeth (dentures). You use antibiotic or steroid medicines. What are the signs or symptoms? Symptoms of this condition include: A burning feeling in the mouth and throat. White patches that stick to the mouth and tongue. A bad taste in the mouth or trouble tasting foods. A feeling like you have cotton in your mouth. Pain when you eat and swallow. Not wanting to eat as much as usual. Cracking at the corners of the mouth. How is this treated? This condition is treated with medicines called antifungals. These medicines prevent a fungus from growing. The medicines are either put right on the area (topical) or swallowed (oral). Your doctor will also treat other problems that you may have, such as diabetes or HIV. Follow these instructions at home: Helping with pain and soreness To lessen your pain: Drink cold liquids, like water and iced tea. Eat frozen ice pops or frozen juices. Eat foods that are easy to swallow, like gelatin and ice cream. Drink from a straw if you have too much pain  in your mouth.  General instructions Take or use over-the-counter and prescription medicines only as told by your doctor. Eat plain yogurt that has live cultures in it. Read the label to make sure that there are live cultures in your yogurt. If you wear false teeth: Take them out before you go to bed. Brush them well. Soak them in a cleaner. Rinse your mouth with warm salt-water many times a day. To make the salt-water mixture, dissolve -1 teaspoon (3-6 g) of salt in 1 cup (237 mL) of warm water. Contact a doctor if: Your problems do not get better within 7 days of treatment. Your infection is spreading. This may show as white areas on the skin outside of your mouth. You are nursing your baby and you have redness and pain in the nipples. Summary Oral thrush is an infection in your mouth and throat. It is caused by a fungus. You are more likely to get this condition if you have a weak body defense system. Diseases like diabetes, cancer, or HIV also add to your risk. This condition is treated with medicines called antifungals. Contact a doctor if you do not get better within 7 days of starting treatment. This information is not intended to replace advice given to you by your health care provider. Make sure you discuss any questions you have with your health care provider. Document Revised: 01/13/2021 Document Reviewed: 03/22/2019 Elsevier Patient Education  2023 Elsevier Inc.  

## 2021-10-19 NOTE — Assessment & Plan Note (Signed)
Patient discontinued Jardiance and doubled up on his metformin in the past 2 weeks, patient is currently taking 2000 mg of metformin daily divided into 2 doses. I advised patient to make a follow up appointment with PCP to discuss changes and possible new medication and rechecking A1c. At home blood glucose before metformin 140-150

## 2021-10-21 ENCOUNTER — Encounter: Payer: Self-pay | Admitting: Family

## 2021-10-21 ENCOUNTER — Telehealth (INDEPENDENT_AMBULATORY_CARE_PROVIDER_SITE_OTHER): Payer: Medicare Other | Admitting: Family

## 2021-10-21 ENCOUNTER — Telehealth: Payer: Self-pay | Admitting: Family

## 2021-10-21 DIAGNOSIS — E1169 Type 2 diabetes mellitus with other specified complication: Secondary | ICD-10-CM

## 2021-10-21 DIAGNOSIS — H1011 Acute atopic conjunctivitis, right eye: Secondary | ICD-10-CM | POA: Diagnosis not present

## 2021-10-21 DIAGNOSIS — B370 Candidal stomatitis: Secondary | ICD-10-CM

## 2021-10-21 MED ORDER — METFORMIN HCL 500 MG PO TABS: 1000.0000 mg | ORAL_TABLET | Freq: Two times a day (BID) | ORAL | 1 refills | Status: DC

## 2021-10-21 MED ORDER — CETIRIZINE HCL 10 MG PO TABS: 10.0000 mg | ORAL_TABLET | Freq: Every day | ORAL | 1 refills | Status: DC

## 2021-10-21 NOTE — Patient Instructions (Signed)

## 2021-10-21 NOTE — Addendum Note (Signed)
Addended by: Evelina Dun A on: 10/21/2021 04:45 PM   Modules accepted: Orders

## 2021-10-21 NOTE — Progress Notes (Signed)
Virtual Visit Consent   Matthew Pennington, you are scheduled for a virtual visit with a [facility] provider today. Just as with appointments in the office, your consent must be obtained to participate. Your consent will be active for this visit and any virtual visit you may have with one of our providers in the next 365 days. If you have a MyChart account, a copy of this consent can be sent to you electronically.  As this is a virtual visit, video technology does not allow for your provider to perform a traditional examination. This may limit your provider's ability to fully assess your condition. If your provider identifies any concerns that need to be evaluated in person or the need to arrange testing (such as labs, EKG, etc.), we will make arrangements to do so. Although advances in technology are sophisticated, we cannot ensure that it will always work on either your end or our end. If the connection with a video visit is poor, the visit may have to be switched to a telephone visit. With either a video or telephone visit, we are not always able to ensure that we have a secure connection.  By engaging in this virtual visit, you consent to the provision of healthcare and authorize for your insurance to be billed (if applicable) for the services provided during this visit. Depending on your insurance coverage, you may receive a charge related to this service.  I need to obtain your verbal consent now. Are you willing to proceed with your visit today? Matthew Pennington has provided verbal consent on 10/21/2021 for a virtual visit (video or telephone). [name], FNP  Date: 10/21/2021 11:49 AM  Virtual Visit via Video Note   I, [name], connected with  Matthew Pennington  ([id number], Feb 10, 1966) on 10/21/21 at 12:05 PM EDT by a video-enabled telemedicine application and verified that I am speaking with the correct person using two identifiers.  Location: Patient: Virtual Visit Location Patient: Other:  car Provider: Virtual Visit Location Provider: Home Office   I discussed the limitations of evaluation and management by telemedicine and the availability of in person appointments. The patient expressed understanding and agreed to proceed.    History of Present Illness: Matthew Pennington is a 56 y.o. who identifies as a male who was assigned male at birth, and is being seen today for oral thrush in his mouth. He has had yeast in his groin and mouth. He has stopped his Jardiance and increased his metformin to 1000 mg BID from 1000 mg AM and 500 mg in evening. Reports his glucose has been stable.   He was given clotrimazole 10 mg QID for 14 days.    He is complaining of right eye swelling and erythemas.   HPI: Diabetes He has type 2 diabetes mellitus. Pertinent negatives for diabetes include no blurred vision and no foot paresthesias. Risk factors for coronary artery disease include diabetes mellitus, dyslipidemia, male sex, hypertension and sedentary lifestyle. He is following a generally healthy diet. His overall blood glucose range is 130-140 mg/dl.   Problems:  Patient Active Problem List   Diagnosis Date Noted   Arthritis of carpometacarpal [facility]) joint of right thumb 06/08/2021   Hepatic steatosis 12/24/2019   Gastroesophageal reflux disease 08/23/2019   Controlled substance agreement signed 08/23/2019   Moderate recurrent major depression (HCC) 08/23/2019   OSA (obstructive sleep apnea) 12/05/2017   GAD (generalized anxiety disorder) 11/24/2017   Leg cramps, sleep related 11/24/2017   Hypertension associated with diabetes (HCC) 03/29/2017  Diabetes mellitus (HCC) 03/29/2017   Hyperlipidemia associated with type 2 diabetes mellitus (HCC) 03/29/2017   Diverticulosis of large intestine without hemorrhage 03/29/2017   History of prostatitis 03/29/2017   Light headedness    Tingling    Weakness    Trigeminal neuralgia     Allergies:  Allergies  Allergen Reactions   Morphine And  Related Other (See Comments)    "Makes me go Crazy"   Statins Other (See Comments)    Joint/mucles pains   Medications:  Current Outpatient Medications:    Blood Glucose Calibration (ONETOUCH VERIO) High SOLN, , Disp: , Rfl:    Blood Glucose Monitoring Suppl (ONETOUCH VERIO REFLECT) w/Device KIT, , Disp: , Rfl:    enalapril (VASOTEC) 20 MG tablet, Take 1 tablet (20 mg total) by mouth daily., Disp: 90 tablet, Rfl: 1   ezetimibe (ZETIA) 10 MG tablet, Take 1 tablet (10 mg total) by mouth daily., Disp: 90 tablet, Rfl: 1   gabapentin (NEURONTIN) 300 MG capsule, Take 1 capsule (300 mg total) by mouth 3 (three) times daily., Disp: 270 capsule, Rfl: 1   glucose blood (ONETOUCH VERIO) test strip, , Disp: , Rfl:    ibuprofen (ADVIL) 800 MG tablet, Take 1 tablet (800 mg total) by mouth daily., Disp: 90 tablet, Rfl: 5   Lancet Devices (SIMPLE DIAGNOSTICS LANCING DEV) MISC, Apply topically., Disp: , Rfl:    metFORMIN (GLUCOPHAGE) 500 MG tablet, TAKE 2 TABLETS BY MOUTH  ONCE DAILY WITH BREAKFAST  AND 1 TABLET BY MOUTH IN  THE EVENING, Disp: 270 tablet, Rfl: 0   nystatin (MYCOSTATIN) 100000 UNIT/ML suspension, Take 5 mLs (500,000 Units total) by mouth 4 (four) times daily., Disp: 60 mL, Rfl: 0   oxcarbazepine (TRILEPTAL) 600 MG tablet, Take 1 tablet (600 mg total) by mouth 3 (three) times daily., Disp: 270 tablet, Rfl: 2   oxyCODONE-acetaminophen (PERCOCET) 10-325 MG tablet, Take 1 tablet by mouth every 6 (six) hours as needed for pain., Disp: , Rfl:    pantoprazole (PROTONIX) 40 MG tablet, Take 1 tablet (40 mg total) by mouth daily., Disp: 90 tablet, Rfl: 1   Pharmacist Choice Lancets MISC, , Disp: , Rfl:    rOPINIRole (REQUIP) 0.25 MG tablet, TAKE 1 TO 2 TABLETS BY MOUTH AT BEDTIME FOR  RESTLESS  LEGS, Disp: 180 tablet, Rfl: 1   traMADol (ULTRAM) 50 MG tablet, Take 1 tablet (50 mg total) by mouth daily., Disp: 20 tablet, Rfl: 2  Observations/Objective: Patient is well-developed, well-nourished in no  acute distress.  Resting comfortably at home.  Head is normocephalic, atraumatic.  No labored breathing.  Speech is clear and coherent with logical content.  Patient is alert and oriented at baseline.  Right lower eye lid   Assessment and Plan: 1. Diabetes mellitus without complication (HCC)  2. Diabetes mellitus due to underlying condition with hyperosmolarity without coma, unspecified whether long term insulin use (HCC)  3. Allergic conjunctivitis of right eye  4. Thrush, oral  Strict low carb diet Continue diet Start zyrtec daily, avoid allergens  Continue clotrimazole  Keep chronic follow up  Follow Up Instructions: I discussed the assessment and treatment plan with the patient. The patient was provided an opportunity to ask questions and all were answered. The patient agreed with the plan and demonstrated an understanding of the instructions.  A copy of instructions were sent to the patient via MyChart unless otherwise noted below.     The patient was advised to call back or seek an in-person evaluation  if the symptoms worsen or if the condition fails to improve as anticipated.  Time:  I spent 22 minutes with the patient via telehealth technology discussing the above problems/concerns.    [name], FNP

## 2021-10-21 NOTE — Telephone Encounter (Signed)
Per Christy '1000mg'$  BID. Pharmacy informed

## 2021-11-05 ENCOUNTER — Ambulatory Visit: Payer: Medicare Other | Admitting: Podiatry

## 2021-11-05 ENCOUNTER — Encounter: Payer: Self-pay | Admitting: Podiatry

## 2021-11-05 DIAGNOSIS — M7661 Achilles tendinitis, right leg: Secondary | ICD-10-CM | POA: Diagnosis not present

## 2021-11-05 NOTE — Progress Notes (Signed)
Subjective:   Patient ID: Matthew Pennington, male   DOB: 56 y.o.   MRN: [id number]   HPI Patient presents stating that he is improved and he is just not sure whether or not he is getting need surgery on the bone spur   ROS      Objective:  Physical Exam  Neurovascular status intact significant diminishment of discomfort posterior lateral aspect right heel but does have a small prominent bone spur on this lateral side that is red and currently not irritated but has been irritated     Assessment:  Achilles tendinitis right improved but does have posterior lateral bone spur formation     Plan:  I am hopeful this will stay under control and not require further treatment but if it should come back quick we may be able to do a modified procedure we just take the bone spur off and not have to do full detachment of the tendon.  I did explain this to the patient and reviewed what would be required if that becomes necessary versus a full detachment procedure.  At this point continue with conservative care and will be seen back as needed

## 2021-11-14 ENCOUNTER — Other Ambulatory Visit: Payer: Self-pay | Admitting: Family

## 2021-11-14 DIAGNOSIS — G500 Trigeminal neuralgia: Secondary | ICD-10-CM

## 2021-11-14 DIAGNOSIS — E1169 Type 2 diabetes mellitus with other specified complication: Secondary | ICD-10-CM

## 2021-12-13 DIAGNOSIS — S338XXA Sprain of other parts of lumbar spine and pelvis, initial encounter: Secondary | ICD-10-CM | POA: Diagnosis not present

## 2021-12-13 DIAGNOSIS — M542 Cervicalgia: Secondary | ICD-10-CM | POA: Diagnosis not present

## 2021-12-13 DIAGNOSIS — M47812 Spondylosis without myelopathy or radiculopathy, cervical region: Secondary | ICD-10-CM | POA: Diagnosis not present

## 2021-12-13 DIAGNOSIS — M47816 Spondylosis without myelopathy or radiculopathy, lumbar region: Secondary | ICD-10-CM | POA: Diagnosis not present

## 2021-12-13 DIAGNOSIS — S29012A Strain of muscle and tendon of back wall of thorax, initial encounter: Secondary | ICD-10-CM | POA: Diagnosis not present

## 2021-12-13 DIAGNOSIS — M9901 Segmental and somatic dysfunction of cervical region: Secondary | ICD-10-CM | POA: Diagnosis not present

## 2021-12-13 DIAGNOSIS — M9903 Segmental and somatic dysfunction of lumbar region: Secondary | ICD-10-CM | POA: Diagnosis not present

## 2021-12-13 DIAGNOSIS — M9902 Segmental and somatic dysfunction of thoracic region: Secondary | ICD-10-CM | POA: Diagnosis not present

## 2021-12-31 DIAGNOSIS — M9902 Segmental and somatic dysfunction of thoracic region: Secondary | ICD-10-CM | POA: Diagnosis not present

## 2021-12-31 DIAGNOSIS — S29012A Strain of muscle and tendon of back wall of thorax, initial encounter: Secondary | ICD-10-CM | POA: Diagnosis not present

## 2021-12-31 DIAGNOSIS — S338XXA Sprain of other parts of lumbar spine and pelvis, initial encounter: Secondary | ICD-10-CM | POA: Diagnosis not present

## 2021-12-31 DIAGNOSIS — M542 Cervicalgia: Secondary | ICD-10-CM | POA: Diagnosis not present

## 2021-12-31 DIAGNOSIS — M47812 Spondylosis without myelopathy or radiculopathy, cervical region: Secondary | ICD-10-CM | POA: Diagnosis not present

## 2021-12-31 DIAGNOSIS — M9901 Segmental and somatic dysfunction of cervical region: Secondary | ICD-10-CM | POA: Diagnosis not present

## 2021-12-31 DIAGNOSIS — M9903 Segmental and somatic dysfunction of lumbar region: Secondary | ICD-10-CM | POA: Diagnosis not present

## 2021-12-31 DIAGNOSIS — M47816 Spondylosis without myelopathy or radiculopathy, lumbar region: Secondary | ICD-10-CM | POA: Diagnosis not present

## 2022-01-04 DIAGNOSIS — G4733 Obstructive sleep apnea (adult) (pediatric): Secondary | ICD-10-CM | POA: Diagnosis not present

## 2022-02-04 ENCOUNTER — Ambulatory Visit: Payer: Medicare Other | Admitting: Orthopaedic Surgery

## 2022-02-04 ENCOUNTER — Ambulatory Visit: Payer: Medicare Other

## 2022-02-08 ENCOUNTER — Ambulatory Visit: Payer: Medicare Other | Admitting: Orthopaedic Surgery

## 2022-02-10 DIAGNOSIS — M9902 Segmental and somatic dysfunction of thoracic region: Secondary | ICD-10-CM | POA: Diagnosis not present

## 2022-02-10 DIAGNOSIS — S338XXA Sprain of other parts of lumbar spine and pelvis, initial encounter: Secondary | ICD-10-CM | POA: Diagnosis not present

## 2022-02-10 DIAGNOSIS — M542 Cervicalgia: Secondary | ICD-10-CM | POA: Diagnosis not present

## 2022-02-10 DIAGNOSIS — M47816 Spondylosis without myelopathy or radiculopathy, lumbar region: Secondary | ICD-10-CM | POA: Diagnosis not present

## 2022-02-10 DIAGNOSIS — S29012A Strain of muscle and tendon of back wall of thorax, initial encounter: Secondary | ICD-10-CM | POA: Diagnosis not present

## 2022-02-10 DIAGNOSIS — M47812 Spondylosis without myelopathy or radiculopathy, cervical region: Secondary | ICD-10-CM | POA: Diagnosis not present

## 2022-02-10 DIAGNOSIS — M9901 Segmental and somatic dysfunction of cervical region: Secondary | ICD-10-CM | POA: Diagnosis not present

## 2022-02-10 DIAGNOSIS — M9903 Segmental and somatic dysfunction of lumbar region: Secondary | ICD-10-CM | POA: Diagnosis not present

## 2022-02-11 ENCOUNTER — Ambulatory Visit: Payer: Self-pay

## 2022-02-11 ENCOUNTER — Ambulatory Visit: Payer: Medicare Other | Admitting: Orthopaedic Surgery

## 2022-02-11 ENCOUNTER — Ambulatory Visit (INDEPENDENT_AMBULATORY_CARE_PROVIDER_SITE_OTHER): Payer: Medicare Other

## 2022-02-11 DIAGNOSIS — M7711 Lateral epicondylitis, right elbow: Secondary | ICD-10-CM | POA: Insufficient documentation

## 2022-02-11 DIAGNOSIS — M25521 Pain in right elbow: Secondary | ICD-10-CM | POA: Diagnosis not present

## 2022-02-11 DIAGNOSIS — M7712 Lateral epicondylitis, left elbow: Secondary | ICD-10-CM | POA: Diagnosis not present

## 2022-02-11 DIAGNOSIS — M25522 Pain in left elbow: Secondary | ICD-10-CM | POA: Diagnosis not present

## 2022-02-11 NOTE — Progress Notes (Addendum)
Office Visit Note   Patient: Matthew Pennington           Date of Birth: 07-Feb-1966           MRN: [id number] Visit Date: 02/11/2022              Requested by: Matthew Spencer, FNP [address] [place],  [place] [address] PCP: Matthew Spencer, FNP   Assessment & Plan: Visit Diagnoses:  1. Pain in right hand   2. Pain in right elbow     Plan: Impression is bilateral lateral epicondylitis.  Questionable radial tunnel syndrome.  Treatment options are reviewed.  Patient will purchase counterforce braces.  We will make referral to [facility] in [place] or [place] for dry needling.  Home exercises provided.  Follow-Up Instructions: No follow-ups on file.   Orders:  Orders Placed This Encounter  Procedures   XR Elbow Complete Right (3+View)   XR Elbow Complete Left (3+View)   No orders of the defined types were placed in this encounter.     Procedures: No procedures performed   Clinical Data: No additional findings.   Subjective: Chief Complaint  Patient presents with   Left Elbow - Pain   Right Elbow - Pain    HPI Matthew Pennington is a very pleasant 56 year old gentleman here with his wife for evaluation of bilateral elbow pain.  Feels swelling to this area.  No known injuries.  Takes Motrin which helps.  Well-controlled diabetic.  Currently retired.  Has pain to the lateral elbows with daily activities and with lifting coffee cup.  Does not interfere with sleeping.  Review of Systems  Constitutional: Negative.   All other systems reviewed and are negative.    Objective: Vital Signs: There were no vitals taken for this visit.  Physical Exam Vitals and nursing note reviewed.  Constitutional:      Appearance: He is well-developed.  HENT:     Head: Normocephalic and atraumatic.  Eyes:     Pupils: Pupils are equal, round, and reactive to light.  Pulmonary:     Effort: Pulmonary effort is normal.  Abdominal:     Palpations: Abdomen is soft.  Musculoskeletal:         General: Normal range of motion.     Cervical back: Neck supple.  Skin:    General: Skin is warm.  Neurological:     Mental Status: He is alert and oriented to person, place, and time.  Psychiatric:        Behavior: Behavior normal.        Thought Content: Thought content normal.        Judgment: Judgment normal.     Ortho Exam Lamination of bilateral elbows show pain with terminal extension.  Normal flexion.  Point tenderness to the lateral epicondyle.  Pain with resisted ECRB extension and wrist extension.  Slight tenderness to the common extensor muscle belly. Specialty Comments:  No specialty comments available.  Imaging: XR Elbow Complete Left (3+View)  Result Date: 02/11/2022 Small enthesophyte of the olecranon.  No acute abnormalities.  No significant osteoarthritis or degenerative changes of the elbow joint.  XR Elbow Complete Right (3+View)  Result Date: 02/11/2022 Small enthesophyte of the olecranon.  No acute abnormalities.  No significant osteoarthritis or degenerative changes of the elbow joint.    PMFS History: Patient Active Problem List   Diagnosis Date Noted   Arthritis of carpometacarpal [facility]) joint of right thumb 06/08/2021   Hepatic steatosis 12/24/2019  Gastroesophageal reflux disease 08/23/2019   Controlled substance agreement signed 08/23/2019   Moderate recurrent major depression (HCC) 08/23/2019   OSA (obstructive sleep apnea) 12/05/2017   GAD (generalized anxiety disorder) 11/24/2017   Leg cramps, sleep related 11/24/2017   Hypertension associated with diabetes (HCC) 03/29/2017   Diabetes mellitus (HCC) 03/29/2017   Hyperlipidemia associated with type 2 diabetes mellitus (HCC) 03/29/2017   Diverticulosis of large intestine without hemorrhage 03/29/2017   History of prostatitis 03/29/2017   Light headedness    Tingling    Weakness    Trigeminal neuralgia    Past Medical History:  Diagnosis Date   Diabetes mellitus without complication  (HCC)    H/O echocardiogram 09/2017   normal EF   Headache    Hyperlipidemia    Hypertension    Light headedness    OSA on CPAP    Palpitations    Tingling    toes   Trigeminal neuralgia     Family History  Problem Relation Age of Onset   Diabetes Mother    Hyperlipidemia Mother    Hypertension Mother    Lung cancer Father    Heart disease Sister     Past Surgical History:  Procedure Laterality Date   NO PAST SURGERIES     Social History   Occupational History   Not on file  Tobacco Use   Smoking status: Never   Smokeless tobacco: Never  Vaping Use   Vaping Use: Never used  Substance and Sexual Activity   Alcohol use: No   Drug use: No   Sexual activity: Not on file

## 2022-02-14 ENCOUNTER — Other Ambulatory Visit: Payer: Self-pay | Admitting: Family

## 2022-02-14 ENCOUNTER — Other Ambulatory Visit: Payer: Self-pay | Admitting: Nurse Practitioner

## 2022-02-14 DIAGNOSIS — I152 Hypertension secondary to endocrine disorders: Secondary | ICD-10-CM

## 2022-02-14 DIAGNOSIS — E1169 Type 2 diabetes mellitus with other specified complication: Secondary | ICD-10-CM

## 2022-02-14 DIAGNOSIS — G500 Trigeminal neuralgia: Secondary | ICD-10-CM

## 2022-02-14 DIAGNOSIS — B370 Candidal stomatitis: Secondary | ICD-10-CM

## 2022-02-14 MED ORDER — EZETIMIBE 10 MG PO TABS: 10.0000 mg | ORAL_TABLET | Freq: Every day | ORAL | 0 refills | Status: DC

## 2022-02-14 MED ORDER — NYSTATIN 100000 UNIT/ML MT SUSP: 5.0000 mL | Freq: Four times a day (QID) | OROMUCOSAL | 0 refills | Status: DC

## 2022-02-14 MED ORDER — IBUPROFEN 800 MG PO TABS: 800.0000 mg | ORAL_TABLET | Freq: Every day | ORAL | 0 refills | Status: DC

## 2022-02-14 MED ORDER — GABAPENTIN 300 MG PO CAPS: 300.0000 mg | ORAL_CAPSULE | Freq: Three times a day (TID) | ORAL | 0 refills | Status: DC

## 2022-02-17 DIAGNOSIS — D225 Melanocytic nevi of trunk: Secondary | ICD-10-CM | POA: Diagnosis not present

## 2022-02-17 DIAGNOSIS — L821 Other seborrheic keratosis: Secondary | ICD-10-CM | POA: Diagnosis not present

## 2022-02-18 DIAGNOSIS — M25631 Stiffness of right wrist, not elsewhere classified: Secondary | ICD-10-CM | POA: Diagnosis not present

## 2022-02-18 DIAGNOSIS — M7712 Lateral epicondylitis, left elbow: Secondary | ICD-10-CM | POA: Diagnosis not present

## 2022-02-18 DIAGNOSIS — M25632 Stiffness of left wrist, not elsewhere classified: Secondary | ICD-10-CM | POA: Diagnosis not present

## 2022-02-18 DIAGNOSIS — M25522 Pain in left elbow: Secondary | ICD-10-CM | POA: Diagnosis not present

## 2022-02-18 DIAGNOSIS — M7711 Lateral epicondylitis, right elbow: Secondary | ICD-10-CM | POA: Diagnosis not present

## 2022-02-18 DIAGNOSIS — M25521 Pain in right elbow: Secondary | ICD-10-CM | POA: Diagnosis not present

## 2022-03-03 ENCOUNTER — Ambulatory Visit (INDEPENDENT_AMBULATORY_CARE_PROVIDER_SITE_OTHER): Payer: Medicare Other | Admitting: Family

## 2022-03-03 ENCOUNTER — Encounter: Payer: Self-pay | Admitting: Family

## 2022-03-03 VITALS — BP 119/78 | HR 100 | Temp 98.0°F | Ht 73.0 in | Wt 291.0 lb

## 2022-03-03 DIAGNOSIS — G4762 Sleep related leg cramps: Secondary | ICD-10-CM | POA: Diagnosis not present

## 2022-03-03 DIAGNOSIS — Z0001 Encounter for general adult medical examination with abnormal findings: Secondary | ICD-10-CM

## 2022-03-03 DIAGNOSIS — Z0000 Encounter for general adult medical examination without abnormal findings: Secondary | ICD-10-CM | POA: Diagnosis not present

## 2022-03-03 DIAGNOSIS — M7711 Lateral epicondylitis, right elbow: Secondary | ICD-10-CM

## 2022-03-03 DIAGNOSIS — M6281 Muscle weakness (generalized): Secondary | ICD-10-CM

## 2022-03-03 DIAGNOSIS — K219 Gastro-esophageal reflux disease without esophagitis: Secondary | ICD-10-CM | POA: Diagnosis not present

## 2022-03-03 DIAGNOSIS — G4733 Obstructive sleep apnea (adult) (pediatric): Secondary | ICD-10-CM

## 2022-03-03 DIAGNOSIS — Z79899 Other long term (current) drug therapy: Secondary | ICD-10-CM

## 2022-03-03 DIAGNOSIS — F331 Major depressive disorder, recurrent, moderate: Secondary | ICD-10-CM

## 2022-03-03 DIAGNOSIS — G720 Drug-induced myopathy: Secondary | ICD-10-CM

## 2022-03-03 DIAGNOSIS — E1159 Type 2 diabetes mellitus with other circulatory complications: Secondary | ICD-10-CM | POA: Diagnosis not present

## 2022-03-03 DIAGNOSIS — E1169 Type 2 diabetes mellitus with other specified complication: Secondary | ICD-10-CM | POA: Diagnosis not present

## 2022-03-03 DIAGNOSIS — G500 Trigeminal neuralgia: Secondary | ICD-10-CM | POA: Diagnosis not present

## 2022-03-03 DIAGNOSIS — B370 Candidal stomatitis: Secondary | ICD-10-CM | POA: Diagnosis not present

## 2022-03-03 DIAGNOSIS — M7712 Lateral epicondylitis, left elbow: Secondary | ICD-10-CM

## 2022-03-03 DIAGNOSIS — M545 Low back pain, unspecified: Secondary | ICD-10-CM | POA: Diagnosis not present

## 2022-03-03 DIAGNOSIS — G8929 Other chronic pain: Secondary | ICD-10-CM

## 2022-03-03 DIAGNOSIS — I152 Hypertension secondary to endocrine disorders: Secondary | ICD-10-CM

## 2022-03-03 DIAGNOSIS — R52 Pain, unspecified: Secondary | ICD-10-CM

## 2022-03-03 DIAGNOSIS — F411 Generalized anxiety disorder: Secondary | ICD-10-CM

## 2022-03-03 DIAGNOSIS — E785 Hyperlipidemia, unspecified: Secondary | ICD-10-CM

## 2022-03-03 MED ORDER — OXCARBAZEPINE 600 MG PO TABS: 600.0000 mg | ORAL_TABLET | Freq: Three times a day (TID) | ORAL | 2 refills | Status: DC

## 2022-03-03 MED ORDER — EZETIMIBE 10 MG PO TABS: 10.0000 mg | ORAL_TABLET | Freq: Every day | ORAL | 0 refills | Status: DC

## 2022-03-03 MED ORDER — OXYCODONE-ACETAMINOPHEN 10-325 MG PO TABS: 1.0000 | ORAL_TABLET | Freq: Four times a day (QID) | ORAL | 0 refills | Status: DC | PRN

## 2022-03-03 MED ORDER — TRAMADOL HCL 50 MG PO TABS: 50.0000 mg | ORAL_TABLET | Freq: Every day | ORAL | 2 refills | Status: DC

## 2022-03-03 MED ORDER — ROPINIROLE HCL 0.25 MG PO TABS: ORAL_TABLET | ORAL | 1 refills | Status: DC

## 2022-03-03 MED ORDER — ENALAPRIL MALEATE 20 MG PO TABS: 20.0000 mg | ORAL_TABLET | Freq: Every day | ORAL | 0 refills | Status: DC

## 2022-03-03 MED ORDER — GABAPENTIN 300 MG PO CAPS: 300.0000 mg | ORAL_CAPSULE | Freq: Three times a day (TID) | ORAL | 0 refills | Status: DC

## 2022-03-03 MED ORDER — NYSTATIN 100000 UNIT/ML MT SUSP: 5.0000 mL | Freq: Four times a day (QID) | OROMUCOSAL | 0 refills | Status: DC

## 2022-03-03 MED ORDER — IBUPROFEN 800 MG PO TABS: 800.0000 mg | ORAL_TABLET | Freq: Every day | ORAL | 0 refills | Status: DC

## 2022-03-03 MED ORDER — PANTOPRAZOLE SODIUM 40 MG PO TBEC: 40.0000 mg | DELAYED_RELEASE_TABLET | Freq: Every day | ORAL | 1 refills | Status: DC

## 2022-03-03 NOTE — Patient Instructions (Signed)
Myofascial Pain Syndrome and Fibromyalgia ?Myofascial pain syndrome and fibromyalgia are both pain disorders. You may feel this pain mainly in your muscles. ?Myofascial pain syndrome: ?Always has tender points in the muscles that will cause pain when pressed (trigger points). The pain may come and go. ?Usually affects your neck, upper back, and shoulder areas. The pain often moves into your arms and hands. ?Fibromyalgia: ?Has muscle pains and tenderness that come and go. ?Is often associated with tiredness (fatigue) and sleep problems. ?Has trigger points. ?Tends to be long-lasting (chronic), but is not life-threatening. ?Fibromyalgia and myofascial pain syndrome are not the same. However, they often occur together. If you have both conditions, each can make the other worse. Both are common and can cause enough pain and fatigue to make day-to-day activities difficult. Both can be hard to diagnose because their symptoms are common in many other conditions. ?What are the causes? ?The exact causes of these conditions are not known. ?What increases the risk? ?You are more likely to develop either of these conditions if: ?You have a family history of the condition. ?You are male. ?You have certain triggers, such as: ?Spine disorders. ?An injury (trauma) or other physical stressors. ?Being under a lot of stress. ?Medical conditions such as osteoarthritis, rheumatoid arthritis, or lupus. ?What are the signs or symptoms? ?Fibromyalgia ?The main symptom of fibromyalgia is widespread pain and tenderness in your muscles. Pain is sometimes described as stabbing, shooting, or burning. ?You may also have: ?Tingling or numbness. ?Sleep problems and fatigue. ?Problems with attention and concentration (fibro fog). ?Other symptoms may include: ?Bowel and bladder problems. ?Headaches. ?Vision problems. ?Sensitivity to odors and noises. ?Depression or mood changes. ?Painful menstrual periods (dysmenorrhea). ?Dry skin or eyes. ?These  symptoms can vary over time. ?Myofascial pain syndrome ?Symptoms of myofascial pain syndrome include: ?Tight, ropy bands of muscle. ?Uncomfortable sensations in muscle areas. These may include aching, cramping, burning, numbness, tingling, and weakness. ?Difficulty moving certain parts of the body freely (poor range of motion). ?How is this diagnosed? ?This condition may be diagnosed by your symptoms and medical history. You will also have a physical exam. In general: ?Fibromyalgia is diagnosed if you have pain, fatigue, and other symptoms for more than 3 months, and symptoms cannot be explained by another condition. ?Myofascial pain syndrome is diagnosed if you have trigger points in your muscles, and those trigger points are tender and cause pain elsewhere in your body (referred pain). ?How is this treated? ?Treatment for these conditions depends on the type that you have. ?For fibromyalgia a healthy lifestyle is the most important treatment including aerobic and strength exercises. Different types of medicines are used to help treat pain and include: ?NSAIDs. ?Medicines for treating depression. ?Medicines that help control seizures. ?Medicines that relax the muscles. ?Treatment for myofascial pain syndrome includes: ?Pain medicines, such as NSAIDs. ?Cooling and stretching of muscles. ?Massage therapy with myofascial release technique. ?Trigger point injections. ?Treating these conditions often requires a team of health care providers. These may include: ?Your primary care provider. ?A physical therapist. ?Complementary health care providers, such as massage therapists or acupuncturists. ?A psychiatrist for cognitive behavioral therapy. ?Follow these instructions at home: ?Medicines ?Take over-the-counter and prescription medicines only as told by your health care provider. ?Ask your health care provider if the medicine prescribed to you: ?Requires you to avoid driving or using machinery. ?Can cause constipation.  You may need to take these actions to prevent or treat constipation: ?Drink enough fluid to keep your urine pale   yellow. ?Take over-the-counter or prescription medicines. ?Eat foods that are high in fiber, such as beans, whole grains, and fresh fruits and vegetables. ?Limit foods that are high in fat and processed sugars, such as fried or sweet foods. ?Lifestyle ? ?Do exercises as told by your health care provider or physical therapist. ?Practice relaxation techniques to control your stress. You may want to try: ?Biofeedback. ?Visual imagery. ?Hypnosis. ?Muscle relaxation. ?Yoga. ?Meditation. ?Maintain a healthy lifestyle. This includes eating a healthy diet and getting enough sleep. ?Do not use any products that contain nicotine or tobacco. These products include cigarettes, chewing tobacco, and vaping devices, such as e-cigarettes. If you need help quitting, ask your health care provider. ?General instructions ?Talk to your health care provider about complementary treatments, such as acupuncture or massage. ?Do not do activities that stress or strain your muscles. This includes repetitive motions and heavy lifting. ?Keep all follow-up visits. This is important. ?Where to find support ?Consider joining a support group with others who are diagnosed with this condition. ?National Fibromyalgia Association: www.fmaware.org ?Where to find more information ?American Chronic Pain Association: www.theacpa.org ?Contact a health care provider if: ?You have new symptoms. ?Your symptoms get worse or your pain is severe. ?You have side effects from your medicines. ?You have trouble sleeping. ?Your condition is causing depression or anxiety. ?Get help right away if: ?You have thoughts of hurting yourself or others. ?Get help right awayif you feel like you may hurt yourself or others, or have thoughts about taking your own life. Go to your nearest emergency room or: ?Call 911. ?Call the National Suicide Prevention Lifeline at  1-800-273-8255 or 988. This is open 24 hours a day. ?Text the Crisis Text Line at 741741. ?Summary ?Myofascial pain syndrome and fibromyalgia are pain disorders. ?Myofascial pain syndrome has tender points in the muscles that will cause pain when pressed (trigger points). Fibromyalgia also has muscle pains and tenderness that come and go, but this condition is often associated with fatigue and sleep disturbances. ?Fibromyalgia and myofascial pain syndrome are not the same but often occur together, causing pain and fatigue that make day-to-day activities difficult. ?Follow your health care provider's instructions for taking medicines and maintaining a healthy lifestyle. ?This information is not intended to replace advice given to you by your health care provider. Make sure you discuss any questions you have with your health care provider. ?Document Revised: 04/16/2021 Document Reviewed: 04/16/2021 ?Elsevier Patient Education ? 2023 Elsevier Inc. ? ?

## 2022-03-03 NOTE — Progress Notes (Signed)
Subjective:    Patient ID: Matthew Pennington, male    DOB: 07-07-1965, 56 y.o.   MRN: [id number]  Chief Complaint  Patient presents with   Medical Management of Chronic Issues    Tennis elbow, fibromyalgia    Pt presents to the office today for CPE and chronic follow up. He is followed by Neurologists for Trigeminal neuralgia. She has given him oxycodone as needed for flare ups. He only takes it 3-4 times a year.     He does take Ultram as needed for chronic back pain. He only takes this 1-2 times a month. He never mixes the two.    He is currently going to chiropractor once a week for back and shoulder pain.    He has OSA and uses CPAP nightly.    He is having right hand weakness and is followed by Ortho.    Having bilateral tennis elbow swelling and pain.   He is having a great deal of heel pain. States he has a cyst on his heel and getting steroid injections and followed by podiatry.   Reports having generalized pain and wants to be tested for fibromyalgia.   Reports statins causes muscle pain and can not tolerate.  Hypertension This is a chronic problem. The current episode started more than 1 year ago. The problem has been resolved since onset. The problem is controlled. Associated symptoms include anxiety and malaise/fatigue. Pertinent negatives include no blurred vision, peripheral edema or shortness of breath. Risk factors for coronary artery disease include dyslipidemia, diabetes mellitus, obesity, male gender and sedentary lifestyle. The current treatment provides moderate improvement. There is no history of heart failure.  Hyperlipidemia This is a chronic problem. The current episode started more than 1 year ago. The problem is controlled. Exacerbating diseases include obesity. Pertinent negatives include no shortness of breath. Current antihyperlipidemic treatment includes ezetimibe. The current treatment provides mild improvement of lipids. Risk factors for coronary artery  disease include dyslipidemia, diabetes mellitus, male sex, hypertension and a sedentary lifestyle.  Diabetes He presents for his follow-up diabetic visit. He has type 2 diabetes mellitus. Hypoglycemia symptoms include nervousness/anxiousness. Associated symptoms include foot paresthesias. Pertinent negatives for diabetes include no blurred vision. Symptoms are stable. Diabetic complications include nephropathy and peripheral neuropathy. Risk factors for coronary artery disease include diabetes mellitus, dyslipidemia, male sex, hypertension and sedentary lifestyle. He is following a generally unhealthy diet. His overall blood glucose range is 130-140 mg/dl. Eye exam is current.  Gastroesophageal Reflux He complains of belching and heartburn. This is a chronic problem. The current episode started more than 1 year ago. The problem occurs occasionally. Risk factors include obesity. He has tried a PPI for the symptoms. The treatment provided moderate relief.  Depression        This is a chronic problem.  The current episode started more than 1 year ago.   The problem occurs intermittently.  Associated symptoms include helplessness, hopelessness, restlessness, decreased interest and sad.  Past treatments include nothing.  Past medical history includes anxiety.   Anxiety Presents for follow-up visit. Symptoms include depressed mood, excessive worry, irritability, nervous/anxious behavior and restlessness. Patient reports no shortness of breath. Symptoms occur most days. The severity of symptoms is moderate.        Review of Systems  Constitutional:  Positive for irritability and malaise/fatigue.  Eyes:  Negative for blurred vision.  Respiratory:  Negative for shortness of breath.   Gastrointestinal:  Positive for heartburn.  Psychiatric/Behavioral:  Positive for depression.  The patient is nervous/anxious.   All other systems reviewed and are negative.  Family History  Problem Relation Age of Onset    Diabetes Mother    Hyperlipidemia Mother    Hypertension Mother    Lung cancer Father    Heart disease Sister    Social History   Socioeconomic History   Marital status: Married    Spouse name: Tracie   Number of children: 3   Years of education: Not on file   Highest education level: Associate degree: academic program  Occupational History   Not on file  Tobacco Use   Smoking status: Never   Smokeless tobacco: Never  Vaping Use   Vaping Use: Never used  Substance and Sexual Activity   Alcohol use: No   Drug use: No   Sexual activity: Not on file  Other Topics Concern   Not on file  Social History Narrative   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   Social Determinants of Health   Financial Resource Strain: Low Risk  (02/03/2021)   Overall Financial Resource Strain (CARDIA)    Difficulty of Paying Living Expenses: Not hard at all  Food Insecurity: No Food Insecurity (02/03/2021)   Hunger Vital Sign    Worried About Running Out of Food in the Last Year: Never true    Ran Out of Food in the Last Year: Never true  Transportation Needs: Unmet Transportation Needs (02/03/2021)   PRAPARE - Administrator, Civil Service (Medical): Yes    Lack of Transportation (Non-Medical): Yes  Physical Activity: Insufficiently Active (02/03/2021)   Exercise Vital Sign    Days of Exercise per Week: 2 days    Minutes of Exercise per Session: 10 min  Stress: No Stress Concern Present (02/03/2021)   [employer] of Occupational Health - Occupational Stress Questionnaire    Feeling of Stress : Only a little  Social Connections: Socially Integrated (02/03/2021)   Social Connection and Isolation Panel [NHANES]    Frequency of Communication with Friends and Family: More than three times a week    Frequency of Social Gatherings with Friends and Family: More than three times a week    Attends Religious Services: More than 4  times per year    Active Member of [employer] or Organizations: Yes    Attends Engineer, structural: More than 4 times per year    Marital Status: Married       Objective:   Physical Exam Vitals reviewed.  Constitutional:      General: He is not in acute distress.    Appearance: He is well-developed. He is obese.  HENT:     Head: Normocephalic.     Right Ear: Tympanic membrane normal.     Left Ear: Tympanic membrane normal.  Eyes:     General:        Right eye: No discharge.        Left eye: No discharge.     Pupils: Pupils are equal, round, and reactive to light.  Neck:     Thyroid: No thyromegaly.  Cardiovascular:     Rate and Rhythm: Normal rate and regular rhythm.     Heart sounds: Normal heart sounds. No murmur heard. Pulmonary:     Effort: Pulmonary effort is normal. No respiratory distress.     Breath sounds: Normal breath sounds. No wheezing.  Abdominal:  General: Bowel sounds are normal. There is no distension.     Palpations: Abdomen is soft.     Tenderness: There is no abdominal tenderness.  Musculoskeletal:        General: Swelling (in bilatearl lateral epicondylitis) and tenderness present.     Cervical back: Normal range of motion and neck supple.     Comments: Pain in lumbar with flexion and extension. Pain in bilateral knees with flexion  Skin:    General: Skin is warm and dry.     Findings: No erythema or rash.  Neurological:     Mental Status: He is alert and oriented to person, place, and time.     Cranial Nerves: No cranial nerve deficit.     Deep Tendon Reflexes: Reflexes are normal and symmetric.  Psychiatric:        Behavior: Behavior normal.        Thought Content: Thought content normal.        Judgment: Judgment normal.          BP 119/78   Pulse 100   Temp 98 F (36.7 C) (Temporal)   Ht 6\' 1"  (1.854 m)   Wt 291 lb (132 kg)   SpO2 97%   BMI 38.39 kg/m   Assessment & Plan:  Braylyn Rapier comes in today with chief  complaint of Medical Management of Chronic Issues (Tennis elbow, fibromyalgia )   Diagnosis and orders addressed:  1. Trigeminal neuralgia - traMADol (ULTRAM) 50 MG tablet; Take 1 tablet (50 mg total) by mouth daily.  Dispense: 20 tablet; Refill: 2 - gabapentin (NEURONTIN) 300 MG capsule; Take 1 capsule (300 mg total) by mouth 3 (three) times daily.  Dispense: 270 capsule; Refill: 0 - ibuprofen (ADVIL) 800 MG tablet; Take 1 tablet (800 mg total) by mouth daily.  Dispense: 90 tablet; Refill: 0 - oxcarbazepine (TRILEPTAL) 600 MG tablet; Take 1 tablet (600 mg total) by mouth 3 (three) times daily.  Dispense: 270 tablet; Refill: 2 - CMP14+EGFR - CBC with Differential/Platelet - Ambulatory referral to Rheumatology  2. Chronic bilateral low back pain without sciatica - traMADol (ULTRAM) 50 MG tablet; Take 1 tablet (50 mg total) by mouth daily.  Dispense: 20 tablet; Refill: 2 - oxyCODONE-acetaminophen (PERCOCET) 10-325 MG tablet; Take 1 tablet by mouth every 6 (six) hours as needed for pain.  Dispense: 30 tablet; Refill: 0 - CMP14+EGFR - CBC with Differential/Platelet  3. Gastroesophageal reflux disease, unspecified whether esophagitis present - pantoprazole (PROTONIX) 40 MG tablet; Take 1 tablet (40 mg total) by mouth daily.  Dispense: 90 tablet; Refill: 1 - CMP14+EGFR - CBC with Differential/Platelet  4. Hyperlipidemia associated with type 2 diabetes mellitus (HCC) - ezetimibe (ZETIA) 10 MG tablet; Take 1 tablet (10 mg total) by mouth daily.  Dispense: 90 tablet; Refill: 0 - CMP14+EGFR - CBC with Differential/Platelet  5. Hypertension associated with diabetes (HCC)  - enalapril (VASOTEC) 20 MG tablet; Take 1 tablet (20 mg total) by mouth daily.  Dispense: 90 tablet; Refill: 0 - CMP14+EGFR - CBC with Differential/Platelet  6. Leg cramps, sleep related - rOPINIRole (REQUIP) 0.25 MG tablet; TAKE 1 TO 2 TABLETS BY MOUTH AT BEDTIME FOR  RESTLESS  LEGS  Dispense: 180 tablet; Refill: 1 -  CMP14+EGFR - CBC with Differential/Platelet  7. Thrush, oral - nystatin (MYCOSTATIN) 100000 UNIT/ML suspension; Take 5 mLs (500,000 Units total) by mouth 4 (four) times daily.  Dispense: 60 mL; Refill: 0 - CMP14+EGFR - CBC with Differential/Platelet  8. OSA (obstructive sleep apnea)  -  CMP14+EGFR - CBC with Differential/Platelet  9. Type 2 diabetes mellitus with other specified complication, without long-term current use of insulin (HCC) - CMP14+EGFR - CBC with Differential/Platelet  10. Left lateral epicondylitis  - oxyCODONE-acetaminophen (PERCOCET) 10-325 MG tablet; Take 1 tablet by mouth every 6 (six) hours as needed for pain.  Dispense: 30 tablet; Refill: 0 - CMP14+EGFR - CBC with Differential/Platelet  11. Right lateral epicondylitis - oxyCODONE-acetaminophen (PERCOCET) 10-325 MG tablet; Take 1 tablet by mouth every 6 (six) hours as needed for pain.  Dispense: 30 tablet; Refill: 0 - CMP14+EGFR - CBC with Differential/Platelet  12. GAD (generalized anxiety disorder) - CMP14+EGFR - CBC with Differential/Platelet  13. Controlled substance agreement signed - CMP14+EGFR - CBC with Differential/Platelet - ToxASSURE Select 13 (MW), Urine  14. Moderate recurrent major depression (HCC) - CMP14+EGFR - CBC with Differential/Platelet  15. 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 Generalized muscle weakness - Ambulatory referral to Rheumatology  18. Generalized pain - Ambulatory referral to Rheumatology   Labs pending Patient reviewed in [place] controlled database, no flags noted. Contract and drug screen are up to date.  Health Maintenance reviewed Diet and exercise encouraged  Follow up plan: 3 months    [name], FNP

## 2022-03-04 LAB — CMP14+EGFR
ALT: 72 IU/L — ABNORMAL HIGH (ref 0–44)
AST: 33 IU/L (ref 0–40)
Albumin/Globulin Ratio: 2 (ref 1.2–2.2)
Albumin: 4.7 g/dL (ref 3.8–4.9)
Alkaline Phosphatase: 65 IU/L (ref 44–121)
BUN/Creatinine Ratio: 8 — ABNORMAL LOW (ref 9–20)
BUN: 9 mg/dL (ref 6–24)
Bilirubin Total: 0.4 mg/dL (ref 0.0–1.2)
CO2: 22 mmol/L (ref 20–29)
Calcium: 10.1 mg/dL (ref 8.7–10.2)
Chloride: 102 mmol/L (ref 96–106)
Creatinine, Ser: 1.06 mg/dL (ref 0.76–1.27)
Globulin, Total: 2.4 g/dL (ref 1.5–4.5)
Glucose: 128 mg/dL — ABNORMAL HIGH (ref 70–99)
Potassium: 4.6 mmol/L (ref 3.5–5.2)
Sodium: 139 mmol/L (ref 134–144)
Total Protein: 7.1 g/dL (ref 6.0–8.5)
eGFR: 82 mL/min/{1.73_m2} (ref >59)

## 2022-03-04 LAB — CBC WITH DIFFERENTIAL/PLATELET
Basophils Absolute: 0.1 10*3/uL (ref 0.0–0.2)
Basos: 1 %
EOS (ABSOLUTE): 0.2 10*3/uL (ref 0.0–0.4)
Eos: 3 %
Hematocrit: 43.3 % (ref 37.5–51.0)
Hemoglobin: 15.2 g/dL (ref 13.0–17.7)
Immature Grans (Abs): 0 10*3/uL (ref 0.0–0.1)
Immature Granulocytes: 0 %
Lymphocytes Absolute: 2.8 10*3/uL (ref 0.7–3.1)
Lymphs: 41 %
MCH: 30.8 pg (ref 26.6–33.0)
MCHC: 35.1 g/dL (ref 31.5–35.7)
MCV: 88 fL (ref 79–97)
Monocytes Absolute: 0.6 10*3/uL (ref 0.1–0.9)
Monocytes: 9 %
Neutrophils Absolute: 3.2 10*3/uL (ref 1.4–7.0)
Neutrophils: 46 %
Platelets: 273 10*3/uL (ref 150–450)
RBC: 4.94 x10E6/uL (ref 4.14–5.80)
RDW: 12.1 % (ref 11.6–15.4)
WBC: 6.9 10*3/uL (ref 3.4–10.8)

## 2022-03-04 LAB — LIPID PANEL
Chol/HDL Ratio: 7.2 ratio — ABNORMAL HIGH (ref 0.0–5.0)
Cholesterol, Total: 217 mg/dL — ABNORMAL HIGH (ref 100–199)
HDL: 30 mg/dL — ABNORMAL LOW (ref >39)
LDL Chol Calc (NIH): 136 mg/dL — ABNORMAL HIGH (ref 0–99)
Triglycerides: 283 mg/dL — ABNORMAL HIGH (ref 0–149)
VLDL Cholesterol Cal: 51 mg/dL — ABNORMAL HIGH (ref 5–40)

## 2022-03-04 LAB — TSH: TSH: 1.42 u[IU]/mL (ref 0.450–4.500)

## 2022-03-04 LAB — PSA, TOTAL AND FREE
PSA, Free Pct: 67.5 %
PSA, Free: 0.27 ng/mL
Prostate Specific Ag, Serum: 0.4 ng/mL (ref 0.0–4.0)

## 2022-03-07 ENCOUNTER — Ambulatory Visit: Payer: Medicare Other

## 2022-03-07 ENCOUNTER — Other Ambulatory Visit: Payer: Self-pay | Admitting: Family Medicine

## 2022-03-07 LAB — TOXASSURE SELECT 13 (MW), URINE

## 2022-03-07 MED ORDER — ROSUVASTATIN CALCIUM 5 MG PO TABS: 5.0000 mg | ORAL_TABLET | Freq: Every day | ORAL | 3 refills | Status: DC

## 2022-03-14 ENCOUNTER — Ambulatory Visit: Payer: Medicare Other | Admitting: Podiatry

## 2022-03-15 ENCOUNTER — Ambulatory Visit (INDEPENDENT_AMBULATORY_CARE_PROVIDER_SITE_OTHER): Payer: Medicare Other | Admitting: Family Medicine

## 2022-03-15 DIAGNOSIS — Z0000 Encounter for general adult medical examination without abnormal findings: Secondary | ICD-10-CM

## 2022-03-15 NOTE — Progress Notes (Signed)
Subjective:   Matthew Pennington is a 56 y.o. male who presents for Medicare Annual/Subsequent preventive examination.  I connected with  [name] on 03/15/22 by a audio enabled telemedicine application and verified that I am speaking with the correct person using two identifiers.  Patient Location: Home  Provider Location: Office/Clinic  I discussed the limitations of evaluation and management by telemedicine. The patient expressed understanding and agreed to proceed.  Review of Systems    Defer to PCP       Objective:    There were no vitals filed for this visit. There is no height or weight on file to calculate BMI.     02/03/2021    4:23 PM 07/07/2018    8:28 AM  Advanced Directives  Does Patient Have a Medical Advance Directive? No No  Would patient like information on creating a medical advance directive? No - Patient declined No - Patient declined    Current Medications (verified) Outpatient Encounter Medications as of 03/15/2022  Medication Sig   Blood Glucose Calibration (ONETOUCH VERIO) High SOLN    Blood Glucose Monitoring Suppl (ONETOUCH VERIO REFLECT) w/Device KIT    cetirizine (ZYRTEC ALLERGY) 10 MG tablet Take 1 tablet (10 mg total) by mouth daily. (Patient not taking: Reported on 03/03/2022)   enalapril (VASOTEC) 20 MG tablet Take 1 tablet (20 mg total) by mouth daily.   ezetimibe (ZETIA) 10 MG tablet Take 1 tablet (10 mg total) by mouth daily.   gabapentin (NEURONTIN) 300 MG capsule Take 1 capsule (300 mg total) by mouth 3 (three) times daily.   glucose blood (ONETOUCH VERIO) test strip    ibuprofen (ADVIL) 800 MG tablet Take 1 tablet (800 mg total) by mouth daily.   Lancet Devices (SIMPLE DIAGNOSTICS LANCING DEV) MISC Apply topically.   metFORMIN (GLUCOPHAGE) 500 MG tablet Take 2 tablets (1,000 mg total) by mouth 2 (two) times daily with a meal.   nystatin (MYCOSTATIN) 100000 UNIT/ML suspension Take 5 mLs (500,000 Units total) by mouth 4 (four) times daily.    oxcarbazepine (TRILEPTAL) 600 MG tablet Take 1 tablet (600 mg total) by mouth 3 (three) times daily.   oxyCODONE-acetaminophen (PERCOCET) 10-325 MG tablet Take 1 tablet by mouth every 6 (six) hours as needed for pain.   pantoprazole (PROTONIX) 40 MG tablet Take 1 tablet (40 mg total) by mouth daily.   Pharmacist Choice Lancets MISC    rOPINIRole (REQUIP) 0.25 MG tablet TAKE 1 TO 2 TABLETS BY MOUTH AT BEDTIME FOR  RESTLESS  LEGS   rosuvastatin (CRESTOR) 5 MG tablet Take 1 tablet (5 mg total) by mouth daily.   traMADol (ULTRAM) 50 MG tablet Take 1 tablet (50 mg total) by mouth daily.   No facility-administered encounter medications on file as of 03/15/2022.    Allergies (verified) Morphine and related and Statins   History: Past Medical History:  Diagnosis Date   Diabetes mellitus without complication ([place])    H/O echocardiogram 09/2017   normal EF   Headache    Hyperlipidemia    Hypertension    Light headedness    OSA on CPAP    Palpitations    Tingling    toes   Trigeminal neuralgia    Past Surgical History:  Procedure Laterality Date   NO PAST SURGERIES     Family History  Problem Relation Age of Onset   Diabetes Mother    Hyperlipidemia Mother    Hypertension Mother    Lung cancer Father    Heart  disease Sister    Social History   Socioeconomic History   Marital status: Married    Spouse name: Tracie   Number of children: 3   Years of education: Not on file   Highest education level: Associate degree: academic program  Occupational History   Not on file  Tobacco Use   Smoking status: Never   Smokeless tobacco: Never  Vaping Use   Vaping Use: Never used  Substance and Sexual Activity   Alcohol use: No   Drug use: No   Sexual activity: Not on file  Other Topics Concern   Not on file  Social History Narrative   Patient is retired Quarry manager. Lives with wife   Caffeine coffee 1 cup   3 sons, live near by.   Social Determinants of Health    Financial Resource Strain: Low Risk  (02/03/2021)   Overall Financial Resource Strain (CARDIA)    Difficulty of Paying Living Expenses: Not hard at all  Food Insecurity: No Food Insecurity (02/03/2021)   Hunger Vital Sign    Worried About Running Out of Food in the Last Year: Never true    Ran Out of Food in the Last Year: Never true  Transportation Needs: Unmet Transportation Needs (02/03/2021)   PRAPARE - Hydrologist (Medical): Yes    Lack of Transportation (Non-Medical): Yes  Physical Activity: Insufficiently Active (02/03/2021)   Exercise Vital Sign    Days of Exercise per Week: 2 days    Minutes of Exercise per Session: 10 min  Stress: No Stress Concern Present (02/03/2021)   [place]    Feeling of Stress : Only a little  Social Connections: Socially Integrated (02/03/2021)   Social Connection and Isolation Panel [NHANES]    Frequency of Communication with Friends and Family: More than three times a week    Frequency of Social Gatherings with Friends and Family: More than three times a week    Attends Religious Services: More than 4 times per year    Active Member of [employer] or Organizations: Yes    Attends Music therapist: More than 4 times per year    Marital Status: Married    Tobacco Counseling Counseling given: Not Answered   Clinical Intake:                 Diabetic?Yes Nutrition Risk Assessment:  Has the patient had any N/V/D within the last 2 months?  No  Does the patient have any non-healing wounds?  No  Has the patient had any unintentional weight loss or weight gain?  No   Diabetes:  Is the patient diabetic?  Yes  If diabetic, was a CBG obtained today?  Yes  Did the patient bring in their glucometer from home?  No  How often do you monitor your CBG's? Once daily.   Financial Strains and Diabetes Management:  Are you having any financial  strains with the device, your supplies or your medication? No .  Does the patient want to be seen by Chronic Care Management for management of their diabetes?  No  Would the patient like to be referred to a Nutritionist or for Diabetic Management?  No   Diabetic Exams:  Diabetic eye exam: Patient states he had this year will request results from [facility] Diabetic Foot Exam: Overdue, Pt has been advised about the importance in completing this exam. Pt is scheduled for diabetic foot  exam on Patient will call to schedule once he looks at his calendar.          Activities of Daily Living     No data to display           Patient Care Team: Sharion Balloon, FNP as PCP - General (Family Medicine) Herminio Commons, MD (Inactive) as PCP - Cardiology (Cardiology) Eloise Harman, DO as Consulting Physician (Internal Medicine)  Indicate any recent Medical Services you may have received from other than [facility] providers in the past year (date may be approximate).     Assessment:   This is a routine wellness examination for Monica.  Hearing/Vision screen No results found.  Dietary issues and exercise activities discussed:     Goals Addressed   None   Depression Screen    03/03/2022   10:04 AM 10/19/2021    3:17 PM 08/25/2021    9:50 AM 02/03/2021    4:13 PM 01/08/2021   10:30 AM 08/27/2020   10:28 AM 04/09/2020   11:03 AM  PHQ 2/9 Scores  PHQ - 2 Score 2 2 3 2 3 2 6   PHQ- 9 Score 6 8 11 2 12 8 14     Fall Risk    03/03/2022   10:04 AM 10/19/2021    3:16 PM 08/25/2021    9:50 AM 08/27/2020   10:28 AM 04/09/2020   11:03 AM  Fall Risk   Falls in the past year? 0 0 0 0 0  Follow up  Falls evaluation completed       FALL RISK PREVENTION PERTAINING TO THE HOME:  Any stairs in or around the home? Yes  If so, are there any without handrails? No  Home free of loose throw rugs in walkways, pet beds, electrical cords, etc? Yes  Adequate lighting in your home to  reduce risk of falls? Yes   ASSISTIVE DEVICES UTILIZED TO PREVENT FALLS:  Life alert? No  Use of a cane, walker or w/c? Yes  Grab bars in the bathroom? No  Shower chair or bench in shower? No  Elevated toilet seat or a handicapped toilet? Yes          02/03/2021    4:25 PM  6CIT Screen  What Year? 0 points  What month? 0 points  What time? 0 points  Count back from 20 0 points  Months in reverse 0 points  Repeat phrase 0 points  Total Score 0 points    Immunizations Immunization History  Administered Date(s) Administered   Influenza Split 02/13/2017   Influenza,inj,Quad PF,6+ Mos 02/21/2022   Influenza,inj,quad, With Preservative 02/13/2017, 02/20/2018, 02/05/2019   Influenza-Unspecified 01/29/2020, 03/30/2021    TDAP status: Due, Education has been provided regarding the importance of this vaccine. Advised may receive this vaccine at local pharmacy or Health Dept. Aware to provide a copy of the vaccination record if obtained from local pharmacy or Health Dept. Verbalized acceptance and understanding.  Flu Vaccine status: Up to date  Pneumococcal vaccine status: Declined,  Education has been provided regarding the importance of this vaccine but patient still declined. Advised may receive this vaccine at local pharmacy or Health Dept. Aware to provide a copy of the vaccination record if obtained from local pharmacy or Health Dept. Verbalized acceptance and understanding.   Covid-19 vaccine status: Declined, Education has been provided regarding the importance of this vaccine but patient still declined. Advised may receive this vaccine at local pharmacy or Health Dept.or vaccine clinic. Aware  to provide a copy of the vaccination record if obtained from local pharmacy or Health Dept. Verbalized acceptance and understanding.  Qualifies for Shingles Vaccine? Yes   Zostavax completed No   Shingrix Completed?: No.    Education has been provided regarding the importance of this  vaccine. Patient has been advised to call insurance company to determine out of pocket expense if they have not yet received this vaccine. Advised may also receive vaccine at local pharmacy or Health Dept. Verbalized acceptance and understanding.  Screening Tests Health Maintenance  Topic Date Due   COVID-19 Vaccine (1) Never done   TETANUS/TDAP  Never done   Zoster Vaccines- Shingrix (1 of 2) Never done   Diabetic kidney evaluation - Urine ACR  08/22/2020   OPHTHALMOLOGY EXAM  06/29/2021   FOOT EXAM  08/27/2021   HEMOGLOBIN A1C  03/03/2022   Diabetic kidney evaluation - GFR measurement  03/04/2023   COLONOSCOPY (Pts 45-89yr Insurance coverage will need to be confirmed)  01/28/2030   INFLUENZA VACCINE  Completed   Hepatitis C Screening  Completed   HIV Screening  Completed   HPV VACCINES  Aged Out    Health Maintenance  Health Maintenance Due  Topic Date Due   COVID-19 Vaccine (1) Never done   TETANUS/TDAP  Never done   Zoster Vaccines- Shingrix (1 of 2) Never done   Diabetic kidney evaluation - Urine ACR  08/22/2020   OPHTHALMOLOGY EXAM  06/29/2021   FOOT EXAM  08/27/2021   HEMOGLOBIN A1C  03/03/2022    Cologuard negative completed 01/14/20 every 3 years  Lung Cancer Screening: (Low Dose CT Chest recommended if Age 56-80years, 30 pack-year currently smoking OR have quit w/in 15years.) does not qualify.   Lung Cancer Screening Referral: NA Additional Screening:  Hepatitis C Screening: does not qualify; Completed 04/09/2020  Vision Screening: Recommended annual ophthalmology exams for early detection of glaucoma and other disorders of the eye. Is the patient up to date with their annual eye exam?  Yes  Who is the provider or what is the name of the office in which the patient attends annual eye exams? [facility] will request records  If pt is not established with a provider, would they like to be referred to a provider to establish care? No .   Dental Screening:  Recommended annual dental exams for proper oral hygiene  Community Resource Referral / Chronic Care Management: CRR required this visit?  No   CCM required this visit?  No      Plan:     I have personally reviewed and noted the following in the patient's chart:   Medical and social history Use of alcohol, tobacco or illicit drugs  Current medications and supplements including opioid prescriptions. Patient is currently taking opioid prescriptions. Information provided to patient regarding non-opioid alternatives. Patient advised to discuss non-opioid treatment plan with their provider. Functional ability and status Nutritional status Physical activity Advanced directives List of other physicians Hospitalizations, surgeries, and ER visits in previous 12 months Vitals Screenings to include cognitive, depression, and falls Referrals and appointments  In addition, I have reviewed and discussed with patient certain preventive protocols, quality metrics, and best practice recommendations. A written personalized care plan for preventive services as well as general preventive health recommendations were provided to patient.     [place]  03/15/2022   Nurse Notes: AVS printed and mailed

## 2022-03-15 NOTE — Patient Instructions (Signed)
  [name] , Thank you for taking time to come for your Medicare Wellness Visit. I appreciate your ongoing commitment to your health goals. Please review the following plan we discussed and let me know if I can assist you in the future.   These are the goals we discussed:  Goals      Have 3 meals a day     Pt would like to drink more water and watch carb intake.     Patient Stated     03/15/2022 AWV Goal: Diabetes Management  Patient will maintain an A1C level below 8.0 Patient will not develop any diabetic foot complications Patient will not experience any hypoglycemic episodes over the next 3 months Patient will notify our office of any CBG readings outside of the provider recommended range by calling [phone] Patient will adhere to provider recommendations for diabetes management  Patient Self Management Activities take all medications as prescribed and report any negative side effects monitor and record blood sugar readings as directed adhere to a low carbohydrate diet that incorporates lean proteins, vegetables, whole grains, low glycemic fruits check feet daily noting any sores, cracks, injuries, or callous formations see PCP or podiatrist if he notices any changes in his legs, feet, or toenails Patient will visit PCP and have an A1C level checked every 3 to 6 months as directed  have a yearly eye exam to monitor for vascular changes associated with diabetes and will request that the report be sent to his pcp.  consult with his PCP regarding any changes in his health or new or worsening symptoms         This is a list of the screening recommended for you and due dates:  Health Maintenance  Topic Date Due   COVID-19 Vaccine (1) Never done   Tetanus Vaccine  Never done   Zoster (Shingles) Vaccine (1 of 2) Never done   Yearly kidney health urinalysis for diabetes  08/22/2020   Eye exam for diabetics  06/29/2021   Complete foot exam   08/27/2021   Hemoglobin A1C   03/03/2022   Cologuard (Stool DNA test)  01/14/2023   Yearly kidney function blood test for diabetes  03/04/2023   Flu Shot  Completed   Hepatitis C Screening: USPSTF Recommendation to screen - Ages 18-79 yo.  Completed   HIV Screening  Completed   HPV Vaccine  Aged Out   Colon Cancer Screening  Discontinued

## 2022-03-23 ENCOUNTER — Encounter: Payer: Self-pay | Admitting: Podiatry

## 2022-03-23 ENCOUNTER — Ambulatory Visit: Payer: Medicare Other | Admitting: Podiatry

## 2022-03-23 DIAGNOSIS — M7661 Achilles tendinitis, right leg: Secondary | ICD-10-CM

## 2022-03-23 MED ORDER — TRIAMCINOLONE ACETONIDE 10 MG/ML IJ SUSP
10.0000 mg | Freq: Once | INTRAMUSCULAR | Status: AC
  Administered 2022-03-23: 10 mg

## 2022-03-23 NOTE — Progress Notes (Signed)
Subjective:   Patient ID: Matthew Pennington, male   DOB: 56 y.o.   MRN: [id number]   HPI Patient presents stating he is considering surgery but he would like to avoid that now as he is still trying to work on losing weight and other improvements.  States that just are not heard in the last few weeks   ROS      Objective:  Physical Exam  Neuro vascular status intact with inflammation pain around the lateral side of the Achilles tendon insertion with no central medial pain with fluid buildup and spur formation     Assessment:  Lateral spur formation posterior aspect right heel with inflammatory component     Plan:  H&P reviewed and I do think eventually bone surgery will be necessary but he wants to avoid that as best as he can.  At this point I did go ahead and I did do sterile prep after explaining to him chances for rupture which completely understands and I carefully injected the lateral side 3 mg dexamethasone Kenalog 5 mg Xylocaine not into the center medial portion of the tendon.  It has been approximate 6 months since last injection

## 2022-04-06 DIAGNOSIS — G4733 Obstructive sleep apnea (adult) (pediatric): Secondary | ICD-10-CM | POA: Diagnosis not present

## 2022-04-12 DIAGNOSIS — M189 Osteoarthritis of first carpometacarpal joint, unspecified: Secondary | ICD-10-CM | POA: Insufficient documentation

## 2022-04-12 DIAGNOSIS — M1812 Unilateral primary osteoarthritis of first carpometacarpal joint, left hand: Secondary | ICD-10-CM | POA: Diagnosis not present

## 2022-04-19 DIAGNOSIS — M9903 Segmental and somatic dysfunction of lumbar region: Secondary | ICD-10-CM | POA: Diagnosis not present

## 2022-04-19 DIAGNOSIS — M542 Cervicalgia: Secondary | ICD-10-CM | POA: Diagnosis not present

## 2022-04-19 DIAGNOSIS — M47812 Spondylosis without myelopathy or radiculopathy, cervical region: Secondary | ICD-10-CM | POA: Diagnosis not present

## 2022-04-19 DIAGNOSIS — M9901 Segmental and somatic dysfunction of cervical region: Secondary | ICD-10-CM | POA: Diagnosis not present

## 2022-04-19 DIAGNOSIS — S338XXA Sprain of other parts of lumbar spine and pelvis, initial encounter: Secondary | ICD-10-CM | POA: Diagnosis not present

## 2022-04-19 DIAGNOSIS — M9902 Segmental and somatic dysfunction of thoracic region: Secondary | ICD-10-CM | POA: Diagnosis not present

## 2022-04-19 DIAGNOSIS — S29012A Strain of muscle and tendon of back wall of thorax, initial encounter: Secondary | ICD-10-CM | POA: Diagnosis not present

## 2022-04-19 DIAGNOSIS — M47816 Spondylosis without myelopathy or radiculopathy, lumbar region: Secondary | ICD-10-CM | POA: Diagnosis not present

## 2022-04-20 ENCOUNTER — Other Ambulatory Visit: Payer: Self-pay

## 2022-04-20 ENCOUNTER — Emergency Department (HOSPITAL_COMMUNITY): Payer: Medicare Other

## 2022-04-20 ENCOUNTER — Encounter (HOSPITAL_COMMUNITY): Payer: Self-pay | Admitting: *Deleted

## 2022-04-20 ENCOUNTER — Emergency Department (HOSPITAL_COMMUNITY)
Admission: EM | Admit: 2022-04-20 | Discharge: 2022-04-20 | Disposition: A | Discharge status: Home / Self Care | Payer: Medicare Other | Attending: Emergency Medicine | Admitting: Emergency Medicine

## 2022-04-20 DIAGNOSIS — N200 Calculus of kidney: Secondary | ICD-10-CM | POA: Insufficient documentation

## 2022-04-20 DIAGNOSIS — R109 Unspecified abdominal pain: Secondary | ICD-10-CM | POA: Diagnosis not present

## 2022-04-20 DIAGNOSIS — R1032 Left lower quadrant pain: Secondary | ICD-10-CM | POA: Diagnosis present

## 2022-04-20 DIAGNOSIS — I700 Atherosclerosis of aorta: Secondary | ICD-10-CM | POA: Diagnosis not present

## 2022-04-20 LAB — COMPREHENSIVE METABOLIC PANEL
ALT: 45 U/L — ABNORMAL HIGH (ref 0–44)
AST: 27 U/L (ref 15–41)
Albumin: 4.2 g/dL (ref 3.5–5.0)
Alkaline Phosphatase: 57 U/L (ref 38–126)
Anion gap: 7 (ref 5–15)
BUN: 12 mg/dL (ref 6–20)
CO2: 23 mmol/L (ref 22–32)
Calcium: 9.4 mg/dL (ref 8.9–10.3)
Chloride: 105 mmol/L (ref 98–111)
Creatinine, Ser: 0.86 mg/dL (ref 0.61–1.24)
GFR, Estimated: >60 mL/min (ref >60)
Glucose, Bld: 128 mg/dL — ABNORMAL HIGH (ref 70–99)
Potassium: 4 mmol/L (ref 3.5–5.1)
Sodium: 135 mmol/L (ref 135–145)
Total Bilirubin: 1 mg/dL (ref 0.3–1.2)
Total Protein: 7.8 g/dL (ref 6.5–8.1)

## 2022-04-20 LAB — CBC WITH DIFFERENTIAL/PLATELET
Abs Immature Granulocytes: 0.02 10*3/uL (ref 0.00–0.07)
Basophils Absolute: 0.1 10*3/uL (ref 0.0–0.1)
Basophils Relative: 1 %
Eosinophils Absolute: 0.2 10*3/uL (ref 0.0–0.5)
Eosinophils Relative: 2 %
HCT: 45.8 % (ref 39.0–52.0)
Hemoglobin: 15.7 g/dL (ref 13.0–17.0)
Immature Granulocytes: 0 %
Lymphocytes Relative: 31 %
Lymphs Abs: 2.4 10*3/uL (ref 0.7–4.0)
MCH: 30.7 pg (ref 26.0–34.0)
MCHC: 34.3 g/dL (ref 30.0–36.0)
MCV: 89.5 fL (ref 80.0–100.0)
Monocytes Absolute: 0.7 10*3/uL (ref 0.1–1.0)
Monocytes Relative: 9 %
Neutro Abs: 4.4 10*3/uL (ref 1.7–7.7)
Neutrophils Relative %: 57 %
Platelets: 259 10*3/uL (ref 150–400)
RBC: 5.12 MIL/uL (ref 4.22–5.81)
RDW: 12.7 % (ref 11.5–15.5)
WBC: 7.8 10*3/uL (ref 4.0–10.5)
nRBC: 0 % (ref 0.0–0.2)

## 2022-04-20 LAB — URINALYSIS, ROUTINE W REFLEX MICROSCOPIC
Bilirubin Urine: NEGATIVE
Glucose, UA: NEGATIVE mg/dL
Hgb urine dipstick: NEGATIVE
Ketones, ur: NEGATIVE mg/dL
Leukocytes,Ua: NEGATIVE
Nitrite: NEGATIVE
Protein, ur: NEGATIVE mg/dL
Specific Gravity, Urine: 1.006 (ref 1.005–1.030)
pH: 6 (ref 5.0–8.0)

## 2022-04-20 MED ORDER — TAMSULOSIN HCL 0.4 MG PO CAPS: 0.4000 mg | ORAL_CAPSULE | Freq: Every day | ORAL | 0 refills | Status: DC

## 2022-04-20 MED ORDER — HYDROMORPHONE HCL 1 MG/ML IJ SOLN
0.5000 mg | Freq: Once | INTRAMUSCULAR | Status: DC
  Filled 2022-04-20: qty 0.5

## 2022-04-20 MED ORDER — ONDANSETRON 4 MG PO TBDP: ORAL_TABLET | ORAL | 0 refills | Status: DC

## 2022-04-20 MED ORDER — OXYCODONE-ACETAMINOPHEN 5-325 MG PO TABS: 1.0000 | ORAL_TABLET | Freq: Four times a day (QID) | ORAL | 0 refills | Status: DC | PRN

## 2022-04-20 MED ORDER — ONDANSETRON HCL 4 MG/2ML IJ SOLN
4.0000 mg | Freq: Once | INTRAMUSCULAR | Status: DC
  Filled 2022-04-20: qty 2

## 2022-04-20 MED ORDER — IOHEXOL 300 MG/ML  SOLN: 100.0000 mL | Freq: Once | INTRAMUSCULAR | Status: DC | PRN

## 2022-04-20 NOTE — ED Notes (Signed)
Patient refused medication at this time.

## 2022-04-20 NOTE — Discharge Instructions (Signed)
Follow-up with [facility] next week for recheck.  Return if any problems.  If it occurs either Thursday or over the weekend then you should go to [facility]

## 2022-04-20 NOTE — ED Triage Notes (Signed)
Pt c/o left sided abdominal pain along with nausea and 1 episode of diarrhea this morning. Denies urinary symptoms.

## 2022-04-20 NOTE — ED Provider Notes (Signed)
[facility] EMERGENCY DEPARTMENT Provider Note   CSN: [id number] Arrival date & time: 04/20/22  2751     History  Chief Complaint  Patient presents with   Abdominal Pain    Matthew Pennington is a 56 y.o. male.  Patient complains of left lower quadrant pain.  Patient has a history of hyperlipidemia  The history is provided by the patient and medical records. No language interpreter was used.  Abdominal Pain Pain location:  LLQ Pain quality: aching   Pain radiates to:  Does not radiate Pain severity:  Moderate Onset quality:  Sudden Timing:  Constant Progression:  Worsening Chronicity:  New Context: not alcohol use   Relieved by:  Nothing Worsened by:  Nothing Associated symptoms: no chest pain, no cough, no diarrhea, no fatigue and no hematuria        Home Medications Prior to Admission medications   Medication Sig Start Date End Date Taking? Authorizing Provider  ondansetron (ZOFRAN-ODT) 4 MG disintegrating tablet 51m ODT q4 hours prn nausea/vomit 04/20/22  Yes ZMilton Ferguson MD  oxyCODONE-acetaminophen (PERCOCET/ROXICET) 5-325 MG tablet Take 1 tablet by mouth every 6 (six) hours as needed for severe pain. 04/20/22  Yes ZMilton Ferguson MD  tamsulosin (FLOMAX) 0.4 MG CAPS capsule Take 1 capsule (0.4 mg total) by mouth daily. 04/20/22  Yes ZMilton Ferguson MD  Blood Glucose Calibration (ONETOUCH VERIO) High SOLN  02/27/20   [provider]  Blood Glucose Monitoring Suppl (ONETOUCH VERIO REFLECT) w/Device KIT  02/27/20   [provider]  cetirizine (ZYRTEC ALLERGY) 10 MG tablet Take 1 tablet (10 mg total) by mouth daily. 10/21/21   HEvelina DunA, FNP  enalapril (VASOTEC) 20 MG tablet Take 1 tablet (20 mg total) by mouth daily. 03/03/22   HSharion Balloon FNP  ezetimibe (ZETIA) 10 MG tablet Take 1 tablet (10 mg total) by mouth daily. 03/03/22   HSharion Balloon FNP  gabapentin (NEURONTIN) 300 MG capsule Take 1 capsule (300 mg total) by mouth 3 (three) times  daily. 03/03/22   HEvelina DunA, FNP  glucose blood (ONETOUCH VERIO) test strip  02/27/20   [provider]  ibuprofen (ADVIL) 800 MG tablet Take 1 tablet (800 mg total) by mouth daily. 03/03/22   HSharion Balloon FNP  Lancet Devices (SIMPLE DIAGNOSTICS LANCING DEV) MISC Apply topically. 07/26/21   [provider]  metFORMIN (GLUCOPHAGE) 500 MG tablet Take 2 tablets (1,000 mg total) by mouth 2 (two) times daily with a meal. 10/21/21   Hawks, CAlyse LowA, FNP  nystatin (MYCOSTATIN) 100000 UNIT/ML suspension Take 5 mLs (500,000 Units total) by mouth 4 (four) times daily. 03/03/22   HSharion Balloon FNP  oxcarbazepine (TRILEPTAL) 600 MG tablet Take 1 tablet (600 mg total) by mouth 3 (three) times daily. 03/03/22   HEvelina DunA, FNP  pantoprazole (PROTONIX) 40 MG tablet Take 1 tablet (40 mg total) by mouth daily. 03/03/22   HSharion Balloon FNP  Pharmacist Choice Lancets [place] 07/26/21   [provider]  rOPINIRole (REQUIP) 0.25 MG tablet TAKE 1 TO 2 TABLETS BY MOUTH AT BEDTIME FOR  RESTLESS  LEGS 03/03/22   HEvelina DunA, FNP  rosuvastatin (CRESTOR) 5 MG tablet Take 1 tablet (5 mg total) by mouth daily. 03/07/22   HSharion Balloon FNP  traMADol (ULTRAM) 50 MG tablet Take 1 tablet (50 mg total) by mouth daily. 03/03/22   HSharion Balloon FNP      Allergies    Morphine and  related and Statins    Review of Systems   Review of Systems  Constitutional:  Negative for appetite change and fatigue.  HENT:  Negative for congestion, ear discharge and sinus pressure.   Eyes:  Negative for discharge.  Respiratory:  Negative for cough.   Cardiovascular:  Negative for chest pain.  Gastrointestinal:  Positive for abdominal pain. Negative for diarrhea.  Genitourinary:  Negative for frequency and hematuria.  Musculoskeletal:  Negative for back pain.  Skin:  Negative for rash.  Neurological:  Negative for seizures and headaches.  Psychiatric/Behavioral:  Negative for  hallucinations.     Physical Exam Updated Vital Signs BP 118/86   Pulse 64   Temp (!) 97.5 F (36.4 C) (Oral)   Resp 19   Ht _0  (1.854 m)   Wt 129.3 kg   SpO2 96%   BMI 37.60 kg/m  Physical Exam Vitals and nursing note reviewed.  Constitutional:      Appearance: He is well-developed.  HENT:     Head: Normocephalic.     Nose: Nose normal.  Eyes:     General: No scleral icterus.    Conjunctiva/sclera: Conjunctivae normal.  Neck:     Thyroid: No thyromegaly.  Cardiovascular:     Rate and Rhythm: Normal rate and regular rhythm.     Heart sounds: No murmur heard.    No friction rub. No gallop.  Pulmonary:     Breath sounds: No stridor. No wheezing or rales.  Chest:     Chest wall: No tenderness.  Abdominal:     General: There is no distension.     Tenderness: There is abdominal tenderness. There is no rebound.  Musculoskeletal:        General: Normal range of motion.     Cervical back: Neck supple.  Lymphadenopathy:     Cervical: No cervical adenopathy.  Skin:    Findings: No erythema or rash.  Neurological:     Mental Status: He is alert and oriented to person, place, and time.     Motor: No abnormal muscle tone.     Coordination: Coordination normal.  Psychiatric:        Behavior: Behavior normal.     ED Results / Procedures / Treatments   Labs (all labs ordered are listed, but only abnormal results are displayed) Labs Reviewed  COMPREHENSIVE METABOLIC PANEL - Abnormal; Notable for the following components:      Result Value   Glucose, Bld 128 (*)    ALT 45 (*)    All other components within normal limits  URINALYSIS, ROUTINE W REFLEX MICROSCOPIC - Abnormal; Notable for the following components:   Color, Urine STRAW (*)    All other components within normal limits  CBC WITH DIFFERENTIAL/PLATELET    EKG None  Radiology CT ABDOMEN PELVIS WO CONTRAST  Result Date: 04/20/2022 CLINICAL DATA:  Acute left-sided abdominal pain. EXAM: CT ABDOMEN AND  PELVIS WITHOUT CONTRAST TECHNIQUE: Multidetector CT imaging of the abdomen and pelvis was performed following the standard protocol without IV contrast. RADIATION DOSE REDUCTION: This exam was performed according to the departmental dose-optimization program which includes automated exposure control, adjustment of the mA and/or kV according to patient size and/or use of iterative reconstruction technique. COMPARISON:  None Available. FINDINGS: Lower chest: Left lower lobe airspace opacity is noted concerning for pneumonia. Hepatobiliary: Hepatic steatosis. No cholelithiasis or biliary dilatation is noted. Pancreas: Unremarkable. No pancreatic ductal dilatation or surrounding inflammatory changes. Spleen: Normal in size without focal abnormality.  Adrenals/Urinary Tract: Adrenal glands appear normal. Nonobstructive left renal calculus is noted. No significant hydronephrosis is noted, although 5 mm calculus is seen at right ureteropelvic junction. Urinary bladder is unremarkable. Stomach/Bowel: Stomach is within normal limits. Appendix appears normal. No evidence of bowel wall thickening, distention, or inflammatory changes. Vascular/Lymphatic: Aortic atherosclerosis. No enlarged abdominal or pelvic lymph nodes. Reproductive: Prostate is unremarkable. Other: No abdominal wall hernia or abnormality. No abdominopelvic ascites. Musculoskeletal: No acute or significant osseous findings. IMPRESSION: 5 mm calculus is noted at right ureteropelvic junction, but no significant hydronephrosis is noted. Nonobstructive left renal calculus is noted. Left lower lobe airspace opacity is noted concerning for possible pneumonia or atelectasis. Hepatic steatosis. Aortic Atherosclerosis (ICD10-I70.0). Electronically Signed   By: Marijo Conception M.D.   On: 04/20/2022 10:46    Procedures Procedures    Medications Ordered in ED Medications  HYDROmorphone (DILAUDID) injection 0.5 mg (0.5 mg Intravenous Not Given 04/20/22 0938)   ondansetron (ZOFRAN) injection 4 mg (4 mg Intravenous Not Given 04/20/22 0938)  iohexol (OMNIPAQUE) 300 MG/ML solution 100 mL (has no administration in time range)    ED Course/ Medical Decision Making/ A&P                           Medical Decision Making Amount and/or Complexity of Data Reviewed Labs: ordered. Radiology: ordered.  Risk Prescription drug management.  This patient presents to the ED for concern of left lower quadrant pain, this involves an extensive number of treatment options, and is a complaint that carries with it a high risk of complications and morbidity.  The differential diagnosis includes diverticulitis, kidney stone   Co morbidities that complicate the patient evaluation  Hyperlipidemia   Additional history obtained:  Additional history obtained from patient External records from outside source obtained and reviewed including hospital records   Lab Tests:  I Ordered, and personally interpreted labs.  The pertinent results include: CBC chemistry and urine negative   Imaging Studies ordered:  I ordered imaging studies including CT abdomen I independently visualized and interpreted imaging which showed 5 mm proximal ureteral stone on the right side I agree with the radiologist interpretation   Cardiac Monitoring: / EKG:  The patient was maintained on a cardiac monitor.  I personally viewed and interpreted the cardiac monitored which showed an underlying rhythm of: Normal sinus rhythm   Consultations Obtained:  No consultant Problem List / ED Course / Critical interventions / Medication management  Hyperlipidemia and kidney stone I ordered medication including Dilaudid for pain Reevaluation of the patient after these medicines showed that the patient improved I have reviewed the patients home medicines and have made adjustments as needed   Social Determinants of Health:  None   Test / Admission - Considered:  None  Patient with  a kidney stone in the proximal left ureter.  He is given Flomax Percocets and Zofran and will follow-up with urology        Final Clinical Impression(s) / ED Diagnoses Final diagnoses:  Kidney stone    Rx / DC Orders ED Discharge Orders          Ordered    oxyCODONE-acetaminophen (PERCOCET/ROXICET) 5-325 MG tablet  Every 6 hours PRN        04/20/22 1148    ondansetron (ZOFRAN-ODT) 4 MG disintegrating tablet        04/20/22 1148    tamsulosin (FLOMAX) 0.4 MG CAPS capsule  Daily  04/20/22 [place], MD 04/23/22 [id number]

## 2022-04-27 ENCOUNTER — Ambulatory Visit (INDEPENDENT_AMBULATORY_CARE_PROVIDER_SITE_OTHER): Payer: Medicare Other | Admitting: Urology

## 2022-04-27 ENCOUNTER — Encounter: Payer: Self-pay | Admitting: Urology

## 2022-04-27 VITALS — BP 146/82 | HR 78 | Ht 73.0 in | Wt 285.0 lb

## 2022-04-27 DIAGNOSIS — N201 Calculus of ureter: Secondary | ICD-10-CM

## 2022-04-27 DIAGNOSIS — N200 Calculus of kidney: Secondary | ICD-10-CM | POA: Diagnosis not present

## 2022-04-27 LAB — POCT URINALYSIS DIPSTICK
Bilirubin, UA: NEGATIVE
Blood, UA: NEGATIVE
Glucose, UA: NEGATIVE
Nitrite, UA: NEGATIVE
Protein, UA: POSITIVE — AB
Spec Grav, UA: 1.025 (ref 1.010–1.025)
Urobilinogen, UA: 0.2 E.U./dL
pH, UA: 5 (ref 5.0–8.0)

## 2022-04-27 NOTE — Progress Notes (Signed)
Assessment: 1. Ureteral calculus, right   2. Nephrolithiasis      Plan: I personally reviewed the patient's records including provider notes, lab results, and imaging results from the emergency room visit on 04/20/2022. I personally reviewed the CT study from 04/20/2022 with results as noted below. I discussed the CT results with the patient and his wife in detail today. Diagnosis and treatment options for a ureteral calculus including spontaneous stone passage, shockwave lithotripsy, and ureteroscopic stone manipulation were discussed with [name] in detail. He has elected to attempt to pass the ureteral calculus. Strain urine Pain medication as needed D/C tamsulosin due to side effects Follow-up in 2 weeks with [facility] Return to office or ER for uncontrolled pain, vomiting, fever, or chills.   Chief Complaint:  Chief Complaint  Patient presents with   ureteral calculus    History of Present Illness:  Matthew Pennington is a 56 y.o. male who is seen in consultation from Evelina Dun A, FNP for evaluation of a right ureteral calculus.  He presented to the emergency room on 04/20/2022 with left sided abdominal pain.  His symptoms began that morning.  He has some associated nausea without vomiting.  No fevers or chills.  No flank pain.  No dysuria or gross hematuria.  CT abdomen and pelvis without contrast showed a small nonobstructive left renal calculus and a 3 x 5 mm calculus at the right UPJ without evidence of obstruction.  His abdominal pain resolved.  He has not had any additional pain in the abdomen or flank.  He has noted some slight dysuria recently.  He is not aware of passing a stone but has not been straining his urine on a regular basis.  He has been taking tamsulosin but is experiencing dizziness and nasal congestion. He has a remote history of a kidney stone a number of years ago, passing this spontaneously.   Past Medical History:  Past Medical History:  Diagnosis  Date   Diabetes mellitus without complication ([place])    H/O echocardiogram 09/2017   normal EF   Headache    Hyperlipidemia    Hypertension    Light headedness    OSA on CPAP    Palpitations    Tingling    toes   Trigeminal neuralgia     Past Surgical History:  Past Surgical History:  Procedure Laterality Date   NO PAST SURGERIES      Allergies:  Allergies  Allergen Reactions   Morphine And Related Other (See Comments)    "Makes me go Crazy"   Statins Other (See Comments)    Joint/mucles pains    Family History:  Family History  Problem Relation Age of Onset   Diabetes Mother    Hyperlipidemia Mother    Hypertension Mother    Cancer Father    Lung cancer Father    Heart disease Sister     Social History:  Social History   Tobacco Use   Smoking status: Never   Smokeless tobacco: Never  Vaping Use   Vaping Use: Never used  Substance Use Topics   Alcohol use: No   Drug use: No    Review of symptoms:  Constitutional:  Negative for unexplained weight loss, night sweats, fever, chills ENT:  Negative for nose bleeds, sinus pain, painful swallowing CV:  Negative for chest pain, shortness of breath, exercise intolerance, palpitations, loss of consciousness Resp:  Negative for cough, wheezing, shortness of breath GI:  Negative for nausea, vomiting, diarrhea, bloody  stools GU:  Positives noted in HPI; otherwise negative for gross hematuria, dysuria, urinary incontinence Neuro:  Negative for seizures, poor balance, limb weakness, slurred speech Psych:  Negative for lack of energy, depression, anxiety Endocrine:  Negative for polydipsia, polyuria, symptoms of hypoglycemia (dizziness, hunger, sweating) Hematologic:  Negative for anemia, purpura, petechia, prolonged or excessive bleeding, use of anticoagulants  Allergic:  Negative for difficulty breathing or choking as a result of exposure to anything; no shellfish allergy; no allergic response (rash/itch) to  materials, foods  Physical exam: BP (!) 146/82   Pulse 78   Ht '6\' 1"'$  (1.854 m)   Wt 285 lb (129.3 kg)   BMI 37.60 kg/m  GENERAL APPEARANCE:  Well appearing, well developed, well nourished, NAD HEENT: Atraumatic, Normocephalic, oropharynx clear. NECK: Supple without lymphadenopathy or thyromegaly. LUNGS: Clear to auscultation bilaterally. HEART: Regular Rate and Rhythm without murmurs, gallops, or rubs. ABDOMEN: Soft, non-tender, No Masses. EXTREMITIES: Moves all extremities well.  Without clubbing, cyanosis, or edema. NEUROLOGIC:  Alert and oriented x 3, normal gait, CN II-XII grossly intact.  MENTAL STATUS:  Appropriate. BACK:  Non-tender to palpation.  No CVAT SKIN:  Warm, dry and intact.    Results: Results for orders placed or performed in visit on 04/27/22 (from the past 24 hour(s))  POCT urinalysis dipstick     Status: Abnormal   Collection Time: 04/27/22 11:04 AM  Result Value Ref Range   Color, UA     Clarity, UA     Glucose, UA Negative Negative   Bilirubin, UA negative    Ketones, UA Trace    Spec Grav, UA 1.025 1.010 - 1.025   Blood, UA Negative    pH, UA 5.0 5.0 - 8.0   Protein, UA Positive (A) Negative   Urobilinogen, UA 0.2 0.2 or 1.0 E.U./dL   Nitrite, UA negative    Leukocytes, UA Small (1+) (A) Negative   Appearance     Odor

## 2022-04-28 ENCOUNTER — Ambulatory Visit: Payer: Medicare Other | Admitting: Family

## 2022-05-02 ENCOUNTER — Telehealth: Payer: Self-pay | Admitting: Family

## 2022-05-02 DIAGNOSIS — R0602 Shortness of breath: Secondary | ICD-10-CM | POA: Diagnosis not present

## 2022-05-02 DIAGNOSIS — R059 Cough, unspecified: Secondary | ICD-10-CM | POA: Diagnosis not present

## 2022-05-02 DIAGNOSIS — E119 Type 2 diabetes mellitus without complications: Secondary | ICD-10-CM | POA: Diagnosis not present

## 2022-05-02 DIAGNOSIS — Z87442 Personal history of urinary calculi: Secondary | ICD-10-CM | POA: Diagnosis not present

## 2022-05-02 DIAGNOSIS — J40 Bronchitis, not specified as acute or chronic: Secondary | ICD-10-CM | POA: Diagnosis not present

## 2022-05-02 DIAGNOSIS — I10 Essential (primary) hypertension: Secondary | ICD-10-CM | POA: Diagnosis not present

## 2022-05-02 NOTE — Telephone Encounter (Signed)
Pt called requesting to speak with nurse regarding his hospital visit. Says looking at his chart, when his CT was done, it was noted that pt may have possible pneumonia but pt was never treated for it.   Has questions. Please advise and call pt.

## 2022-05-02 NOTE — Telephone Encounter (Signed)
He is talking about Abdomen Ct from hospital 11/22 please review and advise

## 2022-05-02 NOTE — Telephone Encounter (Signed)
Patient states he does have a slight cough would like to have a low does antibiotic. Will deep breath and a cough. Please advise

## 2022-05-02 NOTE — Telephone Encounter (Signed)
Does he have any cough or fevers? Matthew Pennington it could be pneumonia or atelectasis. So if not worsening or he is feeling better was more than likely atelectasis.

## 2022-05-03 NOTE — Telephone Encounter (Signed)
Lmtcb 12/05 needs to schedule hospital follow up with [facility]

## 2022-05-03 NOTE — Telephone Encounter (Signed)
PT R/C 

## 2022-05-03 NOTE — Telephone Encounter (Signed)
Appt made patient aware  °

## 2022-05-03 NOTE — Telephone Encounter (Signed)
Pt was seen at ED yesterday.

## 2022-05-05 ENCOUNTER — Ambulatory Visit (INDEPENDENT_AMBULATORY_CARE_PROVIDER_SITE_OTHER): Payer: Medicare Other | Admitting: Pharmacist

## 2022-05-05 DIAGNOSIS — E785 Hyperlipidemia, unspecified: Secondary | ICD-10-CM | POA: Diagnosis not present

## 2022-05-05 DIAGNOSIS — E1169 Type 2 diabetes mellitus with other specified complication: Secondary | ICD-10-CM | POA: Diagnosis not present

## 2022-05-05 DIAGNOSIS — G720 Drug-induced myopathy: Secondary | ICD-10-CM | POA: Diagnosis not present

## 2022-05-05 NOTE — Progress Notes (Signed)
05/05/2022 Name: Matthew Pennington MRN: [id number] DOB: Aug 24, 1965   HPI:  Matthew Pennington is a 56 y.o. male patient referred to lipid clinic by PCP. PMH is significant for Past Medical History:  Diagnosis Date   Diabetes mellitus without complication (HCC)    H/O echocardiogram 09/2017   normal EF   Headache    Hyperlipidemia    Hypertension    Light headedness    OSA on CPAP    Palpitations    Tingling    toes   Trigeminal neuralgia     Current Medications: Current Outpatient Medications on File Prior to Visit  Medication Sig Dispense Refill   Blood Glucose Calibration (ONETOUCH VERIO) High SOLN      Blood Glucose Monitoring Suppl (ONETOUCH VERIO REFLECT) w/Device KIT      cetirizine (ZYRTEC ALLERGY) 10 MG tablet Take 1 tablet (10 mg total) by mouth daily. 90 tablet 1   enalapril (VASOTEC) 20 MG tablet Take 1 tablet (20 mg total) by mouth daily. 90 tablet 0   ezetimibe (ZETIA) 10 MG tablet Take 1 tablet (10 mg total) by mouth daily. 90 tablet 0   gabapentin (NEURONTIN) 300 MG capsule Take 1 capsule (300 mg total) by mouth 3 (three) times daily. 270 capsule 0   glucose blood (ONETOUCH VERIO) test strip      ibuprofen (ADVIL) 800 MG tablet Take 1 tablet (800 mg total) by mouth daily. 90 tablet 0   Lancet Devices (SIMPLE DIAGNOSTICS LANCING DEV) MISC Apply topically.     metFORMIN (GLUCOPHAGE) 500 MG tablet Take 2 tablets (1,000 mg total) by mouth 2 (two) times daily with a meal. 360 tablet 1   nystatin (MYCOSTATIN) 100000 UNIT/ML suspension Take 5 mLs (500,000 Units total) by mouth 4 (four) times daily. 60 mL 0   ondansetron (ZOFRAN-ODT) 4 MG disintegrating tablet 4mg  ODT q4 hours prn nausea/vomit 15 tablet 0   oxcarbazepine (TRILEPTAL) 600 MG tablet Take 1 tablet (600 mg total) by mouth 3 (three) times daily. 270 tablet 2   oxyCODONE-acetaminophen (PERCOCET/ROXICET) 5-325 MG tablet Take 1 tablet by mouth every 6 (six) hours as needed for severe pain. 20 tablet 0   pantoprazole  (PROTONIX) 40 MG tablet Take 1 tablet (40 mg total) by mouth daily. 90 tablet 1   Pharmacist Choice Lancets MISC      rOPINIRole (REQUIP) 0.25 MG tablet TAKE 1 TO 2 TABLETS BY MOUTH AT BEDTIME FOR  RESTLESS  LEGS 180 tablet 1   rosuvastatin (CRESTOR) 5 MG tablet Take 1 tablet (5 mg total) by mouth daily. 90 tablet 3   tamsulosin (FLOMAX) 0.4 MG CAPS capsule Take 1 capsule (0.4 mg total) by mouth daily. 15 capsule 0   traMADol (ULTRAM) 50 MG tablet Take 1 tablet (50 mg total) by mouth daily. 20 tablet 2   No current facility-administered medications on file prior to visit.   Intolerances: "statins"  Risk Factors: obese, HTN, HLD, T2DM  LDL goal: <70   Diet: FOLLOWING A HEART HEALTHY DIET/HEALTHY PLATE METHOD Recommended mediterranean    Exercise: encouraged as able  Family History: n/a Social History: never smoker, non-alcohol  Labs: Lipid Panel     Component Value Date/Time   CHOL 217 (H) 03/03/2022 1059   TRIG 283 (H) 03/03/2022 1059   HDL 30 (L) 03/03/2022 1059   CHOLHDL 7.2 (H) 03/03/2022 1059   LDLCALC 136 (H) 03/03/2022 1059   LABVLDL 51 (H) 03/03/2022 1059    Assessment/Plan:     1. 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

## 2022-05-06 NOTE — Progress Notes (Signed)
[facility] Quality Team Note  Name: Matthew Pennington Date of Birth: July 16, 1965 MRN: [id number] Date: 05/06/2022  [facility] Quality Team has reviewed this patient's chart, please see recommendations below:  [facility] Quality Other; (KED: Kidney Health Evaluation Gap- Patient needs Urine Albumin Creatinine Ratio Test completed for gap closure. EGFR has already been completed, Patient has upcoming appointment with [facility] 05/12/2022).

## 2022-05-12 ENCOUNTER — Telehealth: Payer: Self-pay | Admitting: Family

## 2022-05-12 ENCOUNTER — Encounter: Payer: Self-pay | Admitting: Urology

## 2022-05-12 ENCOUNTER — Ambulatory Visit: Payer: Medicare Other | Admitting: Family

## 2022-05-12 ENCOUNTER — Ambulatory Visit (HOSPITAL_BASED_OUTPATIENT_CLINIC_OR_DEPARTMENT_OTHER)
Admission: RE | Admit: 2022-05-12 | Discharge: 2022-05-12 | Disposition: A | Discharge status: Home / Self Care | Payer: Medicare Other | Source: Ambulatory Visit | Attending: Urology | Admitting: Urology

## 2022-05-12 ENCOUNTER — Ambulatory Visit: Payer: Medicare Other | Admitting: Urology

## 2022-05-12 VITALS — BP 156/93 | HR 74 | Ht 73.0 in | Wt 290.0 lb

## 2022-05-12 DIAGNOSIS — M47814 Spondylosis without myelopathy or radiculopathy, thoracic region: Secondary | ICD-10-CM | POA: Diagnosis not present

## 2022-05-12 DIAGNOSIS — N201 Calculus of ureter: Secondary | ICD-10-CM

## 2022-05-12 DIAGNOSIS — Z87442 Personal history of urinary calculi: Secondary | ICD-10-CM | POA: Diagnosis not present

## 2022-05-12 DIAGNOSIS — N200 Calculus of kidney: Secondary | ICD-10-CM

## 2022-05-12 DIAGNOSIS — I878 Other specified disorders of veins: Secondary | ICD-10-CM | POA: Diagnosis not present

## 2022-05-12 DIAGNOSIS — M47816 Spondylosis without myelopathy or radiculopathy, lumbar region: Secondary | ICD-10-CM | POA: Diagnosis not present

## 2022-05-12 LAB — URINALYSIS
Bilirubin, UA: NEGATIVE
Blood, UA: NEGATIVE
Glucose, UA: 100 mg/dL — AB
Ketones, UA: NEGATIVE
Leukocytes, UA: NEGATIVE
Nitrite, UA: NEGATIVE
Protein, UA: NEGATIVE
Spec Grav, UA: 1.015 (ref 1.010–1.025)
Urobilinogen, UA: 0.2 E.U./dL
pH, UA: 6 (ref 5.0–8.0)

## 2022-05-12 NOTE — Telephone Encounter (Signed)
scheduled

## 2022-05-12 NOTE — Progress Notes (Signed)
Assessment: 1. Ureteral calculus, right   2. Nephrolithiasis     Plan: I personally viewed the KUB study from today.  A 3 mm calcification is visualized in the area of the right proximal ureter consistent with the previously seen stone on CT imaging. I discussed the imaging results with the patient and his wife today.  Options for management discussed. He would like to continue to attempt spontaneous passage. Strain urine Pain medication as needed Follow-up in 2 weeks with KUB Return to office or ER for uncontrolled pain, vomiting, fever, or chills.   Chief Complaint:  Chief Complaint  Patient presents with   ureteral calclulus    History of Present Illness:  Matthew Pennington is a 56 y.o. male who is seen for further evaluation of a right ureteral calculus.  He presented to the emergency room on 04/20/2022 with left sided abdominal pain.  His symptoms began that morning.  He had some associated nausea without vomiting.  No fevers or chills.  No flank pain.  No dysuria or gross hematuria.  CT abdomen and pelvis without contrast showed a small nonobstructive left renal calculus and a 3 x 5 mm calculus at the right UPJ without evidence of obstruction.  His abdominal pain resolved.  At his initial visit on 04/27/22, he was not having any pain in the abdomen or flank.  He had noted some slight dysuria.  He was not aware of passing a stone but had not been straining his urine on a regular basis.  He has been taking tamsulosin but experienced dizziness and nasal congestion. He has a remote history of a kidney stone a number of years ago, passing this spontaneously.  He returns today for scheduled follow-up.  He is not aware of passing a stone.  He has not had any significant right-sided flank pain.  No dysuria or gross hematuria.  He recently completed antibiotics for treatment of pneumonia.  Portions of the above documentation were copied from a prior visit for review purposes only.   Past  Medical History:  Past Medical History:  Diagnosis Date   Diabetes mellitus without complication ([place])    H/O echocardiogram 09/2017   normal EF   Headache    Hyperlipidemia    Hypertension    Light headedness    OSA on CPAP    Palpitations    Tingling    toes   Trigeminal neuralgia     Past Surgical History:  Past Surgical History:  Procedure Laterality Date   NO PAST SURGERIES      Allergies:  Allergies  Allergen Reactions   Morphine And Related Other (See Comments)    "Makes me go Crazy"   Statins Other (See Comments)    Joint/mucles pains    Family History:  Family History  Problem Relation Age of Onset   Diabetes Mother    Hyperlipidemia Mother    Hypertension Mother    Cancer Father    Lung cancer Father    Heart disease Sister     Social History:  Social History   Tobacco Use   Smoking status: Never   Smokeless tobacco: Never  Vaping Use   Vaping Use: Never used  Substance Use Topics   Alcohol use: No   Drug use: No    ROS: Constitutional:  Negative for fever, chills, weight loss CV: Negative for chest pain, previous MI, hypertension Respiratory:  Negative for shortness of breath, wheezing, sleep apnea, frequent cough GI:  Negative for nausea, vomiting,  bloody stool, GERD  Physical exam: BP (!) 156/93   Pulse 74   Ht '6\' 1"'$  (1.854 m)   Wt 290 lb (131.5 kg)   BMI 38.26 kg/m  GENERAL APPEARANCE:  Well appearing, well developed, well nourished, NAD HEENT:  Atraumatic, normocephalic, oropharynx clear NECK:  Supple without lymphadenopathy or thyromegaly ABDOMEN:  Soft, non-tender, no masses EXTREMITIES:  Moves all extremities well, without clubbing, cyanosis, or edema NEUROLOGIC:  Alert and oriented x 3, normal gait, CN II-XII grossly intact MENTAL STATUS:  appropriate BACK:  Non-tender to palpation, No CVAT SKIN:  Warm, dry, and intact  Results: U/A dipstick: trace glucose

## 2022-05-16 ENCOUNTER — Telehealth: Payer: Self-pay | Admitting: Family

## 2022-05-16 NOTE — Telephone Encounter (Signed)
Paperwork faxed °

## 2022-05-16 NOTE — Telephone Encounter (Signed)
Please send all information for CPAP machine to [facility] [phone]  Company that he usually uses is going out of business.

## 2022-05-16 NOTE — Telephone Encounter (Signed)
I have it documented in his chart

## 2022-05-18 ENCOUNTER — Telehealth: Payer: Self-pay | Admitting: Family

## 2022-05-18 DIAGNOSIS — G4733 Obstructive sleep apnea (adult) (pediatric): Secondary | ICD-10-CM

## 2022-05-19 ENCOUNTER — Encounter: Payer: Self-pay | Admitting: Family

## 2022-05-19 ENCOUNTER — Ambulatory Visit (INDEPENDENT_AMBULATORY_CARE_PROVIDER_SITE_OTHER): Payer: Medicare Other | Admitting: Family

## 2022-05-19 VITALS — BP 118/76 | HR 76 | Temp 97.3°F | Ht 73.0 in | Wt 291.8 lb

## 2022-05-19 DIAGNOSIS — J189 Pneumonia, unspecified organism: Secondary | ICD-10-CM

## 2022-05-19 DIAGNOSIS — E1169 Type 2 diabetes mellitus with other specified complication: Secondary | ICD-10-CM

## 2022-05-19 DIAGNOSIS — Z09 Encounter for follow-up examination after completed treatment for conditions other than malignant neoplasm: Secondary | ICD-10-CM

## 2022-05-19 NOTE — Telephone Encounter (Signed)
CPAP order faxed to [facility] at [phone] per fax that came from them

## 2022-05-19 NOTE — Telephone Encounter (Signed)
Please sign pending DME order for supplies

## 2022-05-19 NOTE — Progress Notes (Signed)
Subjective:    Patient ID: Matthew Pennington, male    DOB: March 12, 1966, 56 y.o.   MRN: [id number]  Chief Complaint  Patient presents with   Hospitalization Follow-up   PT presents to the office today for hospital follow up. He was diagnosed with CAP and was given Omnicef and prednisone. Reports he is feeling much better.  Diabetes He presents for his follow-up diabetic visit. He has type 2 diabetes mellitus. Associated symptoms include foot paresthesias. Pertinent negatives for diabetes include no blurred vision. Symptoms are stable. Diabetic complications include peripheral neuropathy. Risk factors for coronary artery disease include dyslipidemia, diabetes mellitus, hypertension, male sex and sedentary lifestyle. He is following a generally healthy diet. His overall blood glucose range is 130-140 mg/dl.      Review of Systems  Eyes:  Negative for blurred vision.  All other systems reviewed and are negative.      Objective:   Physical Exam Vitals reviewed.  Constitutional:      General: He is not in acute distress.    Appearance: He is well-developed. He is obese.  HENT:     Head: Normocephalic.     Right Ear: Tympanic membrane normal.     Left Ear: Tympanic membrane normal.  Eyes:     General:        Right eye: No discharge.        Left eye: No discharge.     Pupils: Pupils are equal, round, and reactive to light.  Neck:     Thyroid: No thyromegaly.  Cardiovascular:     Rate and Rhythm: Normal rate and regular rhythm.     Heart sounds: Normal heart sounds. No murmur heard. Pulmonary:     Effort: Pulmonary effort is normal. No respiratory distress.     Breath sounds: Normal breath sounds. No wheezing.  Abdominal:     General: Bowel sounds are normal. There is no distension.     Palpations: Abdomen is soft.     Tenderness: There is no abdominal tenderness.  Musculoskeletal:        General: No tenderness. Normal range of motion.     Cervical back: Normal range of motion and  neck supple.  Skin:    General: Skin is warm and dry.     Findings: No erythema or rash.  Neurological:     Mental Status: He is alert and oriented to person, place, and time.     Cranial Nerves: No cranial nerve deficit.     Deep Tendon Reflexes: Reflexes are normal and symmetric.  Psychiatric:        Behavior: Behavior normal.        Thought Content: Thought content normal.        Judgment: Judgment normal.       BP 118/76   Pulse 76   Temp (!) 97.3 F (36.3 C) (Temporal)   Ht 6\' 1"  (1.854 m)   Wt 291 lb 12.8 oz (132.4 kg)   SpO2 95%   BMI 38.50 kg/m      Assessment & Plan:  Matthew Pennington comes in today with chief complaint of Hospitalization Follow-up   Diagnosis and orders addressed:  1. Type 2 diabetes mellitus with other specified complication, without long-term current use of insulin (HCC) - Microalbumin / creatinine urine ratio - CMP14+EGFR - CBC with Differential/Platelet  2. Hospital discharge follow-up - CMP14+EGFR - CBC with Differential/Platelet  3. Community acquired pneumonia, unspecified laterality - CMP14+EGFR - CBC with Differential/Platelet   Labs pending  Health Maintenance reviewed Diet and exercise encouraged  Follow up plan: Keep chronic follow up   [name], FNP

## 2022-05-19 NOTE — Patient Instructions (Signed)
Community-Acquired Pneumonia, Adult Pneumonia is a lung infection that causes inflammation and the buildup of mucus and fluids in the lungs. This may cause coughing and difficulty breathing. Community-acquired pneumonia is pneumonia that develops in people who are not, and have not recently been, in a hospital or other health care facility. Usually, pneumonia develops as a result of an illness that is caused by a virus, such as the common cold and the flu (influenza). It can also be caused by bacteria or fungi. While the common cold and influenza can pass from person to person (are contagious), pneumonia itself is not considered contagious. What are the causes? This condition may be caused by: Viruses. Bacteria. Fungi. What increases the risk? The following factors may make you more likely to develop this condition: Being over age 65 or having certain medical conditions, such as: A long-term (chronic) disease, such as: chronic obstructive pulmonary disease (COPD), asthma, heart failure, diabetes, or kidney disease. A condition that increases the risk of breathing in (aspirating) mucus and other fluids from your mouth and nose. A weakened body defense system (immune system). Having had your spleen removed (splenectomy). The spleen is the organ that helps fight germs and infections. Not cleaning your teeth and gums well (poor dental hygiene). Using tobacco products. Traveling to places where germs that cause pneumonia are present or being near certain animals or animal habitats that could have germs that cause pneumonia. What are the signs or symptoms? Symptoms of this condition include: A dry cough or a wet (productive) cough. A fever, sweating, or chills. Chest pain, especially when breathing deeply or coughing. Fast breathing, difficulty breathing, or shortness of breath. Tiredness (fatigue) and muscle aches. How is this diagnosed? This condition may be diagnosed based on your medical  history or a physical exam. You may also have tests, including: Imaging, such as a chest X-ray or lung ultrasound. Tests of: The level of oxygen and other gases in your blood. Mucus from your lungs (sputum). Fluid around your lungs (pleural fluid). Your urine. How is this treated? Treatment for this condition depends on many factors, such as the cause of your pneumonia, your medicines, and other medical conditions that you have. For most adults, pneumonia may be treated at home. In some cases, treatment must happen in a hospital and may include: Medicines that are given by mouth (orally) or through an IV, including: Antibiotic medicines, if bacteria caused the pneumonia. Medicines that kill viruses (antiviral medicines), if a virus caused the pneumonia. Oxygen therapy. Severe pneumonia, although rare, may require the following treatments: Mechanical ventilation.This procedure uses a machine to help you breathe if you cannot breathe well on your own or maintain a safe level of blood oxygen. Thoracentesis. This procedure removes any buildup of pleural fluid to help with breathing. Follow these instructions at home:  Medicines Take over-the-counter and prescription medicines only as told by your health care provider. Take cough medicine only if you have trouble sleeping. Cough medicine can prevent your body from removing mucus from your lungs. If you were prescribed antibiotics, take them as told by your health care provider. Do not stop taking the antibiotic even if you start to feel better. Lifestyle     Do not drink alcohol. Do not use any products that contain nicotine or tobacco. These products include cigarettes, chewing tobacco, and vaping devices, such as e-cigarettes. If you need help quitting, ask your health care provider. Eat a healthy diet. This includes plenty of vegetables, fruits, whole grains, low-fat   dairy products, and lean protein. General instructions Rest a lot and  get at least 8 hours of sleep each night. Sleep in a partly upright position at night. Place a few pillows under your head or sleep in a reclining chair. Return to your normal activities as told by your health care provider. Ask your health care provider what activities are safe for you. Drink enough fluid to keep your urine pale yellow. This helps to thin the mucus in your lungs. If your throat is sore, gargle with a mixture of salt and water 3-4 times a day or as needed. To make salt water, completely dissolve -1 tsp (3-6 g) of salt in 1 cup (237 mL) of warm water. Keep all follow-up visits. How is this prevented? You can lower your risk of developing community-acquired pneumonia by: Getting the pneumonia vaccine. There are different types and schedules of pneumonia vaccines. Ask your health care provider which option is best for you. Consider getting the pneumonia vaccine if: You are older than 56 years of age. You are 19-65 years of age and are receiving cancer treatment, have chronic lung disease, or have other medical conditions that affect your immune system. Ask your health care provider if this applies to you. Getting your influenza vaccine every year. Ask your health care provider which type of vaccine is best for you. Getting regular dental checkups. Washing your hands often with soap and water for at least 20 seconds. If soap and water are not available, use hand sanitizer. Contact a health care provider if: You have a fever. You have trouble sleeping because you cannot control your cough with cough medicine. Get help right away if: Your shortness of breath becomes worse. Your chest pain increases. Your sickness becomes worse, especially if you are an older adult or have a weak immune system. You cough up blood. These symptoms may be an emergency. Get help right away. Call 911. Do not wait to see if the symptoms will go away. Do not drive yourself to the  hospital. Summary Pneumonia is an infection of the lungs. Community-acquired pneumonia develops in people who have not been in the hospital. It can be caused by bacteria, viruses, or fungi. This condition may be treated with antibiotics or antiviral medicines. Severe pneumonia may require a hospital stay and treatment to help with breathing. This information is not intended to replace advice given to you by your health care provider. Make sure you discuss any questions you have with your health care provider. Document Revised: 07/14/2021 Document Reviewed: 07/14/2021 Elsevier Patient Education  2023 [employer].  

## 2022-05-19 NOTE — Telephone Encounter (Signed)
CPAP supplies signed.   Evelina Dun, FNP

## 2022-05-20 LAB — CBC WITH DIFFERENTIAL/PLATELET
Basophils Absolute: 0.1 10*3/uL (ref 0.0–0.2)
Basos: 1 %
EOS (ABSOLUTE): 0.2 10*3/uL (ref 0.0–0.4)
Eos: 3 %
Hematocrit: 47.3 % (ref 37.5–51.0)
Hemoglobin: 15.3 g/dL (ref 13.0–17.7)
Immature Grans (Abs): 0 10*3/uL (ref 0.0–0.1)
Immature Granulocytes: 0 %
Lymphocytes Absolute: 2.9 10*3/uL (ref 0.7–3.1)
Lymphs: 40 %
MCH: 29.9 pg (ref 26.6–33.0)
MCHC: 32.3 g/dL (ref 31.5–35.7)
MCV: 92 fL (ref 79–97)
Monocytes Absolute: 0.5 10*3/uL (ref 0.1–0.9)
Monocytes: 7 %
Neutrophils Absolute: 3.6 10*3/uL (ref 1.4–7.0)
Neutrophils: 49 %
Platelets: 252 10*3/uL (ref 150–450)
RBC: 5.12 x10E6/uL (ref 4.14–5.80)
RDW: 13.1 % (ref 11.6–15.4)
WBC: 7.2 10*3/uL (ref 3.4–10.8)

## 2022-05-20 LAB — CMP14+EGFR
ALT: 76 IU/L — ABNORMAL HIGH (ref 0–44)
AST: 46 IU/L — ABNORMAL HIGH (ref 0–40)
Albumin/Globulin Ratio: 2 (ref 1.2–2.2)
Albumin: 4.7 g/dL (ref 3.8–4.9)
Alkaline Phosphatase: 62 IU/L (ref 44–121)
BUN/Creatinine Ratio: 10 (ref 9–20)
BUN: 10 mg/dL (ref 6–24)
Bilirubin Total: 0.3 mg/dL (ref 0.0–1.2)
CO2: 22 mmol/L (ref 20–29)
Calcium: 10 mg/dL (ref 8.7–10.2)
Chloride: 102 mmol/L (ref 96–106)
Creatinine, Ser: 1.03 mg/dL (ref 0.76–1.27)
Globulin, Total: 2.3 g/dL (ref 1.5–4.5)
Glucose: 196 mg/dL — ABNORMAL HIGH (ref 70–99)
Potassium: 4.6 mmol/L (ref 3.5–5.2)
Sodium: 142 mmol/L (ref 134–144)
Total Protein: 7 g/dL (ref 6.0–8.5)
eGFR: 85 mL/min/{1.73_m2} (ref >59)

## 2022-05-26 ENCOUNTER — Other Ambulatory Visit (INDEPENDENT_AMBULATORY_CARE_PROVIDER_SITE_OTHER): Payer: Medicare Other

## 2022-05-26 ENCOUNTER — Other Ambulatory Visit: Payer: Self-pay | Admitting: Family Medicine

## 2022-05-26 DIAGNOSIS — J189 Pneumonia, unspecified organism: Secondary | ICD-10-CM

## 2022-05-26 DIAGNOSIS — Z0389 Encounter for observation for other suspected diseases and conditions ruled out: Secondary | ICD-10-CM | POA: Diagnosis not present

## 2022-05-31 ENCOUNTER — Ambulatory Visit: Payer: Medicare Other | Admitting: Urology

## 2022-05-31 DIAGNOSIS — N201 Calculus of ureter: Secondary | ICD-10-CM

## 2022-06-02 ENCOUNTER — Encounter: Payer: Self-pay | Admitting: Urology

## 2022-06-02 ENCOUNTER — Ambulatory Visit (HOSPITAL_BASED_OUTPATIENT_CLINIC_OR_DEPARTMENT_OTHER)
Admission: RE | Admit: 2022-06-02 | Discharge: 2022-06-02 | Disposition: A | Discharge status: Home / Self Care | Payer: Medicare Other | Source: Ambulatory Visit | Attending: Urology | Admitting: Urology

## 2022-06-02 ENCOUNTER — Other Ambulatory Visit: Payer: Self-pay | Admitting: Urology

## 2022-06-02 ENCOUNTER — Ambulatory Visit: Payer: Medicare Other | Admitting: Urology

## 2022-06-02 VITALS — BP 129/87 | HR 63 | Ht 73.0 in | Wt 290.0 lb

## 2022-06-02 DIAGNOSIS — N201 Calculus of ureter: Secondary | ICD-10-CM

## 2022-06-02 DIAGNOSIS — N200 Calculus of kidney: Secondary | ICD-10-CM

## 2022-06-02 NOTE — Addendum Note (Signed)
Addended by: Primus Bravo on: 06/02/2022 11:13 AM   Modules accepted: Orders

## 2022-06-02 NOTE — Progress Notes (Addendum)
Assessment: 1. Ureteral calculus, right   2. Nephrolithiasis     Plan: I personally viewed the KUB study from today.  A 3-4 mm calcification is visualized in the area of the right proximal ureter consistent with the previously seen stone on CT imaging. I discussed the imaging results with the patient and his wife today.  Options for management discussed. He will to schedule shockwave lithotripsy for management. He would like to schedule for 2 weeks Return to office on 06/09/22 with KUB  Procedure: The patient will be scheduled for Right ESL at [facility].  Surgical request is placed with the surgery schedulers and will be scheduled at the patient's/family request. Informed consent is given as documented below. Anesthesia:  local  The patient does not have sleep apnea, history of MRSA, history of VRE, history of cardiac device requiring special anesthetic needs. Patient is stable and considered clear for surgical in an outpatient ambulatory surgery setting as well as patient hospital setting.  Consent for Operation or Procedure: Provider Certification I hereby certify that the nature, purpose, benefits, usual and most frequent risks of, and alternatives to, the operation or procedure have been explained to the patient (or person authorized to sign for the patient) either by me as responsible physician or by the provider who is to perform the operation or procedure. Time spent such that the patient/family has had an opportunity to ask questions, and that those questions have been answered. The patient or the patient's representative has been advised that selected tasks may be performed by assistants to the primary health care provider(s). I believe that the patient (or person authorized to sign for the patient) understands what has been explained, and has consented to the operation or procedure. No guarantees were implied or made.   Chief Complaint:  Chief Complaint  Patient presents with    ureteral calculus    History of Present Illness:  Matthew Pennington is a 57 y.o. male who is seen for further evaluation of a right ureteral calculus.  He presented to the emergency room on 04/20/2022 with left sided abdominal pain.  His symptoms began that morning.  He had some associated nausea without vomiting.  No fevers or chills.  No flank pain.  No dysuria or gross hematuria.  CT abdomen and pelvis without contrast showed a small nonobstructive left renal calculus and a 3 x 5 mm calculus at the right UPJ without evidence of obstruction.  His abdominal pain resolved.  At his initial visit on 04/27/22, he was not having any pain in the abdomen or flank.  He had noted some slight dysuria.  He was not aware of passing a stone but had not been straining his urine on a regular basis.  He has been taking tamsulosin but experienced dizziness and nasal congestion. He has a remote history of a kidney stone a number of years ago, passing this spontaneously. KUB from 05/12/2022 showed the 5 mm right UPJ calculus unchanged in position.  He was not having any right-sided flank pain at that time.  He wanted to continue to attempt spontaneous passage.  He was in today for follow-up.  He has not passed a stone.  He is not having any significant flank pain.  No dysuria or gross hematuria.  Portions of the above documentation were copied from a prior visit for review purposes only.   Past Medical History:  Past Medical History:  Diagnosis Date   Diabetes mellitus without complication ([place])    H/O  echocardiogram 09/2017   normal EF   Headache    Hyperlipidemia    Hypertension    Light headedness    OSA on CPAP    Palpitations    Tingling    toes   Trigeminal neuralgia     Past Surgical History:  Past Surgical History:  Procedure Laterality Date   NO PAST SURGERIES      Allergies:  Allergies  Allergen Reactions   Morphine And Related Other (See Comments)    "Makes me go Crazy"   Statins  Other (See Comments)    Joint/mucles pains    Family History:  Family History  Problem Relation Age of Onset   Diabetes Mother    Hyperlipidemia Mother    Hypertension Mother    Cancer Father    Lung cancer Father    Heart disease Sister     Social History:  Social History   Tobacco Use   Smoking status: Never   Smokeless tobacco: Never  Vaping Use   Vaping Use: Never used  Substance Use Topics   Alcohol use: No   Drug use: No    ROS: Constitutional:  Negative for fever, chills, weight loss CV: Negative for chest pain, previous MI, hypertension Respiratory:  Negative for shortness of breath, wheezing, sleep apnea, frequent cough GI:  Negative for nausea, vomiting, bloody stool, GERD  Physical exam: BP 129/87   Pulse 63   Ht '6\' 1"'$  (1.854 m)   Wt 290 lb (131.5 kg)   BMI 38.26 kg/m  GENERAL APPEARANCE:  Well appearing, well developed, well nourished, NAD HEENT:  Atraumatic, normocephalic, oropharynx clear NECK:  Supple without lymphadenopathy or thyromegaly ABDOMEN:  Soft, non-tender, no masses EXTREMITIES:  Moves all extremities well, without clubbing, cyanosis, or edema NEUROLOGIC:  Alert and oriented x 3, normal gait, CN II-XII grossly intact MENTAL STATUS:  appropriate BACK:  Non-tender to palpation, No CVAT SKIN:  Warm, dry, and intact  Results: U/A dipstick: trace glucose

## 2022-06-02 NOTE — Progress Notes (Signed)
I called the patient to discuss surgery dates- patient wishes to wait for 2-3 weeks and will call back to Dr. Felipa Eth office if patient wishes to proceed. Patient stated he would like to see if he is able to pass the stone and is going out of town as well.   If patient returns call and wishes to proceed, please send message back to have ESWL scheduled.

## 2022-06-02 NOTE — Progress Notes (Unsigned)
Surgical Physician Order Form [facility]  * Scheduling expectation :  06/06/22  *Length of Case: 60 minutes  *MD Preforming Case: Michaelle Birks, MD  *Assistant Needed: no  *Facility Preference:  [facility]  *Clearance needed: no  *Anticoagulation Instructions: N/A  *Aspirin Instructions: N/A  -Admit type: OUTpatient  -Anesthesia: Local  -Use Standing Orders: ESWL  *Diagnosis: Right Ureteral Stone  *Procedure: right  ESL  Additional orders: N/A  -Equipment:  none -VTE Prophylaxis Standing Order SCD's       Other:   -Standing Lab Orders Per Anesthesia    Lab other: None  -Standing Test orders EKG/Chest x-ray per Anesthesia       Test other:   - Medications:   none  -Other orders:  N/A  *Post-op visit Date/Instructions:  1-2 week with KUB prior

## 2022-06-03 LAB — URINALYSIS
Bilirubin: NEGATIVE
Blood: NEGATIVE
Ketones: NEGATIVE
Leukocytes, UA: NEGATIVE
Nitrite, UA: NEGATIVE
Protein, UA: NEGATIVE
Specific Gravity: 1.02
Urobilinogen, UA: 0.2 E.U./dL
pH: 6

## 2022-06-07 ENCOUNTER — Telehealth: Payer: Self-pay | Admitting: Family

## 2022-06-07 NOTE — Telephone Encounter (Signed)
Can we addened note from last visit for CPAP supplies?

## 2022-06-10 ENCOUNTER — Ambulatory Visit (INDEPENDENT_AMBULATORY_CARE_PROVIDER_SITE_OTHER): Payer: Medicare Other | Admitting: Family

## 2022-06-10 ENCOUNTER — Encounter: Payer: Self-pay | Admitting: Family

## 2022-06-10 ENCOUNTER — Ambulatory Visit (INDEPENDENT_AMBULATORY_CARE_PROVIDER_SITE_OTHER): Payer: Medicare Other

## 2022-06-10 VITALS — BP 121/78 | HR 82 | Temp 97.7°F | Ht 73.0 in | Wt 290.0 lb

## 2022-06-10 DIAGNOSIS — G4733 Obstructive sleep apnea (adult) (pediatric): Secondary | ICD-10-CM

## 2022-06-10 DIAGNOSIS — U071 COVID-19: Secondary | ICD-10-CM

## 2022-06-10 MED ORDER — PROMETHAZINE-DM 6.25-15 MG/5ML PO SYRP: 5.0000 mL | ORAL_SOLUTION | Freq: Three times a day (TID) | ORAL | 0 refills | Status: DC | PRN

## 2022-06-10 MED ORDER — BENZONATATE 200 MG PO CAPS: 200.0000 mg | ORAL_CAPSULE | Freq: Three times a day (TID) | ORAL | 1 refills | Status: DC | PRN

## 2022-06-10 NOTE — Progress Notes (Addendum)
Subjective:    Patient ID: Matthew Pennington, male    DOB: 1965-07-20, 57 y.o.   MRN: [id number]  Chief Complaint  Patient presents with   Covid Positive   PT presents to the office today with COVID. He tested positive 06/06/22 and his symptoms started 06/05/22. He reports he is feeling better now. However, he had pneumonia a few weeks ago and is worried about this going to his chest.   PT also has OSA and uses CPAP nightly and tolerating well. Requesting rx for supplies today.  URI  This is a new problem. The current episode started in the past 7 days. The problem has been gradually worsening. Maximum temperature: improved. Associated symptoms include congestion, ear pain, headaches (improved), joint pain, rhinorrhea, sinus pain and sneezing. Pertinent negatives include no sore throat. He has tried decongestant for the symptoms. The treatment provided mild relief.      Review of Systems  HENT:  Positive for congestion, ear pain, rhinorrhea, sinus pain and sneezing. Negative for sore throat.   Musculoskeletal:  Positive for joint pain.  Neurological:  Positive for headaches (improved).  All other systems reviewed and are negative.      Objective:   Physical Exam Vitals reviewed.  Constitutional:      General: He is not in acute distress.    Appearance: He is well-developed. He is obese.  HENT:     Head: Normocephalic.     Right Ear: A middle ear effusion is present.     Left Ear: A middle ear effusion is present.  Eyes:     General:        Right eye: No discharge.        Left eye: No discharge.     Pupils: Pupils are equal, round, and reactive to light.  Neck:     Thyroid: No thyromegaly.  Cardiovascular:     Rate and Rhythm: Normal rate and regular rhythm.     Heart sounds: Normal heart sounds. No murmur heard. Pulmonary:     Effort: Pulmonary effort is normal. No respiratory distress.     Breath sounds: Normal breath sounds. No wheezing.  Abdominal:     General: Bowel  sounds are normal. There is no distension.     Palpations: Abdomen is soft.     Tenderness: There is no abdominal tenderness.  Musculoskeletal:        General: No tenderness. Normal range of motion.     Cervical back: Normal range of motion and neck supple.  Skin:    General: Skin is warm and dry.     Findings: No erythema or rash.  Neurological:     Mental Status: He is alert and oriented to person, place, and time.     Cranial Nerves: No cranial nerve deficit.     Deep Tendon Reflexes: Reflexes are normal and symmetric.  Psychiatric:        Behavior: Behavior normal.        Thought Content: Thought content normal.        Judgment: Judgment normal.       BP 121/78   Pulse 82   Temp 97.7 F (36.5 C) (Temporal)   Ht 6\' 1"  (1.854 m)   Wt 290 lb (131.5 kg)   SpO2 95%   BMI 38.26 kg/m      Assessment & Plan:  Matthew Pennington comes in today with chief complaint of Covid Positive   Diagnosis and orders addressed:  1. COVID-19 COVID positive,  rest, force fluids, tylenol as needed, Quarantine for at least 5 days and you are fever free, then must wear a mask out in public from day 6-10, report any worsening symptoms such as increased shortness of breath, swelling, or continued high fevers. Does not want antivirals at this time.  - DG Chest 2 View - benzonatate (TESSALON) 200 MG capsule; Take 1 capsule (200 mg total) by mouth 3 (three) times daily as needed.  Dispense: 30 capsule; Refill: 1 - promethazine-dextromethorphan (PROMETHAZINE-DM) 6.25-15 MG/5ML syrup; Take 5 mLs by mouth 3 (three) times daily as needed for cough.  Dispense: 118 mL; Refill: 0   2. OSA (obstructive sleep apnea)   [name], FNP

## 2022-06-10 NOTE — Patient Instructions (Signed)

## 2022-06-10 NOTE — Telephone Encounter (Signed)
Done.  Evelina Dun, FNP

## 2022-06-13 ENCOUNTER — Telehealth: Payer: Self-pay | Admitting: Family

## 2022-06-13 DIAGNOSIS — G4733 Obstructive sleep apnea (adult) (pediatric): Secondary | ICD-10-CM

## 2022-06-13 NOTE — Telephone Encounter (Signed)
Tye [place] can you print this order it will  not let me a christy

## 2022-06-13 NOTE — Telephone Encounter (Signed)
06/10/22 OV notes routed via [employer] to [facility] ATTN: CPAP Supplies at fax# [phone]

## 2022-06-13 NOTE — Telephone Encounter (Signed)
Please fax

## 2022-06-13 NOTE — Telephone Encounter (Signed)
Please print

## 2022-06-14 NOTE — Telephone Encounter (Signed)
Faxed patient aware

## 2022-06-14 NOTE — Telephone Encounter (Signed)
Please fax clinical notes to [employer] [facility] for cpap supplies Fax [phone]

## 2022-06-15 DIAGNOSIS — G4733 Obstructive sleep apnea (adult) (pediatric): Secondary | ICD-10-CM | POA: Diagnosis not present

## 2022-06-20 DIAGNOSIS — H35033 Hypertensive retinopathy, bilateral: Secondary | ICD-10-CM | POA: Diagnosis not present

## 2022-06-21 NOTE — Progress Notes (Unsigned)
Office Visit Note  Patient: Matthew Pennington             Date of Birth: 04-08-1966           MRN: [id number]             PCP: Junie Spencer, FNP Referring: Junie Spencer, FNP Visit Date: 06/22/2022 Occupation: @GUAROCC @  Subjective:  No chief complaint on file.   History of Present Illness: Aldahir Gordillo is a 57 y.o. male here for evaluation with chronic joint pains and concern for fibromyalgia syndrome. He has episodic pain related to trigeminal neuralgia but also musculoskeletal pain with low back pain, bilateral lateral epicondylitis, and some generalized pain and weakness. 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 Negative labs screening for RA and gout in 03/2020.***     Activities of Daily Living:  Patient reports morning stiffness for *** {minute/hour:19697}.   Patient {ACTIONS;DENIES/REPORTS:21021675::"Denies"} nocturnal pain.  Difficulty dressing/grooming: {ACTIONS;DENIES/REPORTS:21021675::"Denies"} Difficulty climbing stairs: {ACTIONS;DENIES/REPORTS:21021675::"Denies"} Difficulty getting out of chair: {ACTIONS;DENIES/REPORTS:21021675::"Denies"} Difficulty using hands for taps, buttons, cutlery, and/or writing: {ACTIONS;DENIES/REPORTS:21021675::"Denies"}  No Rheumatology ROS completed.   PMFS History:  Patient Active Problem List   Diagnosis Date Noted   Ureteral calculus, right 04/27/2022   Nephrolithiasis 04/27/2022   Statin myopathy 03/03/2022   Right lateral epicondylitis 02/11/2022   Left lateral epicondylitis 02/11/2022   Arthritis of carpometacarpal (CMC) joint of right thumb 06/08/2021   Hepatic steatosis 12/24/2019   Gastroesophageal reflux disease 08/23/2019   Controlled substance agreement signed 08/23/2019   Moderate recurrent major depression (HCC) 08/23/2019   OSA (obstructive sleep apnea) 12/05/2017   GAD (generalized anxiety disorder) 11/24/2017   Leg cramps, sleep related 11/24/2017   Hypertension  associated with diabetes (HCC) 03/29/2017   Diabetes mellitus (HCC) 03/29/2017   Hyperlipidemia associated with type 2 diabetes mellitus (HCC) 03/29/2017   Diverticulosis of large intestine without hemorrhage 03/29/2017   History of prostatitis 03/29/2017   Light headedness    Tingling    Weakness    Trigeminal neuralgia     Past Medical History:  Diagnosis Date   Diabetes mellitus without complication (HCC)    H/O echocardiogram 09/2017   normal EF   Headache    Hyperlipidemia    Hypertension    Light headedness    OSA on CPAP    Palpitations    Tingling    toes   Trigeminal neuralgia     Family History  Problem Relation Age of Onset   Diabetes Mother    Hyperlipidemia Mother    Hypertension Mother    Cancer Father    Lung cancer Father    Heart disease Sister    Past Surgical History:  Procedure Laterality Date   NO PAST SURGERIES     Social History   Social History Narrative   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   Immunization History  Administered Date(s) Administered   Influenza Split 02/13/2017   Influenza,inj,Quad PF,6+ Mos 02/21/2022   Influenza,inj,quad, With Preservative 02/13/2017, 02/20/2018, 02/05/2019   Influenza-Unspecified 01/29/2020, 03/30/2021     Objective: Vital Signs: There were no vitals taken for this visit.   Physical Exam   Musculoskeletal Exam: ***  CDAI Exam: CDAI Score: -- Patient Global: --; Provider Global: -- Swollen: --; Tender: -- Joint Exam 06/22/2022   No joint exam has been documented for this visit   There is currently no  information documented on the homunculus. Go to the Rheumatology activity and complete the homunculus joint exam.  Investigation: No additional findings.  Imaging: DG Chest 2 View  Result Date: 06/12/2022 CLINICAL DATA:  COVID-19 infection. EXAM: CHEST - 2 VIEW COMPARISON:  05/26/2022 FINDINGS: The heart size and mediastinal  contours are within normal limits. Both lungs are clear. The visualized skeletal structures are unremarkable. IMPRESSION: No active cardiopulmonary disease. Electronically Signed   By: Danae Orleans M.D.   On: 06/12/2022 10:24   Abdomen 1 view (KUB)  Result Date: 06/03/2022 CLINICAL DATA:  History of right ureteral calculus EXAM: ABDOMEN - 1 VIEW COMPARISON:  05/12/2022 FINDINGS: Scattered large and small bowel gas is noted. No abnormal mass is seen. Small calcification is noted in the proximal right ureter stable in appearance from the prior exam. No other focal abnormality is noted. IMPRESSION: Stable right ureteral stone. Electronically Signed   By: Alcide Clever M.D.   On: 06/03/2022 03:09   DG Chest 2 View  Result Date: 05/28/2022 CLINICAL DATA:  Rule out pneumonia. EXAM: CHEST - 2 VIEW COMPARISON:  May 02, 2022 FINDINGS: The heart size and mediastinal contours are within normal limits. Low lung volumes are noted. Both lungs are clear. Multilevel degenerative changes seen throughout the thoracic spine. IMPRESSION: No active cardiopulmonary disease. Electronically Signed   By: Aram Candela M.D.   On: 05/28/2022 22:10    Recent Labs: Lab Results  Component Value Date   WBC 7.2 05/19/2022   HGB 15.3 05/19/2022   PLT 252 05/19/2022   NA 142 05/19/2022   K 4.6 05/19/2022   CL 102 05/19/2022   CO2 22 05/19/2022   GLUCOSE 196 (H) 05/19/2022   BUN 10 05/19/2022   CREATININE 1.03 05/19/2022   BILITOT 0.3 05/19/2022   ALKPHOS 62 05/19/2022   AST 46 (H) 05/19/2022   ALT 76 (H) 05/19/2022   PROT 7.0 05/19/2022   ALBUMIN 4.7 05/19/2022   CALCIUM 10.0 05/19/2022   GFRAA 103 04/09/2020    Speciality Comments: No specialty comments available.  Procedures:  No procedures performed Allergies: Morphine and related and Statins   Assessment / Plan:     Visit Diagnoses: No diagnosis found.  Orders: No orders of the defined types were placed in this encounter.  No orders of the  defined types were placed in this encounter.   Face-to-face time spent with patient was *** minutes. Greater than 50% of time was spent in counseling and coordination of care.  Follow-Up Instructions: No follow-ups on file.   Fuller Plan, MD  Note - This record has been created using [employer].  Chart creation errors have been sought, but may not always  have been located. Such creation errors do not reflect on  the standard of medical care.

## 2022-06-22 ENCOUNTER — Encounter: Payer: Self-pay | Admitting: Internal Medicine

## 2022-06-22 ENCOUNTER — Ambulatory Visit: Payer: Medicare Other | Attending: Internal Medicine | Admitting: Internal Medicine

## 2022-06-22 ENCOUNTER — Ambulatory Visit: Payer: Medicare Other

## 2022-06-22 ENCOUNTER — Ambulatory Visit (INDEPENDENT_AMBULATORY_CARE_PROVIDER_SITE_OTHER): Payer: Medicare Other

## 2022-06-22 VITALS — BP 112/80 | HR 97 | Resp 14 | Ht 71.0 in | Wt 293.0 lb

## 2022-06-22 DIAGNOSIS — M25511 Pain in right shoulder: Secondary | ICD-10-CM

## 2022-06-22 DIAGNOSIS — M1811 Unilateral primary osteoarthritis of first carpometacarpal joint, right hand: Secondary | ICD-10-CM | POA: Diagnosis not present

## 2022-06-22 DIAGNOSIS — M7711 Lateral epicondylitis, right elbow: Secondary | ICD-10-CM

## 2022-06-22 DIAGNOSIS — M2550 Pain in unspecified joint: Secondary | ICD-10-CM | POA: Diagnosis not present

## 2022-06-22 DIAGNOSIS — M25512 Pain in left shoulder: Secondary | ICD-10-CM

## 2022-06-22 DIAGNOSIS — G8929 Other chronic pain: Secondary | ICD-10-CM

## 2022-06-22 DIAGNOSIS — M7712 Lateral epicondylitis, left elbow: Secondary | ICD-10-CM

## 2022-06-24 ENCOUNTER — Other Ambulatory Visit: Payer: Self-pay | Admitting: Family

## 2022-06-24 LAB — RHEUMATOID FACTOR: Rheumatoid fact SerPl-aCnc: <14 IU/mL (ref <14)

## 2022-06-24 LAB — SEDIMENTATION RATE: Sed Rate: 22 mm/h — ABNORMAL HIGH (ref 0–20)

## 2022-06-24 LAB — CYCLIC CITRUL PEPTIDE ANTIBODY, IGG: Cyclic Citrullin Peptide Ab: <16 UNITS

## 2022-06-24 LAB — C-REACTIVE PROTEIN: CRP: 0.6 mg/L (ref <8.0)

## 2022-06-30 LAB — HM DIABETES EYE EXAM

## 2022-07-08 DIAGNOSIS — G4733 Obstructive sleep apnea (adult) (pediatric): Secondary | ICD-10-CM | POA: Diagnosis not present

## 2022-07-16 ENCOUNTER — Other Ambulatory Visit: Payer: Self-pay | Admitting: Family

## 2022-07-16 DIAGNOSIS — E1159 Type 2 diabetes mellitus with other circulatory complications: Secondary | ICD-10-CM

## 2022-07-25 DIAGNOSIS — M47812 Spondylosis without myelopathy or radiculopathy, cervical region: Secondary | ICD-10-CM | POA: Diagnosis not present

## 2022-07-25 DIAGNOSIS — M47816 Spondylosis without myelopathy or radiculopathy, lumbar region: Secondary | ICD-10-CM | POA: Diagnosis not present

## 2022-07-25 DIAGNOSIS — S29012A Strain of muscle and tendon of back wall of thorax, initial encounter: Secondary | ICD-10-CM | POA: Diagnosis not present

## 2022-07-25 DIAGNOSIS — S338XXA Sprain of other parts of lumbar spine and pelvis, initial encounter: Secondary | ICD-10-CM | POA: Diagnosis not present

## 2022-07-25 DIAGNOSIS — M9903 Segmental and somatic dysfunction of lumbar region: Secondary | ICD-10-CM | POA: Diagnosis not present

## 2022-07-25 DIAGNOSIS — M9901 Segmental and somatic dysfunction of cervical region: Secondary | ICD-10-CM | POA: Diagnosis not present

## 2022-07-25 DIAGNOSIS — M542 Cervicalgia: Secondary | ICD-10-CM | POA: Diagnosis not present

## 2022-07-25 DIAGNOSIS — M9902 Segmental and somatic dysfunction of thoracic region: Secondary | ICD-10-CM | POA: Diagnosis not present

## 2022-07-25 NOTE — Progress Notes (Unsigned)
Office Visit Note  Patient: Matthew Pennington             Date of Birth: 1965/11/23           MRN: [id number]             PCP: Junie Spencer, FNP Referring: Junie Spencer, FNP Visit Date: 07/26/2022   Subjective:  No chief complaint on file.   History of Present Illness: Matthew Pennington is a 57 y.o. male here for follow up ***   Previous HPI 06/22/22 Matthew Pennington is a 57 y.o. male here for evaluation with chronic joint pains and concern for fibromyalgia syndrome. He has chronic musculoskeletal pain with low back pain, bilateral lateral epicondylitis, and some generalized pain and weakness.  He has had significant joint pain for several years but this is particularly bothersome for about the past 1 year.  Has really had pain and soreness both localized to the joints and pretty diffusely at times.  Shoulder pain is particularly worse with having to reach overhead or weightbearing to pick up any kind of heavy object.  Also experiences pain at the shoulders and hips if there is pressure on these areas while sleeping worse on his left side than the right.  He has had repeated episodes with pain in both elbows both with use and severe tenderness to any pressure on the area.  He also frequently has pain in his hands occasionally with some swelling or increased fluid.  Frequently has pain in his low back and knees with some existing osteoarthritis.  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 He has peripheral neuropathy in bilateral feet more severe on the left side. He has episodic pain related to trigeminal neuralgia this has been excruciating in severity but had a good improvement with addition of gabapentin and oxcarbazepine.  He experiences occasional dizziness or lightheadedness but not always associated with positional change.  He does not have many headaches outside of  the trigeminal neuralgia pain. 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 Negative labs screening for RA and gout in 03/2020. Outside of the specific complaints he has generally felt less well including poor mood and irritability.  He sleeps with CPAP but also has nighttime awakenings on most nights without a specific cause that he can tell.  He has some baseline degree of fatigue on most days also experiences episodes of taking up to 1 or 2 days to recover after increase in physical exertion on any particular day's activity.  He has had frequent issues with diarrhea and loose stools not associated with any kind of abdominal pain mucus or blood in his bowel movements.     No Rheumatology ROS completed.   PMFS History:  Patient Active Problem List   Diagnosis Date Noted   Polyarthralgia 06/22/2022   Ureteral calculus, right 04/27/2022   Nephrolithiasis 04/27/2022   Statin myopathy 03/03/2022   Right lateral epicondylitis 02/11/2022   Left lateral epicondylitis 02/11/2022   Arthritis of carpometacarpal (CMC) joint of right thumb 06/08/2021   Hepatic steatosis 12/24/2019   Gastroesophageal reflux disease 08/23/2019   Controlled substance agreement signed 08/23/2019   Moderate recurrent major depression (HCC) 08/23/2019   OSA (obstructive sleep apnea) 12/05/2017   GAD (generalized anxiety disorder) 11/24/2017   Leg cramps, sleep related 11/24/2017   Hypertension associated with diabetes (HCC) 03/29/2017  Diabetes mellitus (HCC) 03/29/2017   Hyperlipidemia associated with type 2 diabetes mellitus (HCC) 03/29/2017   Diverticulosis of large intestine without hemorrhage 03/29/2017   History of prostatitis 03/29/2017   Light headedness    Tingling    Weakness    Trigeminal neuralgia     Past Medical History:  Diagnosis Date   Diabetes mellitus without complication (HCC)    H/O echocardiogram 09/2017   normal EF   Headache    Hyperlipidemia     Hypertension    Light headedness    OSA on CPAP    Palpitations    Tingling    toes   Trigeminal neuralgia     Family History  Problem Relation Age of Onset   Diabetes Mother    Hyperlipidemia Mother    Hypertension Mother    Cancer Father    Lung cancer Father    Heart disease Sister    Past Surgical History:  Procedure Laterality Date   NO PAST SURGERIES     Social History   Social History Narrative   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   Immunization History  Administered Date(s) Administered   Influenza Split 02/13/2017   Influenza,inj,Quad PF,6+ Mos 02/21/2022   Influenza,inj,quad, With Preservative 02/13/2017, 02/20/2018, 02/05/2019   Influenza-Unspecified 01/29/2020, 03/30/2021     Objective: Vital Signs: There were no vitals taken for this visit.   Physical Exam   Musculoskeletal Exam: ***  CDAI Exam: CDAI Score: -- Patient Global: --; Provider Global: -- Swollen: --; Tender: -- Joint Exam 07/26/2022   No joint exam has been documented for this visit   There is currently no information documented on the homunculus. Go to the Rheumatology activity and complete the homunculus joint exam.  Investigation: No additional findings.  Imaging: No results found.  Recent Labs: Lab Results  Component Value Date   WBC 7.2 05/19/2022   HGB 15.3 05/19/2022   PLT 252 05/19/2022   NA 142 05/19/2022   K 4.6 05/19/2022   CL 102 05/19/2022   CO2 22 05/19/2022   GLUCOSE 196 (H) 05/19/2022   BUN 10 05/19/2022   CREATININE 1.03 05/19/2022   BILITOT 0.3 05/19/2022   ALKPHOS 62 05/19/2022   AST 46 (H) 05/19/2022   ALT 76 (H) 05/19/2022   PROT 7.0 05/19/2022   ALBUMIN 4.7 05/19/2022   CALCIUM 10.0 05/19/2022   GFRAA 103 04/09/2020    Speciality Comments: No specialty comments available.  Procedures:  No procedures performed Allergies: Morphine and related and Statins   Assessment /  Plan:     Visit Diagnoses: No diagnosis found.  ***  Orders: No orders of the defined types were placed in this encounter.  No orders of the defined types were placed in this encounter.    Follow-Up Instructions: No follow-ups on file.   Fuller Plan, MD  Note - This record has been created using [employer].  Chart creation errors have been sought, but may not always  have been located. Such creation errors do not reflect on  the standard of medical care.

## 2022-07-26 ENCOUNTER — Encounter: Payer: Self-pay | Admitting: Internal Medicine

## 2022-07-26 ENCOUNTER — Ambulatory Visit: Payer: Medicare Other | Attending: Internal Medicine | Admitting: Internal Medicine

## 2022-07-26 VITALS — BP 130/83 | HR 64 | Resp 12 | Ht 73.0 in | Wt 291.0 lb

## 2022-07-26 DIAGNOSIS — M6520 Calcific tendinitis, unspecified site: Secondary | ICD-10-CM

## 2022-07-26 DIAGNOSIS — M797 Fibromyalgia: Secondary | ICD-10-CM

## 2022-07-26 DIAGNOSIS — E1169 Type 2 diabetes mellitus with other specified complication: Secondary | ICD-10-CM

## 2022-07-26 DIAGNOSIS — M2550 Pain in unspecified joint: Secondary | ICD-10-CM

## 2022-07-26 DIAGNOSIS — M1811 Unilateral primary osteoarthritis of first carpometacarpal joint, right hand: Secondary | ICD-10-CM | POA: Diagnosis not present

## 2022-07-26 NOTE — Patient Instructions (Signed)
I recommend checking out the [facility] patient-centered guide for fibromyalgia and chronic pain management: [email or web address]

## 2022-08-01 ENCOUNTER — Ambulatory Visit (INDEPENDENT_AMBULATORY_CARE_PROVIDER_SITE_OTHER): Payer: Medicare Other | Admitting: Podiatry

## 2022-08-01 ENCOUNTER — Encounter: Payer: Self-pay | Admitting: Podiatry

## 2022-08-01 DIAGNOSIS — M7661 Achilles tendinitis, right leg: Secondary | ICD-10-CM | POA: Diagnosis not present

## 2022-08-01 MED ORDER — TRIAMCINOLONE ACETONIDE 10 MG/ML IJ SUSP
10.0000 mg | Freq: Once | INTRAMUSCULAR | Status: AC
  Administered 2022-08-01: 10 mg

## 2022-08-03 NOTE — Progress Notes (Signed)
Subjective:   Patient ID: Matthew Pennington, male   DOB: 56 y.o.   MRN: [id number]   HPI Patient presents stating this pain on the outside has started up again and it did very well for around 5 months.  States that he would like to have surgery someday he just cannot do it now due to work schedule   ROS      Objective:  Physical Exam  Neurovascular status intact with patient found to have inflammation pain of the posterior lateral aspect of the Achilles tendon insertion calcaneus with a bone spur formation no central medial involvement     Assessment:  Achilles tendinitis right lateral side with inflammation with spur formation     Plan:  H&P reviewed discussed risk of injection he completely understands this and understands risk of rupture and wants to continue to pursue this treatment and I went ahead today did sterile prep and carefully injected the lateral side 3 mg dexamethasone Kenalog 5 mg Xylocaine and advised on stretching exercises and reduced activity for the next few days.  Eventually he no surgical be necessary and if we cannot keep symptoms under good control for these long periods of time that will become more imminent

## 2022-08-11 ENCOUNTER — Ambulatory Visit (INDEPENDENT_AMBULATORY_CARE_PROVIDER_SITE_OTHER): Payer: Medicare Other | Admitting: Family

## 2022-08-11 ENCOUNTER — Encounter: Payer: Self-pay | Admitting: Family

## 2022-08-11 VITALS — BP 121/83 | HR 76 | Temp 97.7°F | Ht 73.0 in | Wt 291.6 lb

## 2022-08-11 DIAGNOSIS — L0291 Cutaneous abscess, unspecified: Secondary | ICD-10-CM | POA: Diagnosis not present

## 2022-08-11 MED ORDER — MUPIROCIN CALCIUM 2 % EX CREA: 1.0000 | TOPICAL_CREAM | Freq: Two times a day (BID) | CUTANEOUS | 0 refills | Status: DC

## 2022-08-11 NOTE — Progress Notes (Signed)
Subjective:    Patient ID: Matthew Pennington, male    DOB: 06-22-1965, 57 y.o.   MRN: [id number]  Chief Complaint  Patient presents with   Cyst    Behind left ear    HPI PT presents to the office today with a nodule behind left ear that he noticed a few weeks ago. Reports it has become larger and more tender. Reports mild aching pain of 3-4 out 10.  Denies fever, sore throat.   Review of Systems  All other systems reviewed and are negative.      Objective:   Physical Exam Vitals reviewed.  Constitutional:      General: He is not in acute distress.    Appearance: He is well-developed.  HENT:     Head: Normocephalic.  Eyes:     General:        Right eye: No discharge.        Left eye: No discharge.     Pupils: Pupils are equal, round, and reactive to light.  Neck:     Thyroid: No thyromegaly.  Cardiovascular:     Rate and Rhythm: Normal rate and regular rhythm.     Heart sounds: Normal heart sounds. No murmur heard. Pulmonary:     Effort: Pulmonary effort is normal. No respiratory distress.     Breath sounds: Normal breath sounds. No wheezing.  Abdominal:     General: Bowel sounds are normal. There is no distension.     Palpations: Abdomen is soft.     Tenderness: There is no abdominal tenderness.  Musculoskeletal:        General: No tenderness. Normal range of motion.     Cervical back: Normal range of motion and neck supple.  Skin:    General: Skin is warm and dry.     Findings: No erythema or rash.          Comments: Small abscess approx 1.5 X1.5 ml , tender and soft  Neurological:     Mental Status: He is alert and oriented to person, place, and time.     Cranial Nerves: No cranial nerve deficit.     Deep Tendon Reflexes: Reflexes are normal and symmetric.  Psychiatric:        Behavior: Behavior normal.        Thought Content: Thought content normal.        Judgment: Judgment normal.    Area prepped with betadine Injected with lidocaine  0.5 ML And small  incision made Purulent discharge then sanguinous discharge Patient tolerated well.   BP 121/83   Pulse 76   Temp 97.7 F (36.5 C) (Temporal)   Ht 6\' 1"  (1.854 m)   Wt 291 lb 9.6 oz (132.3 kg)   SpO2 95%   BMI 38.47 kg/m       Assessment & Plan:  [name] comes in today with chief complaint of Cyst (Behind left ear)   Diagnosis and orders addressed:  1. Abscess Keep clean and dry Avoid picking or squeezing  Bactroban as needed  Report any fevers  Follow up if symptoms worsen or do not improve - mupirocin cream (BACTROBAN) 2 %; Apply 1 Application topically 2 (two) times daily.  Dispense: 15 g; Refill: 0  [name], FNP

## 2022-08-11 NOTE — Patient Instructions (Signed)
Skin Abscess  A skin abscess is an infected area on or under your skin. It contains pus and other material. An abscess may also be called a furuncle, carbuncle, or boil. It is often the result of an infection caused by bacteria. An abscess can occur in or on almost any part of your body. Sometimes, an abscess may break open (rupture) on its own. In most cases, it will keep getting worse unless it is treated. An abscess can cause pain and make you feel ill. An untreated abscess can cause infection to spread to other parts of your body or your bloodstream. The abscess may need to be drained. You may also need to take antibiotics. What are the causes? An abscess occurs when germs, like bacteria, pass through your skin and cause an infection. This may be caused by: A scrape or cut on your skin. A puncture wound through your skin, such as a needle injection or insect bite. Blocked oil or sweat glands. Blocked and infected hair follicles. A fluid-filled sac that forms beneath your skin (sebaceous cyst) and becomes infected. What increases the risk? You may be more likely to develop an abscess if: You have problems with blood circulation, or you have a weak body defense system (immune system). You have diabetes. You have dry and irritated skin. You get injections often or use IV drugs. You have a foreign body in a wound, such as a splinter. You smoke or use tobacco products. What are the signs or symptoms? Symptoms of this condition include: A painful, firm bump under the skin. A bump with pus at the top. This may break through the skin and drain. Other symptoms include: Redness and swelling around the abscess. Warmth or tenderness. Swelling of the lymph nodes (glands) near the abscess. A sore on the skin. How is this diagnosed? This condition may be diagnosed based on a physical exam and your medical history. You may also have tests done, such as: A test of a sample of pus. This may be done  to find what is causing the infection. Blood tests. Imaging tests, such as an ultrasound, CT scan, or MRI. How is this treated? A small abscess that drains on its own may not need to be treated. Treatment for larger abscesses may include: Moist heat or a heat pack applied to the area a few times a day. Incision and drainage. This is a procedure to drain the abscess. Antibiotics. For a severe abscess, you may first get antibiotics through an IV and then change to antibiotics by mouth. Follow these instructions at home: Medicines Take over-the-counter and prescription medicines only as told by your provider. If you were prescribed antibiotics, take them as told by your provider. Do not stop using the antibiotic even if you start to feel better. Abscess care  If you have an abscess that has not drained, apply heat to the affected area. Use the heat source that your provider recommends, such as a moist heat pack or a heating pad. Place a towel between your skin and the heat source. Leave the heat on for 20-30 minutes at a time. If your skin turns bright red, remove the heat right away to prevent burns. The risk of burns is higher if you cannot feel pain, heat, or cold. Follow instructions from your provider about how to take care of your abscess. Make sure you: Cover the abscess with a bandage (dressing). Wash your hands with soap and water for at least 20 seconds before   and after you change the dressing or gauze. If soap and water are not available, use hand sanitizer. Change your dressing or gauze as told by your provider. Check your abscess every day for signs of an infection that is getting worse. Check for: More redness, swelling, pain, or tenderness. More fluid or blood. Warmth. More pus or a worse smell. General instructions To avoid spreading the infection: Do not share personal care items, towels, or hot tubs with others. Avoid making skin contact with other people. Be careful  when getting rid of used dressings, wound packing, or any drainage from the abscess. Do not use any products that contain nicotine or tobacco. These products include cigarettes, chewing tobacco, and vaping devices, such as e-cigarettes. If you need help quitting, ask your provider. Do not use any creams, ointments, or liquids unless you have been told to by your provider. Contact a health care provider if: You see redness that spreads quickly or red streaks on your skin spreading away from the abscess. You have any signs of worse infection at the abscess. You vomit every time you eat or drink. You have a fever, chills, or muscle aches. The cyst or abscess returns. Get help right away if: You have severe pain. You make less pee (urine) than normal. This information is not intended to replace advice given to you by your health care provider. Make sure you discuss any questions you have with your health care provider. Document Revised: 12/29/2021 Document Reviewed: 12/29/2021 Elsevier Patient Education  2023 [employer]  

## 2022-08-17 ENCOUNTER — Ambulatory Visit: Payer: Medicare Other | Admitting: Internal Medicine

## 2022-09-02 DIAGNOSIS — G4733 Obstructive sleep apnea (adult) (pediatric): Secondary | ICD-10-CM | POA: Diagnosis not present

## 2022-09-20 ENCOUNTER — Ambulatory Visit: Payer: Medicare Other | Admitting: Internal Medicine

## 2022-09-20 NOTE — Progress Notes (Deleted)
Office Visit Note  Patient: Matthew Pennington             Date of Birth: April 16, 1966           MRN: [id number]             PCP: Junie Spencer, FNP Referring: Junie Spencer, FNP Visit Date: 09/20/2022   Subjective:  No chief complaint on file.   History of Present Illness: Matthew Pennington is a 57 y.o. male here for follow up ***   Previous HPI 07/26/22 Matthew Pennington is a 57 y.o. male here for follow up for joint pain in multiple areas especially recurrent issues affecting shoulders elbows and his thumbs.  Laboratory test at initial visit were negative for RA antibodies serum inflammatory markers unremarkable with very slightly high sed rate of 22.  Shoulder x-rays without significant appearing glenohumeral joint osteoarthritis but looks like probable chronic injury related changes at right [facility] joint and probable early calcific tendinitis changes.  He continues having similar ongoing symptoms as before.  I have a lot of pain both in the joints and extending into muscle areas.  He is scheduled for repeat injection for swelling around the Achilles on his right ankle.  Still having the persistent fatigue little exertion tolerance irritable bowel symptoms and concentration difficulty.  Also thinks trigeminal neuralgia may be increased with pain in the lower distribution on his left side down to the jaw and chin.   Previous HPI 06/22/22 Matthew Pennington is a 57 y.o. male here for evaluation with chronic joint pains and concern for fibromyalgia syndrome. He has chronic musculoskeletal pain with low back pain, bilateral lateral epicondylitis, and some generalized pain and weakness.  He has had significant joint pain for several years but this is particularly bothersome for about the past 1 year.  Has really had pain and soreness both localized to the joints and pretty diffusely at times.  Shoulder pain is particularly worse with having to reach overhead or weightbearing to pick up any kind of heavy object.  Also  experiences pain at the shoulders and hips if there is pressure on these areas while sleeping worse on his left side than the right.  He has had repeated episodes with pain in both elbows both with use and severe tenderness to any pressure on the area.  He also frequently has pain in his hands occasionally with some swelling or increased fluid.  Frequently has pain in his low back and knees with some existing osteoarthritis.  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 He has peripheral neuropathy in bilateral feet more severe on the left side. He has episodic pain related to trigeminal neuralgia this has been excruciating in severity but had a good improvement with addition of gabapentin and oxcarbazepine.  He experiences occasional dizziness or lightheadedness but not always associated with positional change.  He does not have many headaches outside of the trigeminal neuralgia pain. 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 Negative labs screening for RA and gout in 03/2020. Outside of the specific complaints he has generally felt less well including poor mood and irritability. He sleeps with CPAP but also has nighttime awakenings on most nights without a specific cause that he can tell.  He has some baseline degree of fatigue on most days also experiences episodes of taking up to 1  or 2 days to recover after increase in physical exertion on any particular day's activity. He has had frequent issues with diarrhea and loose stools not associated with any kind of abdominal pain mucus or blood in his bowel movements.     No Rheumatology ROS completed.   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   Polyarthralgia 06/22/2022   Ureteral calculus, right  04/27/2022   Nephrolithiasis 04/27/2022   Osteoarthritis of carpometacarpal (CMC) joint of thumb 04/12/2022   Statin myopathy 03/03/2022   Right lateral epicondylitis 02/11/2022   Left lateral epicondylitis 02/11/2022   Arthritis of carpometacarpal (CMC) joint of right thumb 06/08/2021   Hepatic steatosis 12/24/2019   Gastroesophageal reflux disease 08/23/2019   Controlled substance agreement signed 08/23/2019   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   OSA (obstructive sleep apnea) 12/05/2017   GAD (generalized anxiety disorder) 11/24/2017   Leg cramps, sleep related 11/24/2017   Hypertension associated with diabetes 03/29/2017   Diabetes mellitus 03/29/2017   Hyperlipidemia associated with type 2 diabetes mellitus 03/29/2017   Diverticulosis of large intestine without hemorrhage 03/29/2017   History of prostatitis 03/29/2017   Light headedness    Tingling    Weakness    Trigeminal neuralgia     Past Medical History:  Diagnosis Date   Diabetes mellitus without complication (HCC)    H/O echocardiogram 09/2017   normal EF   Headache    Hyperlipidemia    Hypertension    Light headedness    OSA on CPAP    Palpitations    Tingling    toes   Trigeminal neuralgia     Family History  Problem Relation Age of Onset   Diabetes Mother    Hyperlipidemia Mother    Hypertension Mother    Cancer Father    Lung cancer Father    Heart disease Sister    Past Surgical History:  Procedure Laterality Date   NO PAST SURGERIES     Social History   Social History Narrative   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   Immunization History  Administered Date(s) Administered   Influenza Split 02/13/2017   Influenza,inj,Quad PF,6+ Mos 02/21/2022   Influenza,inj,quad, With Preservative 02/13/2017, 02/20/2018, 02/05/2019   Influenza-Unspecified 01/29/2020, 03/30/2021     Objective: Vital Signs: There were no vitals  taken for this visit.   Physical Exam   Musculoskeletal Exam: ***  CDAI Exam: CDAI Score: -- Patient Global: --; Provider Global: -- Swollen: --; Tender: -- Joint Exam 09/20/2022   No joint exam has been documented for this visit   There is currently no information documented on the homunculus. Go to the Rheumatology activity and complete the homunculus joint exam.  Investigation: No additional findings.  Imaging: No results found.  Recent Labs: Lab Results  Component Value Date   WBC 7.2 05/19/2022   HGB 15.3 05/19/2022   PLT 252 05/19/2022   NA 142 05/19/2022   K 4.6 05/19/2022   CL 102 05/19/2022   CO2 22 05/19/2022   GLUCOSE 196 (H) 05/19/2022   BUN 10 05/19/2022   CREATININE 1.03 05/19/2022   BILITOT 0.3 05/19/2022   ALKPHOS 62 05/19/2022   AST 46 (H) 05/19/2022   ALT 76 (H) 05/19/2022   PROT 7.0 05/19/2022   ALBUMIN 4.7 05/19/2022   CALCIUM 10.0 05/19/2022   GFRAA  103 04/09/2020    Speciality Comments: No specialty comments available.  Procedures:  No procedures performed Allergies: Morphine and related and Statins   Assessment / Plan:     Visit Diagnoses: No diagnosis found.  ***  Orders: No orders of the defined types were placed in this encounter.  No orders of the defined types were placed in this encounter.    Follow-Up Instructions: No follow-ups on file.   Fuller Plan, MD  Note - This record has been created using [employer].  Chart creation errors have been sought, but may not always  have been located. Such creation errors do not reflect on  the standard of medical care.

## 2022-09-26 ENCOUNTER — Encounter: Payer: Self-pay | Admitting: Internal Medicine

## 2022-09-26 ENCOUNTER — Ambulatory Visit: Payer: Medicare Other | Attending: Internal Medicine | Admitting: Internal Medicine

## 2022-09-26 VITALS — BP 133/87 | HR 64 | Resp 16 | Ht 73.0 in | Wt 293.0 lb

## 2022-09-26 DIAGNOSIS — M6520 Calcific tendinitis, unspecified site: Secondary | ICD-10-CM | POA: Diagnosis not present

## 2022-09-26 MED ORDER — LIDOCAINE HCL 1 % IJ SOLN
1.5000 mL | INTRAMUSCULAR | Status: AC | PRN
  Administered 2022-09-26: 1.5 mL

## 2022-09-26 MED ORDER — TRIAMCINOLONE ACETONIDE 40 MG/ML IJ SUSP
40.0000 mg | INTRAMUSCULAR | Status: AC | PRN
  Administered 2022-09-26: 40 mg via INTRA_ARTICULAR

## 2022-09-26 NOTE — Progress Notes (Deleted)
Office Visit Note  Patient: Matthew Pennington             Date of Birth: 05-Dec-1965           MRN: [id number]             PCP: Junie Spencer, FNP Referring: Junie Spencer, FNP Visit Date: 09/26/2022 Occupation: @GUAROCC @  Subjective:  No chief complaint on file.   History of Present Illness: Matthew Pennington is a 57 y.o. male ***     Activities of Daily Living:  Patient reports morning stiffness for *** {minute/hour:19697}.   Patient {ACTIONS;DENIES/REPORTS:21021675::"Denies"} nocturnal pain.  Difficulty dressing/grooming: {ACTIONS;DENIES/REPORTS:21021675::"Denies"} Difficulty climbing stairs: {ACTIONS;DENIES/REPORTS:21021675::"Denies"} Difficulty getting out of chair: {ACTIONS;DENIES/REPORTS:21021675::"Denies"} Difficulty using hands for taps, buttons, cutlery, and/or writing: {ACTIONS;DENIES/REPORTS:21021675::"Denies"}  Review of Systems  Constitutional:  Positive for fatigue.  HENT:  Positive for mouth dryness. Negative for mouth sores.   Eyes:  Negative for dryness.  Respiratory:  Negative for shortness of breath.   Cardiovascular:  Negative for chest pain and palpitations.  Gastrointestinal:  Positive for diarrhea. Negative for blood in stool and constipation.  Endocrine: Positive for increased urination.  Genitourinary:  Negative for involuntary urination.  Musculoskeletal:  Positive for joint pain, gait problem, joint pain, joint swelling, myalgias, muscle weakness, morning stiffness, muscle tenderness and myalgias.  Skin:  Negative for color change, rash, hair loss and sensitivity to sunlight.  Allergic/Immunologic: Negative for susceptible to infections.  Neurological:  Positive for dizziness and headaches.  Hematological:  Negative for swollen glands.  Psychiatric/Behavioral:  Positive for depressed mood and sleep disturbance. The patient is nervous/anxious.     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   Polyarthralgia 06/22/2022   Ureteral calculus, right 04/27/2022   Nephrolithiasis 04/27/2022   Osteoarthritis of carpometacarpal (CMC) joint of thumb 04/12/2022   Statin myopathy 03/03/2022   Right lateral epicondylitis 02/11/2022   Left lateral epicondylitis 02/11/2022   Arthritis of carpometacarpal (CMC) joint of right thumb 06/08/2021   Hepatic steatosis 12/24/2019   Gastroesophageal reflux disease 08/23/2019   Controlled substance agreement signed 08/23/2019   Moderate recurrent major depression (HCC) 08/23/2019   OSA (obstructive sleep apnea) 12/05/2017   GAD (generalized anxiety disorder) 11/24/2017   Leg cramps, sleep related 11/24/2017   Hypertension associated with diabetes (HCC) 03/29/2017   Diabetes mellitus (HCC) 03/29/2017   Hyperlipidemia associated with type 2 diabetes mellitus (HCC) 03/29/2017   Diverticulosis of large intestine without hemorrhage 03/29/2017   History of prostatitis 03/29/2017   Light headedness    Tingling    Weakness    Trigeminal neuralgia     Past Medical History:  Diagnosis Date   Diabetes mellitus without complication (HCC)    H/O echocardiogram 09/2017   normal EF   Headache    Hyperlipidemia    Hypertension    Light headedness    OSA on CPAP    Palpitations    Tingling    toes   Trigeminal neuralgia     Family History  Problem Relation Age of Onset   Diabetes Mother    Hyperlipidemia Mother    Hypertension Mother    Cancer Father    Lung cancer Father    Heart disease Sister    Past Surgical History:  Procedure Laterality Date   NO PAST SURGERIES     Social History   Social History Narrative   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   Immunization History  Administered Date(s) Administered   Influenza Split 02/13/2017   Influenza,inj,Quad PF,6+ Mos 02/21/2022   Influenza,inj,quad, With Preservative 02/13/2017, 02/20/2018,  02/05/2019   Influenza-Unspecified 01/29/2020, 03/30/2021     Objective: Vital Signs: BP 133/87 (BP Location: Left Arm, Patient Position: Sitting, Cuff Size: Large)   Pulse 64    Physical Exam   Musculoskeletal Exam: ***  CDAI Exam: CDAI Score: -- Patient Global: --; Provider Global: -- Swollen: --; Tender: -- Joint Exam 09/26/2022   No joint exam has been documented for this visit   There is currently no information documented on the homunculus. Go to the Rheumatology activity and complete the homunculus joint exam.  Investigation: No additional findings.  Imaging: No results found.  Recent Labs: Lab Results  Component Value Date   WBC 7.2 05/19/2022   HGB 15.3 05/19/2022   PLT 252 05/19/2022   NA 142 05/19/2022   K 4.6 05/19/2022   CL 102 05/19/2022   CO2 22 05/19/2022   GLUCOSE 196 (H) 05/19/2022   BUN 10 05/19/2022   CREATININE 1.03 05/19/2022   BILITOT 0.3 05/19/2022   ALKPHOS 62 05/19/2022   AST 46 (H) 05/19/2022   ALT 76 (H) 05/19/2022   PROT 7.0 05/19/2022   ALBUMIN 4.7 05/19/2022   CALCIUM 10.0 05/19/2022   GFRAA 103 04/09/2020    Speciality Comments: No specialty comments available.  Procedures:  No procedures performed Allergies: Morphine and related and Statins   Assessment / Plan:     Visit Diagnoses: No diagnosis found.  Orders: No orders of the defined types were placed in this encounter.  No orders of the defined types were placed in this encounter.   Face-to-face time spent with patient was *** minutes. Greater than 50% of time was spent in counseling and coordination of care.  Follow-Up Instructions: No follow-ups on file.   Metta Clines, RT  Note - This record has been created using [employer].  Chart creation errors have been sought, but may not always  have been located. Such creation errors do not reflect on  the standard of medical care.

## 2022-09-26 NOTE — Progress Notes (Signed)
Procedure Note  Patient: Matthew Pennington             Date of Birth: June 21, 1965           MRN: [id number]             Visit Date: 09/26/2022  Procedures: Visit Diagnoses:  1. Calcific tendonitis     Large Joint Inj: R subacromial bursa on 09/26/2022 12:30 PM Indications: pain Details: 27 G needle, lateral approach Medications: 1.5 mL lidocaine 1 %; 40 mg triamcinolone acetonide 40 MG/ML Outcome: tolerated well, no immediate complications Procedure, treatment alternatives, risks and benefits explained, specific risks discussed. Consent was given by the patient. Immediately prior to procedure a time out was called to verify the correct patient, procedure, equipment, support staff and site/side marked as required. Patient was prepped and draped in the usual sterile fashion.     Patient presented for follow up with planned injection for treatment of calcific tendonitis of the right shoulder. He is feeling well today but has ongoing joint pain, especially limiting overhead movement and sleep. He had injection in the right ankle at the beginning of March more than 6 weeks ago.

## 2022-09-30 DIAGNOSIS — M9902 Segmental and somatic dysfunction of thoracic region: Secondary | ICD-10-CM | POA: Diagnosis not present

## 2022-09-30 DIAGNOSIS — M47812 Spondylosis without myelopathy or radiculopathy, cervical region: Secondary | ICD-10-CM | POA: Diagnosis not present

## 2022-09-30 DIAGNOSIS — M9901 Segmental and somatic dysfunction of cervical region: Secondary | ICD-10-CM | POA: Diagnosis not present

## 2022-09-30 DIAGNOSIS — S29012A Strain of muscle and tendon of back wall of thorax, initial encounter: Secondary | ICD-10-CM | POA: Diagnosis not present

## 2022-09-30 DIAGNOSIS — M542 Cervicalgia: Secondary | ICD-10-CM | POA: Diagnosis not present

## 2022-09-30 DIAGNOSIS — M47816 Spondylosis without myelopathy or radiculopathy, lumbar region: Secondary | ICD-10-CM | POA: Diagnosis not present

## 2022-09-30 DIAGNOSIS — M9903 Segmental and somatic dysfunction of lumbar region: Secondary | ICD-10-CM | POA: Diagnosis not present

## 2022-09-30 DIAGNOSIS — S338XXA Sprain of other parts of lumbar spine and pelvis, initial encounter: Secondary | ICD-10-CM | POA: Diagnosis not present

## 2022-10-11 DIAGNOSIS — S338XXA Sprain of other parts of lumbar spine and pelvis, initial encounter: Secondary | ICD-10-CM | POA: Diagnosis not present

## 2022-10-11 DIAGNOSIS — M47816 Spondylosis without myelopathy or radiculopathy, lumbar region: Secondary | ICD-10-CM | POA: Diagnosis not present

## 2022-10-11 DIAGNOSIS — M47812 Spondylosis without myelopathy or radiculopathy, cervical region: Secondary | ICD-10-CM | POA: Diagnosis not present

## 2022-10-11 DIAGNOSIS — M542 Cervicalgia: Secondary | ICD-10-CM | POA: Diagnosis not present

## 2022-10-11 DIAGNOSIS — M9903 Segmental and somatic dysfunction of lumbar region: Secondary | ICD-10-CM | POA: Diagnosis not present

## 2022-10-11 DIAGNOSIS — M9901 Segmental and somatic dysfunction of cervical region: Secondary | ICD-10-CM | POA: Diagnosis not present

## 2022-10-11 DIAGNOSIS — M9902 Segmental and somatic dysfunction of thoracic region: Secondary | ICD-10-CM | POA: Diagnosis not present

## 2022-10-11 DIAGNOSIS — S29012A Strain of muscle and tendon of back wall of thorax, initial encounter: Secondary | ICD-10-CM | POA: Diagnosis not present

## 2022-10-14 ENCOUNTER — Other Ambulatory Visit: Payer: Self-pay | Admitting: Family

## 2022-10-14 DIAGNOSIS — E1159 Type 2 diabetes mellitus with other circulatory complications: Secondary | ICD-10-CM

## 2022-10-14 DIAGNOSIS — E1169 Type 2 diabetes mellitus with other specified complication: Secondary | ICD-10-CM

## 2022-10-18 DIAGNOSIS — R0602 Shortness of breath: Secondary | ICD-10-CM | POA: Diagnosis not present

## 2022-10-18 DIAGNOSIS — R0789 Other chest pain: Secondary | ICD-10-CM | POA: Diagnosis not present

## 2022-10-23 ENCOUNTER — Other Ambulatory Visit: Payer: Self-pay | Admitting: Family

## 2022-10-23 DIAGNOSIS — G500 Trigeminal neuralgia: Secondary | ICD-10-CM

## 2022-10-28 DIAGNOSIS — G4733 Obstructive sleep apnea (adult) (pediatric): Secondary | ICD-10-CM | POA: Diagnosis not present

## 2022-11-03 ENCOUNTER — Encounter: Payer: Self-pay | Admitting: Family

## 2022-11-03 ENCOUNTER — Ambulatory Visit (INDEPENDENT_AMBULATORY_CARE_PROVIDER_SITE_OTHER): Payer: Medicare Other | Admitting: Family

## 2022-11-03 VITALS — BP 131/72 | HR 79 | Temp 97.9°F | Ht 73.0 in | Wt 291.6 lb

## 2022-11-03 DIAGNOSIS — M545 Low back pain, unspecified: Secondary | ICD-10-CM

## 2022-11-03 DIAGNOSIS — G720 Drug-induced myopathy: Secondary | ICD-10-CM

## 2022-11-03 DIAGNOSIS — E1159 Type 2 diabetes mellitus with other circulatory complications: Secondary | ICD-10-CM

## 2022-11-03 DIAGNOSIS — I152 Hypertension secondary to endocrine disorders: Secondary | ICD-10-CM | POA: Diagnosis not present

## 2022-11-03 DIAGNOSIS — Z0001 Encounter for general adult medical examination with abnormal findings: Secondary | ICD-10-CM | POA: Diagnosis not present

## 2022-11-03 DIAGNOSIS — G4733 Obstructive sleep apnea (adult) (pediatric): Secondary | ICD-10-CM

## 2022-11-03 DIAGNOSIS — G8929 Other chronic pain: Secondary | ICD-10-CM

## 2022-11-03 DIAGNOSIS — M797 Fibromyalgia: Secondary | ICD-10-CM

## 2022-11-03 DIAGNOSIS — R531 Weakness: Secondary | ICD-10-CM

## 2022-11-03 DIAGNOSIS — E1169 Type 2 diabetes mellitus with other specified complication: Secondary | ICD-10-CM

## 2022-11-03 DIAGNOSIS — G500 Trigeminal neuralgia: Secondary | ICD-10-CM

## 2022-11-03 DIAGNOSIS — K219 Gastro-esophageal reflux disease without esophagitis: Secondary | ICD-10-CM

## 2022-11-03 DIAGNOSIS — E785 Hyperlipidemia, unspecified: Secondary | ICD-10-CM | POA: Diagnosis not present

## 2022-11-03 DIAGNOSIS — Z0000 Encounter for general adult medical examination without abnormal findings: Secondary | ICD-10-CM | POA: Diagnosis not present

## 2022-11-03 DIAGNOSIS — F331 Major depressive disorder, recurrent, moderate: Secondary | ICD-10-CM

## 2022-11-03 DIAGNOSIS — G4762 Sleep related leg cramps: Secondary | ICD-10-CM

## 2022-11-03 DIAGNOSIS — Z79899 Other long term (current) drug therapy: Secondary | ICD-10-CM

## 2022-11-03 LAB — BAYER DCA HB A1C WAIVED: HB A1C (BAYER DCA - WAIVED): 8.9 % — ABNORMAL HIGH (ref 4.8–5.6)

## 2022-11-03 MED ORDER — ENALAPRIL MALEATE 20 MG PO TABS: 20.0000 mg | ORAL_TABLET | Freq: Every day | ORAL | 0 refills | Status: DC

## 2022-11-03 MED ORDER — DAPAGLIFLOZIN PROPANEDIOL 5 MG PO TABS: 5.0000 mg | ORAL_TABLET | Freq: Every day | ORAL | 1 refills | Status: DC

## 2022-11-03 MED ORDER — DESVENLAFAXINE SUCCINATE ER 50 MG PO TB24
50.0000 mg | ORAL_TABLET | Freq: Every day | ORAL | 0 refills | Status: DC
Start: 2022-11-03 — End: 2023-07-28

## 2022-11-03 MED ORDER — TRAMADOL HCL 50 MG PO TABS
50.0000 mg | ORAL_TABLET | Freq: Every day | ORAL | 2 refills | Status: DC
Start: 2022-11-03 — End: 2023-07-28

## 2022-11-03 MED ORDER — ROPINIROLE HCL 0.25 MG PO TABS: ORAL_TABLET | ORAL | 1 refills | Status: DC

## 2022-11-03 MED ORDER — METFORMIN HCL 500 MG PO TABS
ORAL_TABLET | ORAL | 0 refills | Status: DC
Start: 2022-11-03 — End: 2023-04-19

## 2022-11-03 MED ORDER — PANTOPRAZOLE SODIUM 40 MG PO TBEC: 40.0000 mg | DELAYED_RELEASE_TABLET | Freq: Every day | ORAL | 1 refills | Status: DC

## 2022-11-03 MED ORDER — IBUPROFEN 800 MG PO TABS
800.0000 mg | ORAL_TABLET | Freq: Every day | ORAL | 0 refills | Status: DC
Start: 2022-11-03 — End: 2023-04-14

## 2022-11-03 MED ORDER — GABAPENTIN 300 MG PO CAPS: 300.0000 mg | ORAL_CAPSULE | Freq: Three times a day (TID) | ORAL | 0 refills | Status: DC

## 2022-11-03 MED ORDER — EZETIMIBE 10 MG PO TABS: 10.0000 mg | ORAL_TABLET | Freq: Every day | ORAL | 0 refills | Status: DC

## 2022-11-03 NOTE — Patient Instructions (Signed)

## 2022-11-03 NOTE — Progress Notes (Signed)
Subjective:    Patient ID: Matthew Pennington, male    DOB: Nov 29, 1965, 57 y.o.   MRN: [id number]  Chief Complaint  Patient presents with   Annual Exam    Wants to discuss depression wants a new meds   Allergies   Pt presents to the office today for CPE and chronic follow up. He is followed by Neurologists for Trigeminal neuralgia. She has given him oxycodone as needed for flare ups. He only takes it 3-4 times a year.     He does take Ultram as needed for chronic back pain. He only takes this 1-2 times a month. He never mixes the two.    He is currently going to chiropractor once a week for back and shoulder pain.    He has OSA and uses CPAP nightly.    He is having right hand weakness and is followed by Ortho.    He is having a great deal of heel pain. States he has a cyst on his heel and getting steroid injections and followed by podiatry.    Reports statins causes muscle pain and can not tolerate. Hypertension This is a chronic problem. The current episode started more than 1 year ago. The problem has been resolved since onset. The problem is controlled. Pertinent negatives include no blurred vision, malaise/fatigue, peripheral edema or shortness of breath. Risk factors for coronary artery disease include obesity, dyslipidemia and male gender. The current treatment provides moderate improvement.  Gastroesophageal Reflux He complains of belching and heartburn. This is a chronic problem. The current episode started more than 1 year ago. The problem occurs occasionally. Associated symptoms include fatigue. Risk factors include obesity. He has tried a PPI for the symptoms. The treatment provided moderate relief.  Hyperlipidemia This is a chronic problem. The current episode started more than 1 year ago. The problem is uncontrolled. Exacerbating diseases include obesity. Pertinent negatives include no shortness of breath. Current antihyperlipidemic treatment includes diet change and ezetimibe.  The current treatment provides mild improvement of lipids. Risk factors for coronary artery disease include dyslipidemia, diabetes mellitus, hypertension, male sex and a sedentary lifestyle.  Diabetes He presents for his follow-up diabetic visit. He has type 2 diabetes mellitus. Associated symptoms include fatigue and foot paresthesias. Pertinent negatives for diabetes include no blurred vision. Diabetic complications include peripheral neuropathy. Risk factors for coronary artery disease include dyslipidemia, diabetes mellitus, hypertension, male sex and sedentary lifestyle. He is following a generally unhealthy diet. His overall blood glucose range is 140-180 mg/dl.  Arthritis Presents for follow-up visit. He complains of pain and stiffness. Affected locations include the left knee, right knee, left MCP, right MCP, right shoulder and left shoulder. His pain is at a severity of 6/10. Associated symptoms include fatigue.  Depression        This is a chronic problem.  The current episode started more than 1 year ago.   The onset quality is gradual.   The problem occurs intermittently.  Associated symptoms include decreased concentration, fatigue, helplessness, hopelessness and sad.   Current opioids rx- Ultram 50 mg # meds rx- 20 Effectiveness of current meds-stable Adverse reactions from pain meds-constipation Morphine equivalent- 5  Pill count performed-No Last drug screen - 03/04/23 ( high risk q72m, moderate risk q50m, low risk yearly ) Urine drug screen today- No Was the [facility] reviewed- yes  If yes were their any concerning findings? - Does get oxycodone, does not take Ultram with the oxycodone     08/23/2019   10:44  AM  Opioid Risk   Alcohol 0  Illegal Drugs 0  Rx Drugs 0  Alcohol 0  Illegal Drugs 0  Rx Drugs 0  Age between 16-45 years  1  Psychological Disease 0  Depression 1  Opioid Risk Tool Scoring 2  Opioid Risk Interpretation Low Risk     Pain contract signed on:  03/03/22    Review of Systems  Constitutional:  Positive for fatigue. Negative for malaise/fatigue.  Eyes:  Negative for blurred vision.  Respiratory:  Negative for shortness of breath.   Gastrointestinal:  Positive for heartburn.  Musculoskeletal:  Positive for arthritis and stiffness.  Psychiatric/Behavioral:  Positive for decreased concentration and depression.   All other systems reviewed and are negative.   Family History  Problem Relation Age of Onset   Diabetes Mother    Hyperlipidemia Mother    Hypertension Mother    Cancer Father    Lung cancer Father    Heart disease Sister    Social History   Socioeconomic History   Marital status: Married    Spouse name: Matthew Pennington   Number of children: 3   Years of education: Not on file   Highest education level: Associate degree: academic program  Occupational History   Not on file  Tobacco Use   Smoking status: Never    Passive exposure: Past   Smokeless tobacco: Never  Vaping Use   Vaping Use: Never used  Substance and Sexual Activity   Alcohol use: No   Drug use: No   Sexual activity: Yes    Comment: getting limitted due to his pain  Other Topics Concern   Not on file  Social History Narrative   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   Social Determinants of Health   Financial Resource Strain: Low Risk  (03/15/2022)   Overall Financial Resource Strain (CARDIA)    Difficulty of Paying Living Expenses: Not hard at all  Food Insecurity: No Food Insecurity (03/15/2022)   Hunger Vital Sign    Worried About Running Out of Food in the Last Year: Never true    Ran Out of Food in the Last Year: Never true  Transportation Needs: No Transportation Needs (03/15/2022)   PRAPARE - Administrator, Civil Service (Medical): No    Lack of Transportation (Non-Medical): No  Physical Activity: Inactive (03/15/2022)   Exercise Vital Sign    Days of  Exercise per Week: 0 days    Minutes of Exercise per Session: 0 min  Stress: No Stress Concern Present (03/15/2022)   [employer] of Occupational Health - Occupational Stress Questionnaire    Feeling of Stress : Only a little  Social Connections: Socially Integrated (03/15/2022)   Social Connection and Isolation Panel [NHANES]    Frequency of Communication with Friends and Family: More than three times a week    Frequency of Social Gatherings with Friends and Family: More than three times a week    Attends Religious Services: More than 4 times per year    Active Member of [employer] or Organizations: Yes    Attends Engineer, structural: More than 4 times per year    Marital Status: Married       Objective:   Physical Exam Vitals reviewed.  Constitutional:      General: He is not in acute distress.    Appearance: He is well-developed. He is obese.  HENT:     Head: Normocephalic.     Right Ear: Tympanic membrane normal.     Left Ear: Tympanic membrane normal.  Eyes:     General:        Right eye: No discharge.        Left eye: No discharge.     Pupils: Pupils are equal, round, and reactive to light.  Neck:     Thyroid: No thyromegaly.  Cardiovascular:     Rate and Rhythm: Normal rate and regular rhythm.     Heart sounds: Normal heart sounds. No murmur heard. Pulmonary:     Effort: Pulmonary effort is normal. No respiratory distress.     Breath sounds: Normal breath sounds. No wheezing.  Abdominal:     General: Bowel sounds are normal. There is no distension.     Palpations: Abdomen is soft.     Tenderness: There is no abdominal tenderness.  Musculoskeletal:        General: No tenderness.     Cervical back: Normal range of motion and neck supple.     Comments: Pain in lumbar with flexion and extension  Skin:    General: Skin is warm and dry.     Findings: No erythema or rash.  Neurological:     Mental Status: He is alert and oriented to person, place, and  time.     Cranial Nerves: No cranial nerve deficit.     Deep Tendon Reflexes: Reflexes are normal and symmetric.  Psychiatric:        Mood and Affect: Affect is flat.        Behavior: Behavior normal.        Thought Content: Thought content normal.        Judgment: Judgment normal.       BP 131/72   Pulse 79   Temp 97.9 F (36.6 C) (Temporal)   Ht 6\' 1"  (1.854 m)   Wt 291 lb 9.6 oz (132.3 kg)   SpO2 95%   BMI 38.47 kg/m      Assessment & Plan:   Taisean Risko comes in today with chief complaint of Annual Exam (Wants to discuss depression wants a new meds) and Allergies   Diagnosis and orders addressed:  1. Hypertension associated with diabetes (HCC) - enalapril (VASOTEC) 20 MG tablet; Take 1 tablet (20 mg total) by mouth daily.  Dispense: 90 tablet; Refill: 0 - CMP14+EGFR - CBC with Differential/Platelet  2. Hyperlipidemia associated with type 2 diabetes mellitus (HCC) - ezetimibe (ZETIA) 10 MG tablet; Take 1 tablet (10 mg total) by mouth daily.  Dispense: 90 tablet; Refill: 0 - CMP14+EGFR - CBC with Differential/Platelet - Lipid panel  3. Trigeminal neuralgia - gabapentin (NEURONTIN) 300 MG capsule; Take 1 capsule (300 mg total) by mouth 3 (three) times daily.  Dispense: 270 capsule; Refill: 0 - ibuprofen (ADVIL) 800 MG tablet; Take 1 tablet (800 mg total) by mouth daily.  Dispense: 90 tablet; Refill: 0 - traMADol (ULTRAM) 50 MG tablet; Take 1 tablet (50 mg total) by mouth daily.  Dispense: 20 tablet; Refill: 2 - CMP14+EGFR - CBC with Differential/Platelet  4. Type 2 diabetes mellitus with other specified complication, without long-term current use of insulin (HCC) Start Farxiga 5 mg  - metFORMIN (GLUCOPHAGE) 500 MG tablet; TAKE 2 TABLETS BY MOUTH TWICE DAILY WITH A MEAL  Dispense: 360 tablet; Refill: 0 - Bayer DCA Hb A1c Waived - CMP14+EGFR - CBC with Differential/Platelet - Microalbumin / creatinine urine ratio  5.  Gastroesophageal reflux disease,  unspecified whether esophagitis present - pantoprazole (PROTONIX) 40 MG tablet; Take 1 tablet (40 mg total) by mouth daily.  Dispense: 90 tablet; Refill: 1 - CMP14+EGFR - CBC with Differential/Platelet  6. Leg cramps, sleep related - rOPINIRole (REQUIP) 0.25 MG tablet; TAKE 1 TO 2 TABLETS BY MOUTH AT BEDTIME FOR  RESTLESS  LEGS  Dispense: 180 tablet; Refill: 1 - CMP14+EGFR - CBC with Differential/Platelet  7. Chronic bilateral low back pain without sciatica - traMADol (ULTRAM) 50 MG tablet; Take 1 tablet (50 mg total) by mouth daily.  Dispense: 20 tablet; Refill: 2 - CMP14+EGFR - CBC with Differential/Platelet  8. Annual physical exam - Bayer DCA Hb A1c Waived - CMP14+EGFR - CBC with Differential/Platelet - Lipid panel - TSH - PSA, total and free - Microalbumin / creatinine urine ratio  9. Controlled substance agreement signed - CMP14+EGFR - CBC with Differential/Platelet  10. Fibromyalgia - CMP14+EGFR - CBC with Differential/Platelet  11. Moderate recurrent major depression (HCC) - CMP14+EGFR - CBC with Differential/Platelet - desvenlafaxine (PRISTIQ) 50 MG 24 hr tablet; Take 1 tablet (50 mg total) by mouth daily.  Dispense: 90 tablet; Refill: 0  12. OSA (obstructive sleep apnea) - CMP14+EGFR - CBC with Differential/Platelet  13. Statin myopathy - CMP14+EGFR - CBC with Differential/Platelet  14. Weakness - CMP14+EGFR - CBC with Differential/Platelet   Labs pending Start Farxiga 5 mg  Start Pristiq 50 mg  Stress management  Health Maintenance reviewed Diet and exercise encouraged  Follow up plan: 1 month depression and DM    [name], FNP

## 2022-11-04 ENCOUNTER — Other Ambulatory Visit: Payer: Self-pay | Admitting: Family

## 2022-11-04 LAB — CBC WITH DIFFERENTIAL/PLATELET
Basophils Absolute: 0 10*3/uL (ref 0.0–0.2)
Basos: 1 %
EOS (ABSOLUTE): 0.1 10*3/uL (ref 0.0–0.4)
Eos: 2 %
Hematocrit: 45.2 % (ref 37.5–51.0)
Hemoglobin: 15 g/dL (ref 13.0–17.7)
Immature Grans (Abs): 0 10*3/uL (ref 0.0–0.1)
Immature Granulocytes: 0 %
Lymphocytes Absolute: 2.4 10*3/uL (ref 0.7–3.1)
Lymphs: 43 %
MCH: 30.1 pg (ref 26.6–33.0)
MCHC: 33.2 g/dL (ref 31.5–35.7)
MCV: 91 fL (ref 79–97)
Monocytes Absolute: 0.5 10*3/uL (ref 0.1–0.9)
Monocytes: 8 %
Neutrophils Absolute: 2.6 10*3/uL (ref 1.4–7.0)
Neutrophils: 46 %
Platelets: 249 10*3/uL (ref 150–450)
RBC: 4.99 x10E6/uL (ref 4.14–5.80)
RDW: 13.4 % (ref 11.6–15.4)
WBC: 5.6 10*3/uL (ref 3.4–10.8)

## 2022-11-04 LAB — CMP14+EGFR
ALT: 81 IU/L — ABNORMAL HIGH (ref 0–44)
AST: 44 IU/L — ABNORMAL HIGH (ref 0–40)
Albumin/Globulin Ratio: 1.6 (ref 1.2–2.2)
Albumin: 4.4 g/dL (ref 3.8–4.9)
Alkaline Phosphatase: 62 IU/L (ref 44–121)
BUN/Creatinine Ratio: 14 (ref 9–20)
BUN: 14 mg/dL (ref 6–24)
Bilirubin Total: 0.3 mg/dL (ref 0.0–1.2)
CO2: 24 mmol/L (ref 20–29)
Calcium: 10 mg/dL (ref 8.7–10.2)
Chloride: 101 mmol/L (ref 96–106)
Creatinine, Ser: 0.98 mg/dL (ref 0.76–1.27)
Globulin, Total: 2.7 g/dL (ref 1.5–4.5)
Glucose: 181 mg/dL — ABNORMAL HIGH (ref 70–99)
Potassium: 4.8 mmol/L (ref 3.5–5.2)
Sodium: 140 mmol/L (ref 134–144)
Total Protein: 7.1 g/dL (ref 6.0–8.5)
eGFR: 90 mL/min/{1.73_m2} (ref >59)

## 2022-11-04 LAB — LIPID PANEL
Chol/HDL Ratio: 7.2 ratio — ABNORMAL HIGH (ref 0.0–5.0)
Cholesterol, Total: 217 mg/dL — ABNORMAL HIGH (ref 100–199)
HDL: 30 mg/dL — ABNORMAL LOW (ref >39)
LDL Chol Calc (NIH): 129 mg/dL — ABNORMAL HIGH (ref 0–99)
Triglycerides: 321 mg/dL — ABNORMAL HIGH (ref 0–149)
VLDL Cholesterol Cal: 58 mg/dL — ABNORMAL HIGH (ref 5–40)

## 2022-11-04 LAB — MICROALBUMIN / CREATININE URINE RATIO
Creatinine, Urine: 192.2 mg/dL
Microalb/Creat Ratio: 23 mg/g creat (ref 0–29)
Microalbumin, Urine: 44 ug/mL

## 2022-11-04 LAB — PSA, TOTAL AND FREE
PSA, Free Pct: 70 %
PSA, Free: 0.21 ng/mL
Prostate Specific Ag, Serum: 0.3 ng/mL (ref 0.0–4.0)

## 2022-11-04 LAB — TSH: TSH: 1.17 u[IU]/mL (ref 0.450–4.500)

## 2022-11-04 MED ORDER — SEMAGLUTIDE(0.25 OR 0.5MG/DOS) 2 MG/3ML ~~LOC~~ SOPN: 0.2500 mg | PEN_INJECTOR | SUBCUTANEOUS | 2 refills | Status: DC

## 2022-11-09 ENCOUNTER — Ambulatory Visit: Payer: Medicare Other | Admitting: Podiatry

## 2022-11-23 ENCOUNTER — Encounter: Payer: Self-pay | Admitting: Podiatry

## 2022-11-23 ENCOUNTER — Ambulatory Visit: Payer: Medicare Other | Admitting: Podiatry

## 2022-11-23 ENCOUNTER — Ambulatory Visit: Payer: Medicare Other

## 2022-11-23 DIAGNOSIS — M7661 Achilles tendinitis, right leg: Secondary | ICD-10-CM | POA: Diagnosis not present

## 2022-11-23 DIAGNOSIS — R52 Pain, unspecified: Secondary | ICD-10-CM

## 2022-11-23 MED ORDER — TRIAMCINOLONE ACETONIDE 10 MG/ML IJ SUSP
10.0000 mg | Freq: Once | INTRAMUSCULAR | Status: AC
Start: 2022-11-23 — End: 2022-11-23
  Administered 2022-11-23: 10 mg

## 2022-11-24 NOTE — Progress Notes (Signed)
Subjective:   Patient ID: Matthew Pennington, male   DOB: 57 y.o.   MRN: [id number]   HPI Patient states he was doing great and that he just started to develop discomfort again in the outside of his right Achilles tendon   ROS      Objective:  Physical Exam  Neurovascular status intact with patient found to have inflammation and a small enlargement nodule of bone on the lateral side of the left Achilles but the pain is not quite in this area but a little bit more lateral than this     Assessment:  Continues to experience an inflammatory condition on the lateral side of this right Achilles insertion     Plan:  H&P reviewed all different options discussed shockwave therapy discussed PRP discussed surgery he simply cannot do any of these at this time and he is desperate for short-term relief to get him through the summer.  He understands completely the risk of injection and I did make his very aware to him and he simply does not have any other options and wants to try to get the pain under control as it works very well for about 4 months.  Patient at this time had a very careful injection of just a half a cc dexamethasone Kenalog 5 mg Xylocaine right posterior lateral heel and then had air fracture walker that he will wear at this time.  He does not want to do shockwave and is hoping to be able to do it in the fall depending on his whether he can financially do it but I do think this would be his best alternative

## 2022-11-29 ENCOUNTER — Ambulatory Visit: Payer: Medicare Other | Admitting: Family

## 2022-12-03 DIAGNOSIS — G4733 Obstructive sleep apnea (adult) (pediatric): Secondary | ICD-10-CM | POA: Diagnosis not present

## 2022-12-12 DIAGNOSIS — M47812 Spondylosis without myelopathy or radiculopathy, cervical region: Secondary | ICD-10-CM | POA: Diagnosis not present

## 2022-12-12 DIAGNOSIS — S338XXA Sprain of other parts of lumbar spine and pelvis, initial encounter: Secondary | ICD-10-CM | POA: Diagnosis not present

## 2022-12-12 DIAGNOSIS — S29012A Strain of muscle and tendon of back wall of thorax, initial encounter: Secondary | ICD-10-CM | POA: Diagnosis not present

## 2022-12-12 DIAGNOSIS — M542 Cervicalgia: Secondary | ICD-10-CM | POA: Diagnosis not present

## 2022-12-12 DIAGNOSIS — M47816 Spondylosis without myelopathy or radiculopathy, lumbar region: Secondary | ICD-10-CM | POA: Diagnosis not present

## 2022-12-12 DIAGNOSIS — M9902 Segmental and somatic dysfunction of thoracic region: Secondary | ICD-10-CM | POA: Diagnosis not present

## 2022-12-12 DIAGNOSIS — M9903 Segmental and somatic dysfunction of lumbar region: Secondary | ICD-10-CM | POA: Diagnosis not present

## 2022-12-12 DIAGNOSIS — M9901 Segmental and somatic dysfunction of cervical region: Secondary | ICD-10-CM | POA: Diagnosis not present

## 2022-12-19 ENCOUNTER — Telehealth: Payer: Self-pay | Admitting: Family

## 2022-12-19 DIAGNOSIS — G4733 Obstructive sleep apnea (adult) (pediatric): Secondary | ICD-10-CM

## 2022-12-19 NOTE — Telephone Encounter (Signed)
Please fax sleep study.

## 2022-12-19 NOTE — Addendum Note (Signed)
Addended by: Ignacia Bayley on: 12/19/2022 04:55 PM   Modules accepted: Orders

## 2022-12-19 NOTE — Telephone Encounter (Signed)
Spouse called Pt needs sleep study rx and records sent to [facility] in [place] fax [phone]. [facility] will no longer accept pt insurance [employer]. Pt asked that if there were any questions, have Morrie Sheldon C call.

## 2022-12-19 NOTE — Telephone Encounter (Signed)
Fax sent.

## 2022-12-20 NOTE — Progress Notes (Deleted)
Office Visit Note  Patient: Matthew Pennington             Date of Birth: 30-Oct-1965           MRN: [id number]             PCP: Junie Spencer, FNP Referring: Junie Spencer, FNP Visit Date: 12/26/2022   Subjective:  No chief complaint on file.   History of Present Illness: Matthew Pennington is a 57 y.o. male here for follow up for joint pain in multiple areas especially recurrent issues affecting shoulders elbows and his thumbs.    Previous HPI 07/26/2022 Matthew Pennington is a 57 y.o. male here for follow up for joint pain in multiple areas especially recurrent issues affecting shoulders elbows and his thumbs.  Laboratory test at initial visit were negative for RA antibodies serum inflammatory markers unremarkable with very slightly high sed rate of 22.  Shoulder x-rays without significant appearing glenohumeral joint osteoarthritis but looks like probable chronic injury related changes at right [facility] joint and probable early calcific tendinitis changes.  He continues having similar ongoing symptoms as before.  I have a lot of pain both in the joints and extending into muscle areas.  He is scheduled for repeat injection for swelling around the Achilles on his right ankle.  Still having the persistent fatigue little exertion tolerance irritable bowel symptoms and concentration difficulty.  Also thinks trigeminal neuralgia may be increased with pain in the lower distribution on his left side down to the jaw and chin.   Previous HPI 06/22/22 Matthew Pennington is a 57 y.o. male here for evaluation with chronic joint pains and concern for fibromyalgia syndrome. He has chronic musculoskeletal pain with low back pain, bilateral lateral epicondylitis, and some generalized pain and weakness.  He has had significant joint pain for several years but this is particularly bothersome for about the past 1 year.  Has really had pain and soreness both localized to the joints and pretty diffusely at times.  Shoulder pain is  particularly worse with having to reach overhead or weightbearing to pick up any kind of heavy object.  Also experiences pain at the shoulders and hips if there is pressure on these areas while sleeping worse on his left side than the right.  He has had repeated episodes with pain in both elbows both with use and severe tenderness to any pressure on the area.  He also frequently has pain in his hands occasionally with some swelling or increased fluid.  Frequently has pain in his low back and knees with some existing osteoarthritis.  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 He has peripheral neuropathy in bilateral feet more severe on the left side. He has episodic pain related to trigeminal neuralgia this has been excruciating in severity but had a good improvement with addition of gabapentin and oxcarbazepine.  He experiences occasional dizziness or lightheadedness but not always associated with positional change.  He does not have many headaches outside of the trigeminal neuralgia pain. 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 Negative labs screening for RA and gout in 03/2020. Outside of the specific complaints he has generally felt less well including poor mood and irritability. He sleeps with CPAP but also has nighttime awakenings on most nights without a specific cause that he can tell.  He has some  baseline degree of fatigue on most days also experiences episodes of taking up to 1 or 2 days to recover after increase in physical exertion on any particular day's activity. He has had frequent issues with diarrhea and loose stools not associated with any kind of abdominal pain mucus or blood in his bowel movements.   No Rheumatology ROS completed.   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   Polyarthralgia 06/22/2022   Ureteral calculus, right 04/27/2022   Nephrolithiasis 04/27/2022   Osteoarthritis of carpometacarpal (CMC) joint of thumb 04/12/2022   Statin myopathy 03/03/2022   Right lateral epicondylitis 02/11/2022   Left lateral epicondylitis 02/11/2022   Arthritis of carpometacarpal (CMC) joint of right thumb 06/08/2021   Hepatic steatosis 12/24/2019   Gastroesophageal reflux disease 08/23/2019   Controlled substance agreement signed 08/23/2019   Moderate recurrent major depression (HCC) 08/23/2019   OSA (obstructive sleep apnea) 12/05/2017   GAD (generalized anxiety disorder) 11/24/2017   Leg cramps, sleep related 11/24/2017   Hypertension associated with diabetes (HCC) 03/29/2017   Diabetes mellitus (HCC) 03/29/2017   Hyperlipidemia associated with type 2 diabetes mellitus (HCC) 03/29/2017   Diverticulosis of large intestine without hemorrhage 03/29/2017   History of prostatitis 03/29/2017   Light headedness    Tingling    Weakness    Trigeminal neuralgia     Past Medical History:  Diagnosis Date   Diabetes mellitus without complication (HCC)    H/O echocardiogram 09/2017   normal EF   Headache    Hyperlipidemia    Hypertension    Light headedness    OSA on CPAP    Palpitations    Tingling    toes   Trigeminal neuralgia     Family History  Problem Relation Age of Onset   Diabetes Mother    Hyperlipidemia Mother    Hypertension Mother    Cancer Father    Lung cancer Father    Heart disease Sister    Past Surgical History:  Procedure Laterality Date   NO PAST SURGERIES     Social History   Social History Narrative   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   Immunization History  Administered Date(s) Administered   Influenza Split 02/13/2017   Influenza,inj,Quad PF,6+ Mos 02/21/2022   Influenza,inj,quad, With Preservative  02/13/2017, 02/20/2018, 02/05/2019   Influenza-Unspecified 01/29/2020, 03/30/2021     Objective: Vital Signs: There were no vitals taken for this visit.   Physical Exam   Musculoskeletal Exam: ***  CDAI Exam: CDAI Score: -- Patient Global: --; Provider Global: -- Swollen: --; Tender: -- Joint Exam 12/26/2022   No joint exam has been documented for this visit   There is currently no information documented on the homunculus. Go to the Rheumatology activity and complete the homunculus joint exam.  Investigation: No additional findings.  Imaging: No results found.  Recent Labs: Lab Results  Component Value Date   WBC 5.6 11/03/2022   HGB 15.0 11/03/2022   PLT 249 11/03/2022   NA 140 11/03/2022   K 4.8 11/03/2022   CL 101 11/03/2022   CO2 24 11/03/2022   GLUCOSE 181 (H) 11/03/2022   BUN 14 11/03/2022   CREATININE 0.98 11/03/2022   BILITOT 0.3 11/03/2022   ALKPHOS 62 11/03/2022   AST 44 (H) 11/03/2022   ALT 81 (H) 11/03/2022  PROT 7.1 11/03/2022   ALBUMIN 4.4 11/03/2022   CALCIUM 10.0 11/03/2022   GFRAA 103 04/09/2020    Speciality Comments: No specialty comments available.  Procedures:  No procedures performed Allergies: Morphine and codeine and Statins   Assessment / Plan:     Visit Diagnoses: No diagnosis found.  ***  Orders: No orders of the defined types were placed in this encounter.  No orders of the defined types were placed in this encounter.    Follow-Up Instructions: No follow-ups on file.   Metta Clines, RT  Note - This record has been created using [employer].  Chart creation errors have been sought, but may not always  have been located. Such creation errors do not reflect on  the standard of medical care.

## 2022-12-23 ENCOUNTER — Telehealth: Payer: Self-pay | Admitting: Family

## 2022-12-23 NOTE — Telephone Encounter (Signed)
Reprinted CPAP order.

## 2022-12-23 NOTE — Telephone Encounter (Signed)
Reprinted CPAP order, waiting for PCP to sign

## 2022-12-26 ENCOUNTER — Ambulatory Visit: Payer: Medicare Other | Admitting: Internal Medicine

## 2022-12-26 DIAGNOSIS — M2550 Pain in unspecified joint: Secondary | ICD-10-CM

## 2022-12-26 DIAGNOSIS — M797 Fibromyalgia: Secondary | ICD-10-CM

## 2022-12-26 DIAGNOSIS — M1811 Unilateral primary osteoarthritis of first carpometacarpal joint, right hand: Secondary | ICD-10-CM

## 2022-12-26 DIAGNOSIS — M6520 Calcific tendinitis, unspecified site: Secondary | ICD-10-CM

## 2022-12-26 DIAGNOSIS — E1169 Type 2 diabetes mellitus with other specified complication: Secondary | ICD-10-CM

## 2022-12-27 NOTE — Telephone Encounter (Signed)
Order faxed to [facility] at [phone]

## 2023-01-02 ENCOUNTER — Ambulatory Visit (INDEPENDENT_AMBULATORY_CARE_PROVIDER_SITE_OTHER): Payer: Medicare Other | Admitting: Nurse Practitioner

## 2023-01-02 ENCOUNTER — Encounter: Payer: Self-pay | Admitting: Nurse Practitioner

## 2023-01-02 ENCOUNTER — Ambulatory Visit (INDEPENDENT_AMBULATORY_CARE_PROVIDER_SITE_OTHER): Payer: Medicare Other

## 2023-01-02 VITALS — BP 111/74 | HR 77 | Temp 97.7°F | Ht 73.0 in | Wt 290.8 lb

## 2023-01-02 DIAGNOSIS — M797 Fibromyalgia: Secondary | ICD-10-CM

## 2023-01-02 DIAGNOSIS — M25552 Pain in left hip: Secondary | ICD-10-CM

## 2023-01-02 DIAGNOSIS — M25562 Pain in left knee: Secondary | ICD-10-CM | POA: Diagnosis not present

## 2023-01-02 MED ORDER — DICLOFENAC SODIUM 1 % EX GEL
4.0000 g | Freq: Four times a day (QID) | CUTANEOUS | 2 refills | Status: DC
Start: 2023-01-02 — End: 2023-11-07

## 2023-01-02 NOTE — Progress Notes (Signed)
Acute Office Visit  Subjective:     Patient ID: Matthew Pennington, male    DOB: 09/16/1965, 57 y.o.   MRN: [id number]  Chief Complaint  Patient presents with   Hip Pain    Left hip pain for few weeks. Has been taking 800mg  ibuprofen with minimal relief   Knee Pain    Left knee pain for few weeks   Left hip pain that radiates to lower legs and left knee pain HPI Hip Pain: Patient complains of left hip pain. Onset of the symptoms was several weeks ago. Inciting event: none. Current symptoms include is worse with weight bearing and radiates to left legs . Associated symptoms: none, popping sensation. Aggravating symptoms: going up and down stairs, kneeling, and standing. Patient's course of pain: intermittent. Patient has had no prior hip problems. Previous visits for this problem: none. Evaluation to date: plain films, which were awaiting final radiologist report .  Treatment to date: OTC analgesics, which have been somewhat effective. Active Ambulatory Problems    Diagnosis Date Noted   Light headedness    Tingling    Weakness    Trigeminal neuralgia    Hypertension associated with diabetes (HCC) 03/29/2017   Diabetes mellitus (HCC) 03/29/2017   Hyperlipidemia associated with type 2 diabetes mellitus (HCC) 03/29/2017   Diverticulosis of large intestine without hemorrhage 03/29/2017   History of prostatitis 03/29/2017   GAD (generalized anxiety disorder) 11/24/2017   Leg cramps, sleep related 11/24/2017   OSA (obstructive sleep apnea) 12/05/2017   Gastroesophageal reflux disease 08/23/2019   Controlled substance agreement signed 08/23/2019   Moderate recurrent major depression (HCC) 08/23/2019   Hepatic steatosis 12/24/2019   Arthritis of carpometacarpal [facility]) joint of right thumb 06/08/2021   Right lateral epicondylitis 02/11/2022   Left lateral epicondylitis 02/11/2022   Statin myopathy 03/03/2022   Ureteral calculus, right 04/27/2022   Nephrolithiasis 04/27/2022   Polyarthralgia  06/22/2022   Osteoarthritis of carpometacarpal (CMC) joint of thumb 04/12/2022   Fibromyalgia 07/26/2022   Calcific tendonitis 07/26/2022   Acute pain of left knee 01/02/2023   Pain of left hip 01/02/2023   Resolved Ambulatory Problems    Diagnosis Date Noted   Hypertension    Acute pain of right knee 10/21/2019   Past Medical History:  Diagnosis Date   Diabetes mellitus without complication (HCC)    H/O echocardiogram 09/2017   Headache    Hyperlipidemia    OSA on CPAP    Palpitations      ROS Negative unless indicated in HPI Lab Results  Component Value Date   HGBA1C 8.9 (H) 11/03/2022   HGBA1C 6.9 (H) 09/01/2021   HGBA1C 7.1 (H) 06/01/2021   Lab Results  Component Value Date   LDLCALC 129 (H) 11/03/2022   CREATININE 0.98 11/03/2022      Objective:    BP 111/74   Pulse 77   Temp 97.7 F (36.5 C) (Temporal)   Ht 6\' 1"  (1.854 m)   Wt 290 lb 12.8 oz (131.9 kg)   SpO2 96%   BMI 38.37 kg/m  BP Readings from Last 3 Encounters:  01/02/23 111/74  11/03/22 131/72  09/26/22 133/87   Wt Readings from Last 3 Encounters:  01/02/23 290 lb 12.8 oz (131.9 kg)  11/03/22 291 lb 9.6 oz (132.3 kg)  09/26/22 293 lb (132.9 kg)      Physical Exam Constitutional:      Appearance: Normal appearance. He is obese.  HENT:     Head: Normocephalic and atraumatic.  Eyes:     Extraocular Movements: Extraocular movements intact.     Conjunctiva/sclera: Conjunctivae normal.     Pupils: Pupils are equal, round, and reactive to light.  Cardiovascular:     Rate and Rhythm: Normal rate and regular rhythm.  Pulmonary:     Effort: Pulmonary effort is normal.     Breath sounds: Normal breath sounds.  Musculoskeletal:     Right hip: Normal.     Left hip: Tenderness present. Decreased range of motion.     Right lower leg: No edema.     Left lower leg: No edema.     Comments: He is a cane for mobility  Skin:    General: Skin is warm and dry.  Neurological:     General: No focal  deficit present.     Mental Status: He is alert and oriented to person, place, and time. Mental status is at baseline.  Psychiatric:        Mood and Affect: Mood normal.        Behavior: Behavior normal.        Thought Content: Thought content normal.        Judgment: Judgment normal.    No results found for any visits on 01/02/23.      Assessment & Plan:  Pain of left hip -     DG HIP UNILAT W OR W/O PELVIS 2-3 VIEWS LEFT -     Sedimentation rate  Acute pain of left knee -     DG Knee 1-2 Views Left -     Sedimentation rate  Fibromyalgia -     Diclofenac Sodium; Apply 4 g topically 4 (four) times daily.  Dispense: 350 g; Refill: 2   Salman was seen today for left hip pain, no acute distress stress  Left hip and left knee x-ray ordered, awaiting for final report from radiologist Client is already on oxycodone, Voltaren gel, Neurontin as such no medication will be prescribed today, knee brace provided to client If no relief may consider physical therapy in the future Plan of care discussed with client all questions answered  Return if symptoms worsen or fail to improve, for with PCP.  Arrie Aran Santa Lighter, DNP [facility] [address] [place], [place] [address] [phone]

## 2023-01-03 ENCOUNTER — Telehealth (INDEPENDENT_AMBULATORY_CARE_PROVIDER_SITE_OTHER): Payer: Medicare Other | Admitting: Family

## 2023-01-03 ENCOUNTER — Encounter: Payer: Self-pay | Admitting: Family

## 2023-01-03 DIAGNOSIS — M25552 Pain in left hip: Secondary | ICD-10-CM

## 2023-01-03 DIAGNOSIS — M79675 Pain in left toe(s): Secondary | ICD-10-CM | POA: Diagnosis not present

## 2023-01-03 DIAGNOSIS — M25562 Pain in left knee: Secondary | ICD-10-CM | POA: Diagnosis not present

## 2023-01-03 DIAGNOSIS — E1169 Type 2 diabetes mellitus with other specified complication: Secondary | ICD-10-CM | POA: Diagnosis not present

## 2023-01-03 MED ORDER — SEMAGLUTIDE(0.25 OR 0.5MG/DOS) 2 MG/3ML ~~LOC~~ SOPN
0.5000 mg | PEN_INJECTOR | SUBCUTANEOUS | 2 refills | Status: DC
Start: 2023-01-03 — End: 2023-07-28

## 2023-01-03 MED ORDER — PREDNISONE 20 MG PO TABS
40.0000 mg | ORAL_TABLET | Freq: Every day | ORAL | 0 refills | Status: AC
Start: 2023-01-03 — End: 2023-01-08

## 2023-01-03 NOTE — Progress Notes (Signed)
Virtual Visit Consent   Matthew Pennington, you are scheduled for a virtual visit with a [facility] provider today. Just as with appointments in the office, your consent must be obtained to participate. Your consent will be active for this visit and any virtual visit you may have with one of our providers in the next 365 days. If you have a MyChart account, a copy of this consent can be sent to you electronically.  As this is a virtual visit, video technology does not allow for your provider to perform a traditional examination. This may limit your provider's ability to fully assess your condition. If your provider identifies any concerns that need to be evaluated in person or the need to arrange testing (such as labs, EKG, etc.), we will make arrangements to do so. Although advances in technology are sophisticated, we cannot ensure that it will always work on either your end or our end. If the connection with a video visit is poor, the visit may have to be switched to a telephone visit. With either a video or telephone visit, we are not always able to ensure that we have a secure connection.  By engaging in this virtual visit, you consent to the provision of healthcare and authorize for your insurance to be billed (if applicable) for the services provided during this visit. Depending on your insurance coverage, you may receive a charge related to this service.  I need to obtain your verbal consent now. Are you willing to proceed with your visit today? Matthew Pennington has provided verbal consent on 01/03/2023 for a virtual visit (video or telephone). Matthew Rodney, FNP  Date: 01/03/2023 4:53 PM  Virtual Visit via Video Note   I, Matthew Pennington, connected with  Matthew Pennington  ([id number], [birth year]) on 01/03/23 at  5:15 PM EDT by a video-enabled telemedicine application and verified that I am speaking with the correct person using two identifiers.  Location: Patient: Virtual Visit Location Patient: Home Provider:  Virtual Visit Location Provider: Home Office   I discussed the limitations of evaluation and management by telemedicine and the availability of in person appointments. The patient expressed understanding and agreed to proceed.    History of Present Illness: Matthew Pennington is a 57 y.o. who identifies as a male who was assigned male at birth, and is being seen today for left hip and left knee pain. He was seen yesterday and given Voltaren gel with mild relief.   He is now complaining of left great toe and foot pain that started yesterday that has worsen. Reports erythemas and warmth. States he can not walk on his foot.    He is a diabetic and his last A1C was 8.9. Prior to his pain his glucose was 130's.  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 04/12/2022   Statin myopathy 03/03/2022   Right lateral epicondylitis 02/11/2022   Left lateral epicondylitis 02/11/2022   Arthritis of carpometacarpal (CMC) joint of right thumb 06/08/2021   Hepatic steatosis 12/24/2019   Gastroesophageal reflux disease 08/23/2019   Controlled substance agreement signed 08/23/2019   Moderate recurrent major depression (HCC) 08/23/2019   OSA (obstructive sleep apnea) 12/05/2017   GAD (generalized anxiety  disorder) 11/24/2017   Leg cramps, sleep related 11/24/2017   Hypertension associated with diabetes (HCC) 03/29/2017   Diabetes mellitus (HCC) 03/29/2017   Hyperlipidemia associated with type 2 diabetes mellitus (HCC) 03/29/2017   Diverticulosis of large intestine without hemorrhage 03/29/2017   History of prostatitis 03/29/2017   Light headedness    Tingling    Weakness    Trigeminal neuralgia      Allergies:  Allergies  Allergen Reactions   Morphine And Codeine Other (See Comments)    "Makes me go Crazy"   Statins Other (See Comments)    Joint/mucles pains   Medications:  Current Outpatient Medications:    predniSONE (DELTASONE) 20 MG tablet, Take 2 tablets (40 mg total) by mouth daily with breakfast for 5 days., Disp: 10 tablet, Rfl: 0   Semaglutide,0.25 or 0.5MG /DOS, 2 MG/3ML SOPN, Inject 0.5 mg into the skin once a week., Disp: 3 mL, Rfl: 2   Blood Glucose Calibration (ONETOUCH VERIO) High SOLN, , Disp: , Rfl:    Blood Glucose Monitoring Suppl (ONETOUCH VERIO REFLECT) w/Device KIT, , Disp: , Rfl:    desvenlafaxine (PRISTIQ) 50 MG 24 hr tablet, Take 1 tablet (50 mg total) by mouth daily., Disp: 90 tablet, Rfl: 0   diclofenac Sodium (VOLTAREN) 1 % GEL, Apply 4 g topically 4 (four) times daily., Disp: 350 g, Rfl: 2   enalapril (VASOTEC) 20 MG tablet, Take 1 tablet (20 mg total) by mouth daily., Disp: 90 tablet, Rfl: 0   ezetimibe (ZETIA) 10 MG tablet, Take 1 tablet (10 mg total) by mouth daily., Disp: 90 tablet, Rfl: 0   gabapentin (NEURONTIN) 300 MG capsule, Take 1 capsule (300 mg total) by mouth 3 (three) times daily., Disp: 270 capsule, Rfl: 0   glucose blood (ONETOUCH VERIO) test strip, , Disp: , Rfl:    ibuprofen (ADVIL) 800 MG tablet, Take 1 tablet (800 mg total) by mouth daily., Disp: 90 tablet, Rfl: 0   Lancet Devices (SIMPLE DIAGNOSTICS LANCING DEV) MISC, Apply topically., Disp: , Rfl:    metFORMIN (GLUCOPHAGE) 500 MG tablet, TAKE 2 TABLETS BY MOUTH TWICE DAILY WITH A MEAL, Disp: 360 tablet, Rfl: 0   oxcarbazepine (TRILEPTAL) 600 MG tablet, Take 1 tablet (600 mg total) by mouth 3 (three) times daily., Disp: 270 tablet, Rfl: 2   oxyCODONE-acetaminophen (PERCOCET/ROXICET) 5-325 MG tablet, Take 1 tablet by mouth every 6 (six) hours as needed for severe pain., Disp: 20 tablet, Rfl: 0   pantoprazole (PROTONIX) 40 MG tablet, Take 1 tablet (40 mg total) by mouth daily., Disp: 90  tablet, Rfl: 1   rOPINIRole (REQUIP) 0.25 MG tablet, TAKE 1 TO 2 TABLETS BY MOUTH AT BEDTIME FOR  RESTLESS  LEGS, Disp: 180 tablet, Rfl: 1   traMADol (ULTRAM) 50 MG tablet, Take 1 tablet (50 mg total) by mouth daily., Disp: 20 tablet, Rfl: 2  Observations/Objective: Patient is well-developed, well-nourished in no acute distress.  Resting comfortably  at home.  Head is normocephalic, atraumatic.  No labored breathing.  Speech is clear and coherent with logical content.  Patient is alert and oriented at baseline.  Left great toe erythemas, warmth and tenderness  Assessment and Plan: 1. Type 2 diabetes mellitus with other specified complication, without long-term current use of insulin (HCC) - Semaglutide,0.25 or 0.5MG /DOS, 2 MG/3ML SOPN; Inject 0.5 mg into the skin once a week.  Dispense: 3 mL; Refill: 2  2. Great toe pain, left - predniSONE (DELTASONE) 20 MG tablet; Take 2 tablets (40 mg total) by  mouth daily with breakfast for 5 days.  Dispense: 10 tablet; Refill: 0  3. Left hip pain - predniSONE (DELTASONE) 20 MG tablet; Take 2 tablets (40 mg total) by mouth daily with breakfast for 5 days.  Dispense: 10 tablet; Refill: 0  4. Pain of left hip  5. Acute pain of left knee  Will increase Ozempic to 0.5 mg from 0.25 mg  Strict low carb diet More than likely gout- start prednisolone  Low purine diet  Force fluids Prednisone will also help hip and keep  Continue NSAID's as needed  Follow up as needed    Follow Up Instructions: I discussed the assessment and treatment plan with the patient. The patient was provided an opportunity to ask questions and all were answered. The patient agreed with the plan and demonstrated an understanding of the instructions.  A copy of instructions were sent to the patient via MyChart unless otherwise noted below.     The patient was advised to call back or seek an in-person evaluation if the symptoms worsen or if the condition fails to improve as  anticipated.  Time:  I spent 11 minutes with the patient via telehealth technology discussing the above problems/concerns.    Matthew Rodney, FNP

## 2023-01-05 ENCOUNTER — Encounter: Payer: Self-pay | Admitting: *Deleted

## 2023-01-09 DIAGNOSIS — M79672 Pain in left foot: Secondary | ICD-10-CM | POA: Diagnosis not present

## 2023-01-09 DIAGNOSIS — M109 Gout, unspecified: Secondary | ICD-10-CM | POA: Diagnosis not present

## 2023-01-09 DIAGNOSIS — M79675 Pain in left toe(s): Secondary | ICD-10-CM | POA: Diagnosis not present

## 2023-01-09 DIAGNOSIS — M10072 Idiopathic gout, left ankle and foot: Secondary | ICD-10-CM | POA: Diagnosis not present

## 2023-01-09 DIAGNOSIS — R52 Pain, unspecified: Secondary | ICD-10-CM | POA: Diagnosis not present

## 2023-01-11 DIAGNOSIS — M9901 Segmental and somatic dysfunction of cervical region: Secondary | ICD-10-CM | POA: Diagnosis not present

## 2023-01-11 DIAGNOSIS — M9902 Segmental and somatic dysfunction of thoracic region: Secondary | ICD-10-CM | POA: Diagnosis not present

## 2023-01-11 DIAGNOSIS — M542 Cervicalgia: Secondary | ICD-10-CM | POA: Diagnosis not present

## 2023-01-11 DIAGNOSIS — S29012A Strain of muscle and tendon of back wall of thorax, initial encounter: Secondary | ICD-10-CM | POA: Diagnosis not present

## 2023-01-11 DIAGNOSIS — M47816 Spondylosis without myelopathy or radiculopathy, lumbar region: Secondary | ICD-10-CM | POA: Diagnosis not present

## 2023-01-11 DIAGNOSIS — M9903 Segmental and somatic dysfunction of lumbar region: Secondary | ICD-10-CM | POA: Diagnosis not present

## 2023-01-11 DIAGNOSIS — S338XXA Sprain of other parts of lumbar spine and pelvis, initial encounter: Secondary | ICD-10-CM | POA: Diagnosis not present

## 2023-01-11 DIAGNOSIS — M47812 Spondylosis without myelopathy or radiculopathy, cervical region: Secondary | ICD-10-CM | POA: Diagnosis not present

## 2023-01-18 ENCOUNTER — Telehealth: Payer: Self-pay | Admitting: Family

## 2023-01-18 DIAGNOSIS — G4733 Obstructive sleep apnea (adult) (pediatric): Secondary | ICD-10-CM

## 2023-01-18 NOTE — Telephone Encounter (Signed)
REFERRAL REQUEST Telephone Note  Have you been seen at our office for this problem? yes (Advise that they may need an appointment with their PCP before a referral can be done)  Reason for Referral: sleep study pt is due for a new cpap machine Referral discussed with patient: yes  Best contact number of patient for referral team: [phone]    Has patient been seen by a specialist for this issue before: yes, but he is retired  Patient provider preference for referral: no Patient location preference for referral:[place] or [facility]   Patient notified that referrals can take up to a week or longer to process. If they haven't heard anything within a week they should call back and speak with the referral department.

## 2023-01-20 NOTE — Telephone Encounter (Signed)
Please Review

## 2023-01-20 NOTE — Telephone Encounter (Signed)
Please fax rx.  ? ?Christy Hawks, FNP ? ?

## 2023-01-20 NOTE — Telephone Encounter (Signed)
faxed

## 2023-01-20 NOTE — Telephone Encounter (Signed)
Please fax rx

## 2023-01-24 DIAGNOSIS — G4733 Obstructive sleep apnea (adult) (pediatric): Secondary | ICD-10-CM

## 2023-01-25 ENCOUNTER — Other Ambulatory Visit: Payer: Self-pay

## 2023-01-25 DIAGNOSIS — Z1212 Encounter for screening for malignant neoplasm of rectum: Secondary | ICD-10-CM

## 2023-01-27 DIAGNOSIS — G4733 Obstructive sleep apnea (adult) (pediatric): Secondary | ICD-10-CM | POA: Diagnosis not present

## 2023-02-02 DIAGNOSIS — S29012A Strain of muscle and tendon of back wall of thorax, initial encounter: Secondary | ICD-10-CM | POA: Diagnosis not present

## 2023-02-02 DIAGNOSIS — M47816 Spondylosis without myelopathy or radiculopathy, lumbar region: Secondary | ICD-10-CM | POA: Diagnosis not present

## 2023-02-02 DIAGNOSIS — M9901 Segmental and somatic dysfunction of cervical region: Secondary | ICD-10-CM | POA: Diagnosis not present

## 2023-02-02 DIAGNOSIS — M542 Cervicalgia: Secondary | ICD-10-CM | POA: Diagnosis not present

## 2023-02-02 DIAGNOSIS — S338XXA Sprain of other parts of lumbar spine and pelvis, initial encounter: Secondary | ICD-10-CM | POA: Diagnosis not present

## 2023-02-02 DIAGNOSIS — M47812 Spondylosis without myelopathy or radiculopathy, cervical region: Secondary | ICD-10-CM | POA: Diagnosis not present

## 2023-02-02 DIAGNOSIS — M9902 Segmental and somatic dysfunction of thoracic region: Secondary | ICD-10-CM | POA: Diagnosis not present

## 2023-02-02 DIAGNOSIS — M9903 Segmental and somatic dysfunction of lumbar region: Secondary | ICD-10-CM | POA: Diagnosis not present

## 2023-02-06 ENCOUNTER — Encounter (INDEPENDENT_AMBULATORY_CARE_PROVIDER_SITE_OTHER): Payer: Medicare Other

## 2023-02-06 DIAGNOSIS — G4733 Obstructive sleep apnea (adult) (pediatric): Secondary | ICD-10-CM | POA: Diagnosis not present

## 2023-02-09 NOTE — Telephone Encounter (Signed)
Hello, We have resent prescription. Let us know if you continue to have any other problems.   [name], FNP  Please fax form to pharmacy.     Approximately 5 minutes was spent documenting and reviewing patient's chart.

## 2023-02-21 ENCOUNTER — Ambulatory Visit (INDEPENDENT_AMBULATORY_CARE_PROVIDER_SITE_OTHER): Payer: Medicare Other | Admitting: Nurse Practitioner

## 2023-02-21 ENCOUNTER — Encounter: Payer: Self-pay | Admitting: Nurse Practitioner

## 2023-02-21 VITALS — BP 116/78 | HR 71 | Temp 97.7°F | Resp 20 | Ht 73.0 in | Wt 289.0 lb

## 2023-02-21 DIAGNOSIS — M545 Low back pain, unspecified: Secondary | ICD-10-CM

## 2023-02-21 LAB — URINALYSIS, COMPLETE
Bilirubin, UA: NEGATIVE
Leukocytes,UA: NEGATIVE
Nitrite, UA: NEGATIVE
RBC, UA: NEGATIVE
Specific Gravity, UA: >1.03 — ABNORMAL HIGH (ref 1.005–1.030)
Urobilinogen, Ur: 0.2 mg/dL (ref 0.2–1.0)
pH, UA: 5.5 (ref 5.0–7.5)

## 2023-02-21 LAB — MICROSCOPIC EXAMINATION
RBC, Urine: NONE SEEN /hpf (ref 0–2)
Renal Epithel, UA: NONE SEEN /hpf

## 2023-02-21 MED ORDER — KETOROLAC TROMETHAMINE 60 MG/2ML IM SOLN
60.0000 mg | Freq: Once | INTRAMUSCULAR | Status: AC
Start: 2023-02-21 — End: 2023-02-21
  Administered 2023-02-21: 60 mg via INTRAMUSCULAR

## 2023-02-21 MED ORDER — METHYLPREDNISOLONE ACETATE 80 MG/ML IJ SUSP
80.0000 mg | Freq: Once | INTRAMUSCULAR | Status: AC
Start: 2023-02-21 — End: 2023-02-21
  Administered 2023-02-21: 80 mg via INTRAMUSCULAR

## 2023-02-21 NOTE — Progress Notes (Signed)
Subjective:    Patient ID: Matthew Pennington, male    DOB: 10-13-1965, 57 y.o.   MRN: [id number]   Chief Complaint: Back Pain (Low back right side/)   Patient went to see Chiropractor and developed back pain after seeing them.  Back Pain This is a new problem. The current episode started in the past 7 days. The problem occurs intermittently. The problem has been waxing and waning since onset. The pain is present in the lumbar spine. The quality of the pain is described as stabbing. The pain does not radiate. The pain is at a severity of 5/10. The pain is moderate. The pain is Worse during the day. The symptoms are aggravated by bending and twisting. Pertinent negatives include no abdominal pain, chest pain, headaches, numbness, paresis, paresthesias, perianal numbness, tingling or weakness. He has tried NSAIDs (tENS unit) for the symptoms. The treatment provided mild relief.    Patient Active Problem List   Diagnosis Date Noted   Acute pain of left knee 01/02/2023   Pain of left hip 01/02/2023   Fibromyalgia 07/26/2022   Calcific tendonitis 07/26/2022   Polyarthralgia 06/22/2022   Ureteral calculus, right 04/27/2022   Nephrolithiasis 04/27/2022   Osteoarthritis of carpometacarpal (CMC) joint of thumb 04/12/2022   Statin myopathy 03/03/2022   Right lateral epicondylitis 02/11/2022   Left lateral epicondylitis 02/11/2022   Arthritis of carpometacarpal (CMC) joint of right thumb 06/08/2021   Hepatic steatosis 12/24/2019   Gastroesophageal reflux disease 08/23/2019   Controlled substance agreement signed 08/23/2019   Moderate recurrent major depression (HCC) 08/23/2019   OSA (obstructive sleep apnea) 12/05/2017   GAD (generalized anxiety disorder) 11/24/2017   Leg cramps, sleep related 11/24/2017   Hypertension associated with diabetes (HCC) 03/29/2017   Diabetes mellitus (HCC) 03/29/2017   Hyperlipidemia associated with type 2 diabetes mellitus (HCC) 03/29/2017   Diverticulosis of  large intestine without hemorrhage 03/29/2017   History of prostatitis 03/29/2017   Light headedness    Tingling    Weakness    Trigeminal neuralgia        Review of Systems  Constitutional:  Negative for diaphoresis.  Eyes:  Negative for pain.  Respiratory:  Negative for shortness of breath.   Cardiovascular:  Negative for chest pain, palpitations and leg swelling.  Gastrointestinal:  Negative for abdominal pain.  Endocrine: Negative for polydipsia.  Musculoskeletal:  Positive for back pain.  Skin:  Negative for rash.  Neurological:  Negative for dizziness, tingling, weakness, numbness, headaches and paresthesias.  Hematological:  Does not bruise/bleed easily.  All other systems reviewed and are negative.      Objective:   Physical Exam Constitutional:      Appearance: Normal appearance. He is obese.  Cardiovascular:     Rate and Rhythm: Normal rate and regular rhythm.     Heart sounds: Normal heart sounds.  Pulmonary:     Effort: Pulmonary effort is normal.     Breath sounds: Normal breath sounds.  Musculoskeletal:     Comments: Decrease ROM of lumbar spine with pain on twisting. (-) SLR bil Motor strength and sensation distally intact  Skin:    General: Skin is warm.  Neurological:     General: No focal deficit present.     Mental Status: He is alert and oriented to person, place, and time.  Psychiatric:        Mood and Affect: Mood normal.        Behavior: Behavior normal.     BP 116/78  Pulse 71   Temp 97.7 F (36.5 C) (Temporal)   Resp 20   Ht 6\' 1"  (1.854 m)   Wt 289 lb (131.1 kg)   SpO2 95%   BMI 38.13 kg/m        Assessment & Plan:   Doye Geraci in today with chief complaint of Back Pain (Low back right side/)   1. Acute right-sided low back pain without sciatica Moist heat Rest Back stretches - Urinalysis, Complete - methylPREDNISolone acetate (DEPO-MEDROL) injection 80 mg - ketorolac (TORADOL) injection 60 mg    The above  assessment and management plan was discussed with the patient. The patient verbalized understanding of and has agreed to the management plan. Patient is aware to call the clinic if symptoms persist or worsen. Patient is aware when to return to the clinic for a follow-up visit. Patient educated on when it is appropriate to go to the emergency department.   Mary-Margaret Daphine Deutscher, FNP

## 2023-02-21 NOTE — Patient Instructions (Signed)

## 2023-02-23 ENCOUNTER — Telehealth: Payer: Self-pay | Admitting: Family

## 2023-02-23 DIAGNOSIS — G4733 Obstructive sleep apnea (adult) (pediatric): Secondary | ICD-10-CM

## 2023-02-23 DIAGNOSIS — Z79899 Other long term (current) drug therapy: Secondary | ICD-10-CM | POA: Diagnosis not present

## 2023-02-23 DIAGNOSIS — Z885 Allergy status to narcotic agent status: Secondary | ICD-10-CM | POA: Diagnosis not present

## 2023-02-23 DIAGNOSIS — M5136 Other intervertebral disc degeneration, lumbar region: Secondary | ICD-10-CM | POA: Diagnosis not present

## 2023-02-23 DIAGNOSIS — Z5321 Procedure and treatment not carried out due to patient leaving prior to being seen by health care provider: Secondary | ICD-10-CM | POA: Diagnosis not present

## 2023-02-23 DIAGNOSIS — M545 Low back pain, unspecified: Secondary | ICD-10-CM | POA: Diagnosis not present

## 2023-02-23 DIAGNOSIS — G8929 Other chronic pain: Secondary | ICD-10-CM | POA: Diagnosis not present

## 2023-02-23 DIAGNOSIS — Z7984 Long term (current) use of oral hypoglycemic drugs: Secondary | ICD-10-CM | POA: Diagnosis not present

## 2023-02-23 NOTE — Telephone Encounter (Signed)
Please fax order

## 2023-02-23 NOTE — Telephone Encounter (Signed)
Patient aware and verbalized understanding.

## 2023-02-28 ENCOUNTER — Ambulatory Visit (INDEPENDENT_AMBULATORY_CARE_PROVIDER_SITE_OTHER): Payer: Self-pay | Admitting: Neurology

## 2023-02-28 ENCOUNTER — Encounter: Payer: Self-pay | Admitting: Neurology

## 2023-02-28 VITALS — BP 139/78 | HR 82 | Ht 73.0 in | Wt 290.0 lb

## 2023-02-28 DIAGNOSIS — Z5321 Procedure and treatment not carried out due to patient leaving prior to being seen by health care provider: Secondary | ICD-10-CM

## 2023-02-28 NOTE — Progress Notes (Signed)
Pt decided to leave before being seen.

## 2023-03-10 DIAGNOSIS — Z1212 Encounter for screening for malignant neoplasm of rectum: Secondary | ICD-10-CM | POA: Diagnosis not present

## 2023-03-15 LAB — COLOGUARD: COLOGUARD: NEGATIVE

## 2023-03-16 ENCOUNTER — Ambulatory Visit: Payer: Medicare Other

## 2023-03-16 VITALS — Ht 73.0 in | Wt 285.0 lb

## 2023-03-16 DIAGNOSIS — Z0000 Encounter for general adult medical examination without abnormal findings: Secondary | ICD-10-CM | POA: Diagnosis not present

## 2023-03-16 NOTE — Patient Instructions (Signed)
Matthew Pennington , Thank you for taking time to come for your Medicare Wellness Visit. I appreciate your ongoing commitment to your health goals. Please review the following plan we discussed and let me know if I can assist you in the future.   Referrals/Orders/Follow-Ups/Clinician Recommendations: Aim for 30 minutes of exercise or brisk walking, 6-8 glasses of water, and 5 servings of fruits and vegetables each day.   This is a list of the screening recommended for you and due dates:  Health Maintenance  Topic Date Due   COVID-19 Vaccine (1) Never done   DTaP/Tdap/Td vaccine (1 - Tdap) Never done   Zoster (Shingles) Vaccine (1 of 2) Never done   Complete foot exam   08/27/2021   Flu Shot  08/28/2023*   Hemoglobin A1C  05/05/2023   Eye exam for diabetics  07/01/2023   Yearly kidney function blood test for diabetes  11/03/2023   Yearly kidney health urinalysis for diabetes  11/03/2023   Medicare Annual Wellness Visit  03/15/2024   Cologuard (Stool DNA test)  03/09/2026   Colon Cancer Screening  01/28/2030   Hepatitis C Screening  Completed   HIV Screening  Completed   HPV Vaccine  Aged Out  *Topic was postponed. The date shown is not the original due date.    Advanced directives: (Provided) Advance directive discussed with you today. I have provided a copy for you to complete at home and have notarized. Once this is complete, please bring a copy in to our office so we can scan it into your chart. Information on Advanced Care Planning can be found at [facility] Advance Health Care Directives Advance Health Care Directives ([email or web address]    Next Medicare Annual Wellness Visit scheduled for next year: Yes Insert preventive care Attachment reference

## 2023-03-16 NOTE — Progress Notes (Signed)
Subjective:   Matthew Pennington is a 57 y.o. male who presents for Medicare Annual/Subsequent preventive examination.  Visit Complete: Virtual I connected with  Lisa Roca on 03/16/23 by a audio enabled telemedicine application and verified that I am speaking with the correct person using two identifiers.  Patient Location: Home  Provider Location: Home Office  I discussed the limitations of evaluation and management by telemedicine. The patient expressed understanding and agreed to proceed.  Vital Signs: Because this visit was a virtual/telehealth visit, some criteria may be missing or patient reported. Any vitals not documented were not able to be obtained and vitals that have been documented are patient reported.  Patient Medicare AWV questionnaire was completed by the patient on 03/16/2023; I have confirmed that all information answered by patient is correct and no changes since this date.  Cardiac Risk Factors include: advanced age (>64men, >16 women);diabetes mellitus;dyslipidemia;male gender;hypertension     Objective:    Today's Vitals   03/16/23 1429  Weight: 285 lb (129.3 kg)  Height: 6\' 1"  (1.854 m)   Body mass index is 37.6 kg/m.     03/16/2023    2:33 PM 04/20/2022    8:56 AM 03/15/2022    2:18 PM 02/03/2021    4:23 PM 07/07/2018    8:28 AM  Advanced Directives  Does Patient Have a Medical Advance Directive? No No No No No  Would patient like information on creating a medical advance directive? Yes (MAU/Ambulatory/Procedural Areas - Information given) No - Patient declined No - Patient declined No - Patient declined No - Patient declined    Current Medications (verified) Outpatient Encounter Medications as of 03/16/2023  Medication Sig   Blood Glucose Calibration (ONETOUCH VERIO) High SOLN    Blood Glucose Monitoring Suppl (ONETOUCH VERIO REFLECT) w/Device KIT    desvenlafaxine (PRISTIQ) 50 MG 24 hr tablet Take 1 tablet (50 mg total) by mouth daily.    diclofenac Sodium (VOLTAREN) 1 % GEL Apply 4 g topically 4 (four) times daily.   enalapril (VASOTEC) 20 MG tablet Take 1 tablet (20 mg total) by mouth daily.   ezetimibe (ZETIA) 10 MG tablet Take 1 tablet (10 mg total) by mouth daily.   gabapentin (NEURONTIN) 300 MG capsule Take 1 capsule (300 mg total) by mouth 3 (three) times daily.   glucose blood (ONETOUCH VERIO) test strip    ibuprofen (ADVIL) 800 MG tablet Take 1 tablet (800 mg total) by mouth daily.   Lancet Devices (SIMPLE DIAGNOSTICS LANCING DEV) MISC Apply topically.   metFORMIN (GLUCOPHAGE) 500 MG tablet TAKE 2 TABLETS BY MOUTH TWICE DAILY WITH A MEAL   oxcarbazepine (TRILEPTAL) 600 MG tablet Take 1 tablet (600 mg total) by mouth 3 (three) times daily.   oxyCODONE-acetaminophen (PERCOCET/ROXICET) 5-325 MG tablet Take 1 tablet by mouth every 6 (six) hours as needed for severe pain.   pantoprazole (PROTONIX) 40 MG tablet Take 1 tablet (40 mg total) by mouth daily.   rOPINIRole (REQUIP) 0.25 MG tablet TAKE 1 TO 2 TABLETS BY MOUTH AT BEDTIME FOR  RESTLESS  LEGS   Semaglutide,0.25 or 0.5MG /DOS, 2 MG/3ML SOPN Inject 0.5 mg into the skin once a week.   traMADol (ULTRAM) 50 MG tablet Take 1 tablet (50 mg total) by mouth daily.   No facility-administered encounter medications on file as of 03/16/2023.    Allergies (verified) Morphine and codeine and Statins   History: Past Medical History:  Diagnosis Date   Diabetes mellitus without complication (HCC)    H/O echocardiogram  09/2017   normal EF   Headache    Hyperlipidemia    Hypertension    Light headedness    OSA on CPAP    Palpitations    Tingling    toes   Trigeminal neuralgia    Past Surgical History:  Procedure Laterality Date   NO PAST SURGERIES     Family History  Problem Relation Age of Onset   Diabetes Mother    Hyperlipidemia Mother    Hypertension Mother    Cancer Father    Lung cancer Father    Heart disease Sister    Social History   Socioeconomic  History   Marital status: Married    Spouse name: Lambert Mody   Number of children: 3   Years of education: Not on file   Highest education level: Bachelor's degree (e.g., BA, AB, BS)  Occupational History   Not on file  Tobacco Use   Smoking status: Never    Passive exposure: Past   Smokeless tobacco: Never  Vaping Use   Vaping status: Never Used  Substance and Sexual Activity   Alcohol use: No   Drug use: No   Sexual activity: Yes    Comment: getting limitted due to his pain  Other Topics Concern   Not on file  Social History Narrative   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   Social Determinants of Health   Financial Resource Strain: Low Risk  (03/16/2023)   Overall Financial Resource Strain (CARDIA)    Difficulty of Paying Living Expenses: Not hard at all  Food Insecurity: No Food Insecurity (03/16/2023)   Hunger Vital Sign    Worried About Running Out of Food in the Last Year: Never true    Ran Out of Food in the Last Year: Never true  Transportation Needs: No Transportation Needs (03/16/2023)   PRAPARE - Administrator, Civil Service (Medical): No    Lack of Transportation (Non-Medical): No  Physical Activity: Inactive (03/16/2023)   Exercise Vital Sign    Days of Exercise per Week: 0 days    Minutes of Exercise per Session: 0 min  Stress: No Stress Concern Present (03/16/2023)   [employer] of Occupational Health - Occupational Stress Questionnaire    Feeling of Stress : Not at all  Recent Concern: Stress - Stress Concern Present (01/02/2023)   [employer] of Occupational Health - Occupational Stress Questionnaire    Feeling of Stress : Very much  Social Connections: Moderately Integrated (03/16/2023)   Social Connection and Isolation Panel [NHANES]    Frequency of Communication with Friends and Family: More than three times a week    Frequency of Social Gatherings with Friends and  Family: More than three times a week    Attends Religious Services: 1 to 4 times per year    Active Member of [employer] or Organizations: No    Attends Engineer, structural: Never    Marital Status: Married    Tobacco Counseling Counseling given: Not Answered   Clinical Intake:  Pre-visit preparation completed: Yes  Pain : No/denies pain     Nutritional Risks: None Diabetes: Yes CBG done?: No Did pt. bring in CBG monitor from home?: No  How often do you need to have someone help you when you read instructions, pamphlets, or other written materials from your doctor or pharmacy?: 1 - Never  Interpreter Needed?: No  Information entered by :: Renie Ora, LPN   Activities of Daily Living    03/16/2023    2:33 PM  In your present state of health, do you have any difficulty performing the following activities:  Hearing? 0  Vision? 0  Difficulty concentrating or making decisions? 0  Walking or climbing stairs? 0  Dressing or bathing? 0  Doing errands, shopping? 0  Preparing Food and eating ? N  Using the Toilet? N  In the past six months, have you accidently leaked urine? N  Do you have problems with loss of bowel control? N  Managing your Medications? N  Managing your Finances? N  Housekeeping or managing your Housekeeping? N    Patient Care Team: Junie Spencer, FNP as PCP - General (Family Medicine) Laqueta Linden, MD (Inactive) as PCP - Cardiology (Cardiology) Lanelle Bal, DO as Consulting Physician (Internal Medicine)  Indicate any recent Medical Services you may have received from other than [facility] providers in the past year (date may be approximate).     Assessment:   This is a routine wellness examination for Xavyer.  Hearing/Vision screen Vision Screening - Comments:: Wears rx glasses - up to date with routine eye exams with  [facility] care    Goals Addressed             This Visit's Progress    DIET - EAT MORE FRUITS AND  VEGETABLES         Depression Screen    03/16/2023    2:32 PM 02/21/2023   11:45 AM 01/02/2023   12:03 PM 11/03/2022   10:14 AM 08/11/2022    2:23 PM 06/10/2022   11:21 AM 03/15/2022    2:13 PM  PHQ 2/9 Scores  PHQ - 2 Score 0 4 4 6 3 4 4   PHQ- 9 Score 0 17 14 22 14 11 12     Fall Risk    03/16/2023    2:30 PM 02/21/2023   11:45 AM 01/02/2023   12:04 PM 08/11/2022    2:23 PM 06/10/2022   11:20 AM  Fall Risk   Falls in the past year? 0 0 0 0 0  Number falls in past yr: 0  0 0   Injury with Fall? 0  0 0   Risk for fall due to : No Fall Risks  No Fall Risks No Fall Risks   Follow up Falls prevention discussed  Falls evaluation completed Falls evaluation completed     MEDICARE RISK AT HOME: Medicare Risk at Home Any stairs in or around the home?: Yes If so, are there any without handrails?: No Home free of loose throw rugs in walkways, pet beds, electrical cords, etc?: Yes Adequate lighting in your home to reduce risk of falls?: Yes Life alert?: No Use of a cane, walker or w/c?: No Grab bars in the bathroom?: Yes Shower chair or bench in shower?: Yes Elevated toilet seat or a handicapped toilet?: Yes  TIMED UP AND GO:  Was the test performed?  No    Cognitive Function:        03/16/2023    2:33 PM 03/15/2022    2:23 PM 02/03/2021    4:25 PM  6CIT Screen  What Year? 0 points 0 points 0 points  What month? 0 points 0 points 0 points  What time? 0 points 3 points 0 points  Count back from 20 0 points 0 points 0 points  Months in reverse 0 points 4  points 0 points  Repeat phrase 0 points 0 points 0 points  Total Score 0 points 7 points 0 points    Immunizations Immunization History  Administered Date(s) Administered   Influenza Split 02/13/2017   Influenza,inj,Quad PF,6+ Mos 02/21/2022   Influenza,inj,quad, With Preservative 02/13/2017, 02/20/2018, 02/05/2019   Influenza-Unspecified 01/29/2020, 03/30/2021    TDAP status: Due, Education has been provided regarding  the importance of this vaccine. Advised may receive this vaccine at local pharmacy or Health Dept. Aware to provide a copy of the vaccination record if obtained from local pharmacy or Health Dept. Verbalized acceptance and understanding.  Flu Vaccine status: Due, Education has been provided regarding the importance of this vaccine. Advised may receive this vaccine at local pharmacy or Health Dept. Aware to provide a copy of the vaccination record if obtained from local pharmacy or Health Dept. Verbalized acceptance and understanding.  Pneumococcal vaccine status: Due, Education has been provided regarding the importance of this vaccine. Advised may receive this vaccine at local pharmacy or Health Dept. Aware to provide a copy of the vaccination record if obtained from local pharmacy or Health Dept. Verbalized acceptance and understanding.  Covid-19 vaccine status: Declined, Education has been provided regarding the importance of this vaccine but patient still declined. Advised may receive this vaccine at local pharmacy or Health Dept.or vaccine clinic. Aware to provide a copy of the vaccination record if obtained from local pharmacy or Health Dept. Verbalized acceptance and understanding.  Qualifies for Shingles Vaccine? Yes   Zostavax completed No   Shingrix Completed?: No.    Education has been provided regarding the importance of this vaccine. Patient has been advised to call insurance company to determine out of pocket expense if they have not yet received this vaccine. Advised may also receive vaccine at local pharmacy or Health Dept. Verbalized acceptance and understanding.  Screening Tests Health Maintenance  Topic Date Due   COVID-19 Vaccine (1) Never done   DTaP/Tdap/Td (1 - Tdap) Never done   Zoster Vaccines- Shingrix (1 of 2) Never done   FOOT EXAM  08/27/2021   INFLUENZA VACCINE  08/28/2023 (Originally 12/29/2022)   HEMOGLOBIN A1C  05/05/2023   OPHTHALMOLOGY EXAM  07/01/2023    Diabetic kidney evaluation - eGFR measurement  11/03/2023   Diabetic kidney evaluation - Urine ACR  11/03/2023   Medicare Annual Wellness (AWV)  03/15/2024   Fecal DNA (Cologuard)  03/09/2026   Colonoscopy  01/28/2030   Hepatitis C Screening  Completed   HIV Screening  Completed   HPV VACCINES  Aged Out    Health Maintenance  Health Maintenance Due  Topic Date Due   COVID-19 Vaccine (1) Never done   DTaP/Tdap/Td (1 - Tdap) Never done   Zoster Vaccines- Shingrix (1 of 2) Never done   FOOT EXAM  08/27/2021    Colorectal cancer screening: Type of screening: Cologuard. Completed 03/10/2023. Repeat every 3 years  Lung Cancer Screening: (Low Dose CT Chest recommended if Age 76-80 years, 20 pack-year currently smoking OR have quit w/in 15years.) does not qualify.   Lung Cancer Screening Referral: n/a  Additional Screening:  Hepatitis C Screening: does not qualify; Completed 04/09/2020  Vision Screening: Recommended annual ophthalmology exams for early detection of glaucoma and other disorders of the eye. Is the patient up to date with their annual eye exam?  Yes  Who is the provider or what is the name of the office in which the patient attends annual eye exams? [facility]  If pt  is not established with a provider, would they like to be referred to a provider to establish care? No .   Dental Screening: Recommended annual dental exams for proper oral hygiene  Diabetic Foot Exam: Diabetic Foot Exam: Overdue, Pt has been advised about the importance in completing this exam. Pt is scheduled for diabetic foot exam on next office visit .  Community Resource Referral / Chronic Care Management: CRR required this visit?  No   CCM required this visit?  No     Plan:     I have personally reviewed and noted the following in the patient's chart:   Medical and social history Use of alcohol, tobacco or illicit drugs  Current medications and supplements including opioid  prescriptions. Patient is currently taking opioid prescriptions. Information provided to patient regarding non-opioid alternatives. Patient advised to discuss non-opioid treatment plan with their provider. Functional ability and status Nutritional status Physical activity Advanced directives List of other physicians Hospitalizations, surgeries, and ER visits in previous 12 months Vitals Screenings to include cognitive, depression, and falls Referrals and appointments  In addition, I have reviewed and discussed with patient certain preventive protocols, quality metrics, and best practice recommendations. A written personalized care plan for preventive services as well as general preventive health recommendations were provided to patient.     Lorrene Reid, LPN   [id number]   After Visit Summary: (MyChart) Due to this being a telephonic visit, the after visit summary with patients personalized plan was offered to patient via MyChart   Nurse Notes: none

## 2023-03-30 ENCOUNTER — Encounter: Payer: Self-pay | Admitting: Family

## 2023-03-30 ENCOUNTER — Ambulatory Visit: Payer: Medicare Other | Admitting: Family

## 2023-03-30 VITALS — BP 139/74 | HR 102 | Temp 99.6°F | Ht 73.0 in | Wt 289.2 lb

## 2023-03-30 DIAGNOSIS — M545 Low back pain, unspecified: Secondary | ICD-10-CM | POA: Diagnosis not present

## 2023-03-30 DIAGNOSIS — E1169 Type 2 diabetes mellitus with other specified complication: Secondary | ICD-10-CM | POA: Diagnosis not present

## 2023-03-30 MED ORDER — KETOROLAC TROMETHAMINE 60 MG/2ML IM SOLN
60.0000 mg | Freq: Once | INTRAMUSCULAR | Status: AC
Start: 2023-03-30 — End: 2023-03-30
  Administered 2023-03-30: 60 mg via INTRAMUSCULAR

## 2023-03-30 MED ORDER — METHYLPREDNISOLONE ACETATE 40 MG/ML IJ SUSP
80.0000 mg | Freq: Once | INTRAMUSCULAR | Status: AC
  Administered 2023-03-30: 80 mg via INTRAMUSCULAR

## 2023-03-30 MED ORDER — METHYLPREDNISOLONE ACETATE 80 MG/ML IJ SUSP
80.0000 mg | Freq: Once | INTRAMUSCULAR | Status: DC
Start: 2023-03-30 — End: 2023-03-30

## 2023-03-30 NOTE — Addendum Note (Signed)
Addended by: Adella Hare B on: 03/30/2023 03:46 PM   Modules accepted: Orders

## 2023-03-30 NOTE — Patient Instructions (Addendum)
Acute Back Pain, Adult Acute back pain is sudden and usually short-lived. It is often caused by an injury to the muscles and tissues in the back. The injury may result from: A muscle, tendon, or ligament getting overstretched or torn. Ligaments are tissues that connect bones to each other. Lifting something improperly can cause a back strain. Wear and tear (degeneration) of the spinal disks. Spinal disks are circular tissue that provide cushioning between the bones of the spine (vertebrae). Twisting motions, such as while playing sports or doing yard work. A hit to the back. Arthritis. You may have a physical exam, lab tests, and imaging tests to find the cause of your pain. Acute back pain usually goes away with rest and home care. Follow these instructions at home: Managing pain, stiffness, and swelling Take over-the-counter and prescription medicines only as told by your health care provider. Treatment may include medicines for pain and inflammation that are taken by mouth or applied to the skin, or muscle relaxants. Your health care provider may recommend applying ice during the first 24-48 hours after your pain starts. To do this: Put ice in a plastic bag. Place a towel between your skin and the bag. Leave the ice on for 20 minutes, 2-3 times a day. Remove the ice if your skin turns bright red. This is very important. If you cannot feel pain, heat, or cold, you have a greater risk of damage to the area. If directed, apply heat to the affected area as often as told by your health care provider. Use the heat source that your health care provider recommends, such as a moist heat pack or a heating pad. Place a towel between your skin and the heat source. Leave the heat on for 20-30 minutes. Remove the heat if your skin turns bright red. This is especially important if you are unable to feel pain, heat, or cold. You have a greater risk of getting burned. Activity  Do not stay in bed. Staying in  bed for more than 1-2 days can delay your recovery. Sit up and stand up straight. Avoid leaning forward when you sit or hunching over when you stand. If you work at a desk, sit close to it so you do not need to lean over. Keep your chin tucked in. Keep your neck drawn back, and keep your elbows bent at a 90-degree angle (right angle). Sit high and close to the steering wheel when you drive. Add lower back (lumbar) support to your car seat, if needed. Take short walks on even surfaces as soon as you are able. Try to increase the length of time you walk each day. Do not sit, drive, or stand in one place for more than 30 minutes at a time. Sitting or standing for long periods of time can put stress on your back. Do not drive or use heavy machinery while taking prescription pain medicine. Use proper lifting techniques. When you bend and lift, use positions that put less stress on your back: Bend your knees. Keep the load close to your body. Avoid twisting. Exercise regularly as told by your health care provider. Exercising helps your back heal faster and helps prevent back injuries by keeping muscles strong and flexible. Work with a physical therapist to make a safe exercise program, as recommended by your health care provider. Do any exercises as told by your physical therapist. Lifestyle Maintain a healthy weight. Extra weight puts stress on your back and makes it difficult to have good   posture. Avoid activities or situations that make you feel anxious or stressed. Stress and anxiety increase muscle tension and can make back pain worse. Learn ways to manage anxiety and stress, such as through exercise. General instructions Sleep on a firm mattress in a comfortable position. Try lying on your side with your knees slightly bent. If you lie on your back, put a pillow under your knees. Keep your head and neck in a straight line with your spine (neutral position) when using electronic equipment like  smartphones or pads. To do this: Raise your smartphone or pad to look at it instead of bending your head or neck to look down. Put the smartphone or pad at the level of your face while looking at the screen. Follow your treatment plan as told by your health care provider. This may include: Cognitive or behavioral therapy. Acupuncture or massage therapy. Meditation or yoga. Contact a health care provider if: You have pain that is not relieved with rest or medicine. You have increasing pain going down into your legs or buttocks. Your pain does not improve after 2 weeks. You have pain at night. You lose weight without trying. You have a fever or chills. You develop nausea or vomiting. You develop abdominal pain. Get help right away if: You develop new bowel or bladder control problems. You have unusual weakness or numbness in your arms or legs. You feel faint. These symptoms may represent a serious problem that is an emergency. Do not wait to see if the symptoms will go away. Get medical help right away. Call your local emergency services (911 in the [place] Do not drive yourself to the hospital. Summary Acute back pain is sudden and usually short-lived. Use proper lifting techniques. When you bend and lift, use positions that put less stress on your back. Take over-the-counter and prescription medicines only as told by your health care provider, and apply heat or ice as told. This information is not intended to replace advice given to you by your health care provider. Make sure you discuss any questions you have with your health care provider. Document Revised: 08/07/2020 Document Reviewed: 08/07/2020 Elsevier Patient Education  2024 [employer]  

## 2023-03-30 NOTE — Progress Notes (Signed)
Subjective:    Patient ID: Matthew Pennington, male    DOB: [birth year], 57 y.o.   MRN: [id number]  Chief Complaint  Patient presents with   Back Pain    lower    Pt presents to the office today with lower back pain that started two weeks ago. PT reports he is in the process of moving and has been packing. He has tried diclofenac gel, gabapentin, ibuprofen 800 mg TID prn, and Ultram with little relief.  Back Pain This is a new problem. The current episode started 1 to 4 weeks ago. The problem occurs intermittently. The problem has been waxing and waning since onset. The pain is present in the lumbar spine. The quality of the pain is described as aching. The pain is at a severity of 10/10. The pain is moderate. The symptoms are aggravated by standing and sitting. Risk factors include obesity. He has tried bed rest, analgesics, home exercises and NSAIDs for the symptoms. The treatment provided mild relief.  Diabetes He presents for his follow-up diabetic visit. He has type 2 diabetes mellitus. Risk factors for coronary artery disease include dyslipidemia, diabetes mellitus, hypertension and sedentary lifestyle. His overall blood glucose range is 140-180 mg/dl.      Review of Systems  Musculoskeletal:  Positive for back pain.  All other systems reviewed and are negative.      Objective:   Physical Exam Vitals reviewed.  Constitutional:      General: He is not in acute distress.    Appearance: He is well-developed. He is obese.  HENT:     Head: Normocephalic.  Eyes:     General:        Right eye: No discharge.        Left eye: No discharge.     Pupils: Pupils are equal, round, and reactive to light.  Neck:     Thyroid: No thyromegaly.  Cardiovascular:     Rate and Rhythm: Normal rate and regular rhythm.     Heart sounds: Normal heart sounds. No murmur heard. Pulmonary:     Effort: Pulmonary effort is normal. No respiratory distress.     Breath sounds: Normal breath sounds. No  wheezing.  Abdominal:     General: Bowel sounds are normal. There is no distension.     Palpations: Abdomen is soft.     Tenderness: There is no abdominal tenderness.  Musculoskeletal:        General: No tenderness. Normal range of motion.     Cervical back: Normal range of motion and neck supple.     Comments: Pain in lumbar with flexion and extension  Skin:    General: Skin is warm and dry.     Findings: No erythema or rash.  Neurological:     Mental Status: He is alert and oriented to person, place, and time.     Cranial Nerves: No cranial nerve deficit.     Motor: Weakness present.     Gait: Gait abnormal (using cane to walk).     Deep Tendon Reflexes: Reflexes are normal and symmetric.  Psychiatric:        Behavior: Behavior normal.        Thought Content: Thought content normal.        Judgment: Judgment normal.    BP 139/74   Pulse (!) 102   Temp 99.6 F (37.6 C)   Ht 6\' 1"  (1.854 m)   Wt 289 lb 3.2 oz (131.2 kg)   SpO2  94%   BMI 38.16 kg/m       Assessment & Plan:  Matthew Pennington comes in today with chief complaint of Back Pain (lower)   Diagnosis and orders addressed:  1. Type 2 diabetes mellitus with other specified complication, without long-term current use of insulin (HCC) -Low carb diet    2. Acute right-sided low back pain without sciatica Rest Ice  Continue ibuprofen, diclofenac gel, gabapentin,  and Ultram  - Follow up if symptoms worsen or do not improve  - methylPREDNISolone acetate (DEPO-MEDROL) injection 80 mg - ketorolac (TORADOL) injection 60 mg      [name], FNP

## 2023-03-31 DIAGNOSIS — M549 Dorsalgia, unspecified: Secondary | ICD-10-CM | POA: Diagnosis not present

## 2023-04-04 ENCOUNTER — Emergency Department (HOSPITAL_COMMUNITY)
Admission: EM | Admit: 2023-04-04 | Discharge: 2023-04-04 | Disposition: A | Discharge status: Home / Self Care | Payer: Medicare Other | Attending: Student | Admitting: Student

## 2023-04-04 DIAGNOSIS — S39012A Strain of muscle, fascia and tendon of lower back, initial encounter: Secondary | ICD-10-CM

## 2023-04-04 DIAGNOSIS — Z7984 Long term (current) use of oral hypoglycemic drugs: Secondary | ICD-10-CM | POA: Diagnosis not present

## 2023-04-04 DIAGNOSIS — M545 Low back pain, unspecified: Secondary | ICD-10-CM | POA: Insufficient documentation

## 2023-04-04 DIAGNOSIS — E119 Type 2 diabetes mellitus without complications: Secondary | ICD-10-CM | POA: Diagnosis not present

## 2023-04-04 DIAGNOSIS — I10 Essential (primary) hypertension: Secondary | ICD-10-CM | POA: Insufficient documentation

## 2023-04-04 MED ORDER — LIDOCAINE 5 % EX PTCH: 1.0000 | MEDICATED_PATCH | CUTANEOUS | 0 refills | Status: AC

## 2023-04-04 MED ORDER — NAPROXEN 375 MG PO TABS: 375.0000 mg | ORAL_TABLET | Freq: Two times a day (BID) | ORAL | 0 refills | Status: DC

## 2023-04-04 MED ORDER — KETOROLAC TROMETHAMINE 15 MG/ML IJ SOLN
15.0000 mg | Freq: Once | INTRAMUSCULAR | Status: AC
  Administered 2023-04-04: 15 mg via INTRAMUSCULAR
  Filled 2023-04-04: qty 1

## 2023-04-04 MED ORDER — LIDOCAINE 5 % EX PTCH
1.0000 | MEDICATED_PATCH | CUTANEOUS | Status: DC
  Administered 2023-04-04: 1 via TRANSDERMAL
  Filled 2023-04-04: qty 1

## 2023-04-04 MED ORDER — ACETAMINOPHEN 500 MG PO TABS
1000.0000 mg | ORAL_TABLET | Freq: Three times a day (TID) | ORAL | 0 refills | Status: AC
Start: 2023-04-04 — End: 2023-05-04

## 2023-04-04 NOTE — Discharge Instructions (Signed)
For pain:  - Acetaminophen 1000 mg three times daily (every 8 hours) - Naproxen 2 times daily (every 12 hours) - lidoderm patches twice daily 

## 2023-04-04 NOTE — ED Triage Notes (Signed)
Pt c/o R lower back pain, rates pain 10/10 throbbing. States it feels "a bit better when I rest"

## 2023-04-04 NOTE — ED Provider Notes (Signed)
[place] EMERGENCY DEPARTMENT AT [facility] Provider Note  CSN: [id number] Arrival date & time: 04/04/23 0830  Chief Complaint(s) Back Pain  HPI Matthew Pennington is a 57 y.o. male with PMH T2DM, HTN, HLD, trigeminal neuralgia, lumbar arthritis who presents emergency room for evaluation of right-sided back pain.  States that he went to a chiropractor approximately 1 month ago and is worried that he had an issue with the lumbar manipulation.  He also has been moving houses and has had an episode of heavy lifting recently that significantly exacerbated his back pain.  No associated numbness, tingling, weakness of lower extremities.  No associated urinary retention, loss of bowel or bladder or saddle anesthesia.   Past Medical History Past Medical History:  Diagnosis Date   Diabetes mellitus without complication (HCC)    H/O echocardiogram 09/2017   normal EF   Headache    Hyperlipidemia    Hypertension    Light headedness    OSA on CPAP    Palpitations    Tingling    toes   Trigeminal neuralgia    Patient Active Problem List   Diagnosis Date Noted   Pain of left hip 01/02/2023   Fibromyalgia 07/26/2022   Calcific tendonitis 07/26/2022   Polyarthralgia 06/22/2022   Ureteral calculus, right 04/27/2022   Nephrolithiasis 04/27/2022   Osteoarthritis of carpometacarpal (CMC) joint of thumb 04/12/2022   Statin myopathy 03/03/2022   Right lateral epicondylitis 02/11/2022   Left lateral epicondylitis 02/11/2022   Arthritis of carpometacarpal (CMC) joint of right thumb 06/08/2021   Hepatic steatosis 12/24/2019   Gastroesophageal reflux disease 08/23/2019   Controlled substance agreement signed 08/23/2019   Moderate recurrent major depression (HCC) 08/23/2019   OSA (obstructive sleep apnea) 12/05/2017   GAD (generalized anxiety disorder) 11/24/2017   Leg cramps, sleep related 11/24/2017   Hypertension associated with diabetes (HCC) 03/29/2017   Diabetes mellitus (HCC)  03/29/2017   Hyperlipidemia associated with type 2 diabetes mellitus (HCC) 03/29/2017   Diverticulosis of large intestine without hemorrhage 03/29/2017   History of prostatitis 03/29/2017   Light headedness    Tingling    Weakness    Trigeminal neuralgia    Home Medication(s) Prior to Admission medications   Medication Sig Start Date End Date Taking? Authorizing Provider  Blood Glucose Calibration (ONETOUCH VERIO) High SOLN  02/27/20   [provider]  Blood Glucose Monitoring Suppl (ONETOUCH VERIO REFLECT) w/Device KIT  02/27/20   [provider]  desvenlafaxine (PRISTIQ) 50 MG 24 hr tablet Take 1 tablet (50 mg total) by mouth daily. 11/03/22   [name] A, FNP  diclofenac Sodium (VOLTAREN) 1 % GEL Apply 4 g topically 4 (four) times daily. 01/02/23   [place], NP  enalapril (VASOTEC) 20 MG tablet Take 1 tablet (20 mg total) by mouth daily. 11/03/22   Junie Spencer, FNP  ezetimibe (ZETIA) 10 MG tablet Take 1 tablet (10 mg total) by mouth daily. 11/03/22   Junie Spencer, FNP  gabapentin (NEURONTIN) 300 MG capsule Take 1 capsule (300 mg total) by mouth 3 (three) times daily. 11/03/22   [name] A, FNP  glucose blood (ONETOUCH VERIO) test strip  02/27/20   [provider]  ibuprofen (ADVIL) 800 MG tablet Take 1 tablet (800 mg total) by mouth daily. 11/03/22   Junie Spencer, FNP  Lancet Devices (SIMPLE DIAGNOSTICS LANCING DEV) MISC Apply topically. 07/26/21   [provider]  metFORMIN (GLUCOPHAGE) 500 MG tablet TAKE 2 TABLETS  BY MOUTH TWICE DAILY WITH A MEAL 11/03/22   Hawks, Neysa Bonito A, FNP  oxcarbazepine (TRILEPTAL) 600 MG tablet Take 1 tablet (600 mg total) by mouth 3 (three) times daily. 03/03/22   [name] A, FNP  oxyCODONE-acetaminophen (PERCOCET/ROXICET) 5-325 MG tablet Take 1 tablet by mouth every 6 (six) hours as needed for severe pain. 04/20/22   Bethann Berkshire, MD  pantoprazole (PROTONIX) 40 MG tablet Take 1 tablet (40 mg  total) by mouth daily. 11/03/22   [name] A, FNP  rOPINIRole (REQUIP) 0.25 MG tablet TAKE 1 TO 2 TABLETS BY MOUTH AT BEDTIME FOR  RESTLESS  LEGS 11/03/22   [name] A, FNP  Semaglutide,0.25 or 0.5MG /DOS, 2 MG/3ML SOPN Inject 0.5 mg into the skin once a week. 01/03/23   Junie Spencer, FNP  traMADol (ULTRAM) 50 MG tablet Take 1 tablet (50 mg total) by mouth daily. 11/03/22   Junie Spencer, FNP                                                                                                                                    Past Surgical History Past Surgical History:  Procedure Laterality Date   NO PAST SURGERIES     Family History Family History  Problem Relation Age of Onset   Diabetes Mother    Hyperlipidemia Mother    Hypertension Mother    Cancer Father    Lung cancer Father    Heart disease Sister     Social History Social History   Tobacco Use   Smoking status: Never    Passive exposure: Past   Smokeless tobacco: Never  Vaping Use   Vaping status: Never Used  Substance Use Topics   Alcohol use: No   Drug use: No   Allergies Morphine and codeine and Statins  Review of Systems Review of Systems  Musculoskeletal:  Positive for back pain.    Physical Exam Vital Signs  I have reviewed the triage vital signs BP 126/81 (BP Location: Right Arm)   Pulse 85   Temp 98.6 F (37 C) (Oral)   Resp 16   Ht 6\' 1"  (1.854 m)   Wt 129.3 kg   SpO2 96%   BMI 37.60 kg/m   Physical Exam Constitutional:      General: He is not in acute distress.    Appearance: Normal appearance.  HENT:     Head: Normocephalic and atraumatic.     Nose: No congestion or rhinorrhea.  Eyes:     General:        Right eye: No discharge.        Left eye: No discharge.     Extraocular Movements: Extraocular movements intact.     Pupils: Pupils are equal, round, and reactive to light.  Cardiovascular:     Rate and Rhythm: Normal rate and regular rhythm.     Heart sounds: No murmur  heard. Pulmonary:  Effort: No respiratory distress.     Breath sounds: No wheezing or rales.  Abdominal:     General: There is no distension.     Tenderness: There is no abdominal tenderness.  Musculoskeletal:        General: Tenderness present. Normal range of motion.     Cervical back: Normal range of motion.  Skin:    General: Skin is warm and dry.  Neurological:     General: No focal deficit present.     Mental Status: He is alert.     ED Results and Treatments Labs (all labs ordered are listed, but only abnormal results are displayed) Labs Reviewed - No data to display                                                                                                                        Radiology No results found.  Pertinent labs & imaging results that were available during my care of the patient were reviewed by me and considered in my medical decision making (see MDM for details).  Medications Ordered in ED Medications  ketorolac (TORADOL) 15 MG/ML injection 15 mg (has no administration in time range)  lidocaine (LIDODERM) 5 % 1 patch (has no administration in time range)                                                                                                                                     Procedures Procedures  (including critical care time)  Medical Decision Making / ED Course   This patient presents to the ED for concern of back pain, this involves an extensive number of treatment options, and is a complaint that carries with it a high risk of complications and morbidity.  The differential diagnosis includes muscular strain/spasm, lumbago, disc herniation, spinal fracture, cauda equina, epidural abscess or hematoma, psoas abscess, pyelonephritis  MDM: Patient seen emergency room for evaluation of back pain.  Physical exam with reproducible lumbar paraspinal tenderness on the right.  No central bony tenderness.  No palpable pulsatile mass in the  abdomen or abdominal pain to palpation.  Given recent heavy lifting and history of traumatic realignment with his chiropractor, suspect that this is a muscle strain specifically the QL.  He was treated with Toradol and a Lidoderm patch and on reevaluation his symptoms have significant improved.  He will start a Tylenol regimen and reach out  to his PCP to discuss options for physical therapy.  Very low suspicion for AAA today given stable vital signs, no abdominal pain and no palpable pulsatile abdominal mass.  With normal neurologic exam, low suspicion for cauda equina.  Does not meet inpatient criteria at this time and patient discharged with outpatient follow-up with return precautions which she voiced understanding.   Additional history obtained: -Additional history obtained from wife -External records from outside source obtained and reviewed including: Chart review including previous notes, labs, imaging, consultation notes     Medicines ordered and prescription drug management: Meds ordered this encounter  Medications   ketorolac (TORADOL) 15 MG/ML injection 15 mg   lidocaine (LIDODERM) 5 % 1 patch    -I have reviewed the patients home medicines and have made adjustments as needed  Critical interventions none   Cardiac Monitoring: The patient was maintained on a cardiac monitor.  I personally viewed and interpreted the cardiac monitored which showed an underlying rhythm of: NSR  Social Determinants of Health:  Factors impacting patients care include: Recently moving houses with heavy lifting   Reevaluation: After the interventions noted above, I reevaluated the patient and found that they have :improved  Co morbidities that complicate the patient evaluation  Past Medical History:  Diagnosis Date   Diabetes mellitus without complication (HCC)    H/O echocardiogram 09/2017   normal EF   Headache    Hyperlipidemia    Hypertension    Light headedness    OSA on CPAP     Palpitations    Tingling    toes   Trigeminal neuralgia       Dispostion: I considered admission for this patient, but at this time he does not meet inpatient criteria for admission and he is safe for discharge with outpatient follow-up and return precautions of which he and his wife voiced understanding.     Final Clinical Impression(s) / ED Diagnoses Final diagnoses:  None     @PCDICTATION @    Glendora Score, MD 04/04/23 1553

## 2023-04-05 ENCOUNTER — Telehealth: Payer: Self-pay

## 2023-04-05 NOTE — Transitions of Care (Post Inpatient/ED Visit) (Signed)
04/05/2023  Name: Matthew Pennington MRN: [id number] DOB: 1965-12-01  Today's TOC FU Call Status: Today's TOC FU Call Status:: Successful TOC FU Call Completed TOC FU Call Complete Date: 04/05/23 Patient's Name and Date of Birth confirmed.  Transition Care Management Follow-up Telephone Call Date of Discharge: 04/04/23 [facility] Type of Discharge: Emergency Department Reason for ED Visit: Other: (low back pain) How have you been since you were released from the hospital?: Better Any questions or concerns?: No  Items Reviewed: Did you receive and understand the discharge instructions provided?: Yes Any new allergies since your discharge?: No Dietary orders reviewed?: Yes Do you have support at home?: Yes People in Home: spouse  Medications Reviewed Today: Medications Reviewed Today     Reviewed by Karena Addison, LPN (Licensed Practical Nurse) on 04/05/23 at 1551  Med List Status: <None>   Medication Order Taking? Sig Documenting Provider Last Dose Status Informant  acetaminophen (TYLENOL) 500 MG tablet [id number]  Take 2 tablets (1,000 mg total) by mouth every 8 (eight) hours. Kommor, Madison, MD  Active   Blood Glucose Calibration (ONETOUCH VERIO) High SOLN [id number] No  [provider] Taking Active   Blood Glucose Monitoring Suppl (ONETOUCH VERIO REFLECT) w/Device KIT [id number] No  [provider] Taking Active   desvenlafaxine (PRISTIQ) 50 MG 24 hr tablet [id number] No Take 1 tablet (50 mg total) by mouth daily. Junie Spencer, FNP Taking Active   diclofenac Sodium (VOLTAREN) 1 % GEL [id number] No Apply 4 g topically 4 (four) times daily. St [place] Lighter, Dois Davenport, NP Taking Active   enalapril (VASOTEC) 20 MG tablet [id number] No Take 1 tablet (20 mg total) by mouth daily. Junie Spencer, FNP Taking Active   ezetimibe (ZETIA) 10 MG tablet [id number] No Take 1 tablet (10 mg total) by mouth daily. Junie Spencer, FNP Taking Active    gabapentin (NEURONTIN) 300 MG capsule [id number] No Take 1 capsule (300 mg total) by mouth 3 (three) times daily. Junie Spencer, FNP Taking Active   glucose blood (ONETOUCH VERIO) test strip [id number] No  [provider] Taking Active   ibuprofen (ADVIL) 800 MG tablet [id number] No Take 1 tablet (800 mg total) by mouth daily. Junie Spencer, FNP Taking Active   Lancet Devices (SIMPLE DIAGNOSTICS LANCING DEV) MISC [id number] No Apply topically. [provider] Taking Active   lidocaine (LIDODERM) 5 % [id number]  Place 1 patch onto the skin daily. Remove & Discard patch within 12 hours or as directed by MD Kommor, Wyn Forster, MD  Active   metFORMIN (GLUCOPHAGE) 500 MG tablet [id number] No TAKE 2 TABLETS BY MOUTH TWICE DAILY WITH A MEAL Hawks, Christy A, FNP Taking Active   naproxen (NAPROSYN) 375 MG tablet [id number]  Take 1 tablet (375 mg total) by mouth 2 (two) times daily. Kommor, Madison, MD  Active   oxcarbazepine (TRILEPTAL) 600 MG tablet [id number] No Take 1 tablet (600 mg total) by mouth 3 (three) times daily. Junie Spencer, FNP Taking Active   oxyCODONE-acetaminophen (PERCOCET/ROXICET) 5-325 MG tablet [id number] No Take 1 tablet by mouth every 6 (six) hours as needed for severe pain. Bethann Berkshire, MD Taking Active   pantoprazole (PROTONIX) 40 MG tablet [id number] No Take 1 tablet (40 mg total) by mouth daily. [name] A, FNP Taking Active   rOPINIRole (REQUIP) 0.25 MG tablet [id number] No TAKE 1 TO 2 TABLETS BY MOUTH AT BEDTIME FOR  RESTLESS  LEGS Hawks, Edilia Bo, FNP Taking Active   Semaglutide,0.25  or 0.5MG /DOS, 2 MG/3ML SOPN [id number] No Inject 0.5 mg into the skin once a week. Junie Spencer, FNP Taking Active   traMADol (ULTRAM) 50 MG tablet [id number] No Take 1 tablet (50 mg total) by mouth daily. Junie Spencer, FNP Taking Active             Home Care and Equipment/Supplies: Were Home Health Services Ordered?: NA Any new equipment or medical supplies  ordered?: NA  Functional Questionnaire: Do you need assistance with bathing/showering or dressing?: No Do you need assistance with meal preparation?: No Do you need assistance with eating?: No Do you have difficulty maintaining continence: No Do you need assistance with getting out of bed/getting out of a chair/moving?: No Do you have difficulty managing or taking your medications?: No  Follow up appointments reviewed: PCP Follow-up appointment confirmed?: No (declined) MD Provider Line Number:[phone] Given: No Specialist Hospital Follow-up appointment confirmed?: NA Do you need transportation to your follow-up appointment?: No Do you understand care options if your condition(s) worsen?: Yes-patient verbalized understanding    SIGNATURE Karena Addison, LPN [facility] Nurse Health Advisor Direct Dial [phone]

## 2023-04-07 ENCOUNTER — Telehealth: Payer: Medicare Other | Admitting: Physician Assistant

## 2023-04-07 ENCOUNTER — Ambulatory Visit: Payer: Self-pay | Admitting: Family

## 2023-04-07 DIAGNOSIS — M5441 Lumbago with sciatica, right side: Secondary | ICD-10-CM | POA: Diagnosis not present

## 2023-04-07 MED ORDER — PREDNISONE 20 MG PO TABS: 40.0000 mg | ORAL_TABLET | Freq: Every day | ORAL | 0 refills | Status: DC

## 2023-04-07 MED ORDER — BACLOFEN 10 MG PO TABS: 10.0000 mg | ORAL_TABLET | Freq: Three times a day (TID) | ORAL | 0 refills | Status: AC

## 2023-04-07 NOTE — Progress Notes (Signed)
Virtual Visit Consent   Matthew Pennington, you are scheduled for a virtual visit with a [facility] provider today. Just as with appointments in the office, your consent must be obtained to participate. Your consent will be active for this visit and any virtual visit you may have with one of our providers in the next 365 days. If you have a MyChart account, a copy of this consent can be sent to you electronically.  As this is a virtual visit, video technology does not allow for your provider to perform a traditional examination. This may limit your provider's ability to fully assess your condition. If your provider identifies any concerns that need to be evaluated in person or the need to arrange testing (such as labs, EKG, etc.), we will make arrangements to do so. Although advances in technology are sophisticated, we cannot ensure that it will always work on either your end or our end. If the connection with a video visit is poor, the visit may have to be switched to a telephone visit. With either a video or telephone visit, we are not always able to ensure that we have a secure connection.  By engaging in this virtual visit, you consent to the provision of healthcare and authorize for your insurance to be billed (if applicable) for the services provided during this visit. Depending on your insurance coverage, you may receive a charge related to this service.  I need to obtain your verbal consent now. Are you willing to proceed with your visit today? Matthew Pennington has provided verbal consent on 04/07/2023 for a virtual visit (video or telephone). Margaretann Loveless, PA-C  Date: 04/07/2023 5:03 PM  Virtual Visit via Video Note   I, Margaretann Loveless, connected with  Matthew Pennington  ([id number], 03-Aug-1965) on 04/07/23 at  4:45 PM EST by a video-enabled telemedicine application and verified that I am speaking with the correct person using two identifiers.  Location: Patient: Virtual Visit Location Patient:  Home Provider: Virtual Visit Location Provider: Home Office   I discussed the limitations of evaluation and management by telemedicine and the availability of in person appointments. The patient expressed understanding and agreed to proceed.    History of Present Illness: Matthew Pennington is a 57 y.o. who identifies as a male who was assigned male at birth, and is being seen today for acute on chronic back pain.  HPI: Back Pain This is a new problem. The current episode started more than 1 month ago. The problem occurs constantly. The problem has been gradually worsening since onset. The pain is present in the gluteal and lumbar spine. The quality of the pain is described as aching, cramping and shooting. The pain radiates to the right thigh and right knee. The pain is at a severity of 10/10. The pain is severe. The pain is The same all the time. The symptoms are aggravated by bending. Stiffness is present All day. Associated symptoms include leg pain and weakness. Pertinent negatives include no bladder incontinence, bowel incontinence, fever, headaches, paresis, paresthesias, pelvic pain or perianal numbness. Risk factors include lack of exercise, sedentary lifestyle and obesity. He has tried NSAIDs (Toradol IM, Medrol IM, Lidoderm patches, TENS unit) for the symptoms. The treatment provided no relief.    Was seen in PCP office on 02/21/23 for same issue and provided IM Medrol 80mg  and IM Toradol 60mg . Then seen again in PCP office on 03/30/23 for same and received same treatment. Had to present to ER on 04/04/23 and  was give Naproxen 375mg , Lidoderm patch 5%, and IM Toradol. Had improvements in symptoms until today.  Takes Gabapentin 300mg  TID for trigeminal neuralgia.  Has Percocet but does not take often.  Has had Flexeril and had bad reaction.   He is diabetic and blood sugars are running between 180-190.  Problems:  Patient Active Problem List   Diagnosis Date Noted   Pain of left hip  01/02/2023   Fibromyalgia 07/26/2022   Calcific tendonitis 07/26/2022   Polyarthralgia 06/22/2022   Ureteral calculus, right 04/27/2022   Nephrolithiasis 04/27/2022   Osteoarthritis of carpometacarpal (CMC) joint of thumb 04/12/2022   Statin myopathy 03/03/2022   Right lateral epicondylitis 02/11/2022   Left lateral epicondylitis 02/11/2022   Arthritis of carpometacarpal (CMC) joint of right thumb 06/08/2021   Hepatic steatosis 12/24/2019   Gastroesophageal reflux disease 08/23/2019   Controlled substance agreement signed 08/23/2019   Moderate recurrent major depression (HCC) 08/23/2019   OSA (obstructive sleep apnea) 12/05/2017   GAD (generalized anxiety disorder) 11/24/2017   Leg cramps, sleep related 11/24/2017   Hypertension associated with diabetes (HCC) 03/29/2017   Diabetes mellitus (HCC) 03/29/2017   Hyperlipidemia associated with type 2 diabetes mellitus (HCC) 03/29/2017   Diverticulosis of large intestine without hemorrhage 03/29/2017   History of prostatitis 03/29/2017   Light headedness    Tingling    Weakness    Trigeminal neuralgia     Allergies:  Allergies  Allergen Reactions   Morphine And Codeine Other (See Comments)    "Makes me go Crazy"   Statins Other (See Comments)    Joint/mucles pains   Medications:  Current Outpatient Medications:    baclofen (LIORESAL) 10 MG tablet, Take 1 tablet (10 mg total) by mouth 3 (three) times daily., Disp: 30 each, Rfl: 0   predniSONE (DELTASONE) 20 MG tablet, Take 2 tablets (40 mg total) by mouth daily with breakfast., Disp: 14 tablet, Rfl: 0   acetaminophen (TYLENOL) 500 MG tablet, Take 2 tablets (1,000 mg total) by mouth every 8 (eight) hours., Disp: 180 tablet, Rfl: 0   Blood Glucose Calibration (ONETOUCH VERIO) High SOLN, , Disp: , Rfl:    Blood Glucose Monitoring Suppl (ONETOUCH VERIO REFLECT) w/Device KIT, , Disp: , Rfl:    desvenlafaxine (PRISTIQ) 50 MG 24 hr tablet, Take 1 tablet (50 mg total) by mouth daily.,  Disp: 90 tablet, Rfl: 0   diclofenac Sodium (VOLTAREN) 1 % GEL, Apply 4 g topically 4 (four) times daily., Disp: 350 g, Rfl: 2   enalapril (VASOTEC) 20 MG tablet, Take 1 tablet (20 mg total) by mouth daily., Disp: 90 tablet, Rfl: 0   ezetimibe (ZETIA) 10 MG tablet, Take 1 tablet (10 mg total) by mouth daily., Disp: 90 tablet, Rfl: 0   gabapentin (NEURONTIN) 300 MG capsule, Take 1 capsule (300 mg total) by mouth 3 (three) times daily., Disp: 270 capsule, Rfl: 0   glucose blood (ONETOUCH VERIO) test strip, , Disp: , Rfl:    ibuprofen (ADVIL) 800 MG tablet, Take 1 tablet (800 mg total) by mouth daily., Disp: 90 tablet, Rfl: 0   Lancet Devices (SIMPLE DIAGNOSTICS LANCING DEV) MISC, Apply topically., Disp: , Rfl:    lidocaine (LIDODERM) 5 %, Place 1 patch onto the skin daily. Remove & Discard patch within 12 hours or as directed by MD, Disp: 30 patch, Rfl: 0   metFORMIN (GLUCOPHAGE) 500 MG tablet, TAKE 2 TABLETS BY MOUTH TWICE DAILY WITH A MEAL, Disp: 360 tablet, Rfl: 0   naproxen (NAPROSYN) 375 MG  tablet, Take 1 tablet (375 mg total) by mouth 2 (two) times daily., Disp: 20 tablet, Rfl: 0   oxcarbazepine (TRILEPTAL) 600 MG tablet, Take 1 tablet (600 mg total) by mouth 3 (three) times daily., Disp: 270 tablet, Rfl: 2   oxyCODONE-acetaminophen (PERCOCET/ROXICET) 5-325 MG tablet, Take 1 tablet by mouth every 6 (six) hours as needed for severe pain., Disp: 20 tablet, Rfl: 0   pantoprazole (PROTONIX) 40 MG tablet, Take 1 tablet (40 mg total) by mouth daily., Disp: 90 tablet, Rfl: 1   rOPINIRole (REQUIP) 0.25 MG tablet, TAKE 1 TO 2 TABLETS BY MOUTH AT BEDTIME FOR  RESTLESS  LEGS, Disp: 180 tablet, Rfl: 1   Semaglutide,0.25 or 0.5MG /DOS, 2 MG/3ML SOPN, Inject 0.5 mg into the skin once a week., Disp: 3 mL, Rfl: 2   traMADol (ULTRAM) 50 MG tablet, Take 1 tablet (50 mg total) by mouth daily., Disp: 20 tablet, Rfl: 2  Observations/Objective: Patient is well-developed, well-nourished in no acute distress.   Resting comfortably at home.  Head is normocephalic, atraumatic.  No labored breathing.  Speech is clear and coherent with logical content.  Patient is alert and oriented at baseline.    Assessment and Plan: 1. Acute right-sided low back pain with right-sided sciatica - baclofen (LIORESAL) 10 MG tablet; Take 1 tablet (10 mg total) by mouth 3 (three) times daily.  Dispense: 30 each; Refill: 0 - predniSONE (DELTASONE) 20 MG tablet; Take 2 tablets (40 mg total) by mouth daily with breakfast.  Dispense: 14 tablet; Refill: 0  - Baclofen added for muscle cramping - Continue Gabapentin, Tylenol, Motrin - Will do small dose of Prednisone, but advised to monitor sugar closely and if close to or over 300 stop and call PCP - Discussed Orthopedic Urgent Care offices that are close proximity if symptoms worsen over the weekend - Should seek further evaluation with ortho spine or neurosurgery for further evaluation for chronic issue  Follow Up Instructions: I discussed the assessment and treatment plan with the patient. The patient was provided an opportunity to ask questions and all were answered. The patient agreed with the plan and demonstrated an understanding of the instructions.  A copy of instructions were sent to the patient via MyChart unless otherwise noted below.    The patient was advised to call back or seek an in-person evaluation if the symptoms worsen or if the condition fails to improve as anticipated.    Margaretann Loveless, PA-C

## 2023-04-07 NOTE — Patient Instructions (Addendum)
Lisa Roca, thank you for joining Margaretann Loveless, PA-C for today's virtual visit.  While this provider is not your primary care provider (PCP), if your PCP is located in our provider database this encounter information will be shared with them immediately following your visit.   A [facility] MyChart account gives you access to today's visit and all your visits, tests, and labs performed at [facility] " click here if you don't have a [facility] MyChart account or go to [email or web address]  Consent: (Patient) Matthew Pennington provided verbal consent for this virtual visit at the beginning of the encounter.  Current Medications:  Current Outpatient Medications:    baclofen (LIORESAL) 10 MG tablet, Take 1 tablet (10 mg total) by mouth 3 (three) times daily., Disp: 30 each, Rfl: 0   predniSONE (DELTASONE) 20 MG tablet, Take 2 tablets (40 mg total) by mouth daily with breakfast., Disp: 14 tablet, Rfl: 0   acetaminophen (TYLENOL) 500 MG tablet, Take 2 tablets (1,000 mg total) by mouth every 8 (eight) hours., Disp: 180 tablet, Rfl: 0   Blood Glucose Calibration (ONETOUCH VERIO) High SOLN, , Disp: , Rfl:    Blood Glucose Monitoring Suppl (ONETOUCH VERIO REFLECT) w/Device KIT, , Disp: , Rfl:    desvenlafaxine (PRISTIQ) 50 MG 24 hr tablet, Take 1 tablet (50 mg total) by mouth daily., Disp: 90 tablet, Rfl: 0   diclofenac Sodium (VOLTAREN) 1 % GEL, Apply 4 g topically 4 (four) times daily., Disp: 350 g, Rfl: 2   enalapril (VASOTEC) 20 MG tablet, Take 1 tablet (20 mg total) by mouth daily., Disp: 90 tablet, Rfl: 0   ezetimibe (ZETIA) 10 MG tablet, Take 1 tablet (10 mg total) by mouth daily., Disp: 90 tablet, Rfl: 0   gabapentin (NEURONTIN) 300 MG capsule, Take 1 capsule (300 mg total) by mouth 3 (three) times daily., Disp: 270 capsule, Rfl: 0   glucose blood (ONETOUCH VERIO) test strip, , Disp: , Rfl:    ibuprofen (ADVIL) 800 MG tablet, Take 1 tablet (800 mg total) by mouth daily.,  Disp: 90 tablet, Rfl: 0   Lancet Devices (SIMPLE DIAGNOSTICS LANCING DEV) MISC, Apply topically., Disp: , Rfl:    lidocaine (LIDODERM) 5 %, Place 1 patch onto the skin daily. Remove & Discard patch within 12 hours or as directed by MD, Disp: 30 patch, Rfl: 0   metFORMIN (GLUCOPHAGE) 500 MG tablet, TAKE 2 TABLETS BY MOUTH TWICE DAILY WITH A MEAL, Disp: 360 tablet, Rfl: 0   naproxen (NAPROSYN) 375 MG tablet, Take 1 tablet (375 mg total) by mouth 2 (two) times daily., Disp: 20 tablet, Rfl: 0   oxcarbazepine (TRILEPTAL) 600 MG tablet, Take 1 tablet (600 mg total) by mouth 3 (three) times daily., Disp: 270 tablet, Rfl: 2   oxyCODONE-acetaminophen (PERCOCET/ROXICET) 5-325 MG tablet, Take 1 tablet by mouth every 6 (six) hours as needed for severe pain., Disp: 20 tablet, Rfl: 0   pantoprazole (PROTONIX) 40 MG tablet, Take 1 tablet (40 mg total) by mouth daily., Disp: 90 tablet, Rfl: 1   rOPINIRole (REQUIP) 0.25 MG tablet, TAKE 1 TO 2 TABLETS BY MOUTH AT BEDTIME FOR  RESTLESS  LEGS, Disp: 180 tablet, Rfl: 1   Semaglutide,0.25 or 0.5MG /DOS, 2 MG/3ML SOPN, Inject 0.5 mg into the skin once a week., Disp: 3 mL, Rfl: 2   traMADol (ULTRAM) 50 MG tablet, Take 1 tablet (50 mg total) by mouth daily., Disp: 20 tablet, Rfl: 2   Medications ordered in this encounter:  Meds ordered this  encounter  Medications   baclofen (LIORESAL) 10 MG tablet    Sig: Take 1 tablet (10 mg total) by mouth 3 (three) times daily.    Dispense:  30 each    Refill:  0    Order Specific Question:   Supervising Provider    Answer:   Merrilee Jansky [[id number]]   predniSONE (DELTASONE) 20 MG tablet    Sig: Take 2 tablets (40 mg total) by mouth daily with breakfast.    Dispense:  14 tablet    Refill:  0    Order Specific Question:   Supervising Provider    Answer:   Merrilee Jansky [id number]     *If you need refills on other medications prior to your next appointment, please contact your pharmacy*  Follow-Up: Call back or seek an  in-person evaluation if the symptoms worsen or if the condition fails to improve as anticipated.  [facility] [phone]  Other Instructions   Orthopedic Urgent Care Location: [facility] [address], [address] [place], [place] [address] Phone: [phone] Convenient hours: Monday - Friday: 11 a.m. - 7 p.m. Visit our website to schedule an appointment online or walk-in during clinic hours. Your well-being is our priority. Move better with [place]!   [facility] [address], [place], [place] [address] URGENT CARE HOURS Monday - Friday: 8:00am to 8:00pm Saturday: 10:00am to 3:00pm [phone]  [facility] [address] [place], [place] [address] CONTACT Phone: [phone] Fax: [phone] URGENT CARE HOURS Monday-Sunday: 9:00AM - 9:00PM  HOLIDAY HOURS Holidays: 8:30AM - 4:30PM    Chronic Back Pain Chronic back pain is back pain that lasts longer than 3 months. The cause of your back pain may not be known. Some common causes include: Wear and tear (degenerative disease) of the bones, disks, or tissues that connect bones to each other (ligaments) in your back. Inflammation and stiffness in your back (arthritis). If you have chronic back pain, you may have times when the pain is more intense (flare-ups). You can also learn to manage the pain with home care. Follow these instructions at home: Watch for any changes in your symptoms. Take these actions to help with your pain: Managing pain and stiffness     If told, put ice on the painful area. You may be told to apply ice for the first 24-48 hours after a flare-up starts. Put ice in a plastic bag. Place a towel between your skin and the bag. Leave the ice on for 20 minutes, 2-3 times per day. If told, apply heat to the affected area as often as told by your health care provider. Use the heat source that your  provider recommends, such as a moist heat pack or a heating pad. Place a towel between your skin and the heat source. Leave the heat on for 20-30 minutes. If your skin turns bright red, remove the ice or heat right away to prevent skin damage. The risk of damage is higher if you cannot feel pain, heat, or cold. Try soaking in a warm tub. Activity        Avoid bending and other activities that make the pain worse. Have good posture when you stand or sit. When you stand, keep your upper back and neck straight, with your shoulders pulled back. Avoid slouching. When you sit, keep your back straight. Relax your shoulders. Do not round your shoulders or pull them backward. Do not sit or  stand in one place for too long. Take brief periods of rest during the day. This will reduce your pain. Resting in a lying or standing position is often better than sitting to rest. When you rest for longer periods, mix in some mild activity or stretching between periods of rest. This will help to prevent stiffness and pain. Get regular exercise. Ask your provider what activities are safe for you. You may have to avoid lifting. Ask your provider how much you can safely lift. If you do lift, always use the right technique. This means you should: Bend your knees. Keep the load close to your body. Avoid twisting. Medicines Take over-the-counter and prescription medicines only as told by your provider. You may need to take medicines for pain and inflammation. These may be taken by mouth or put on the skin. You may also be given muscle relaxants. Ask your provider if the medicine prescribed to you: Requires you to avoid driving or using machinery. Can cause constipation. You may need to take these actions to prevent or treat constipation: Drink enough fluid to keep your pee (urine) pale yellow. Take over-the-counter or prescription medicines. Eat foods that are high in fiber, such as beans, whole grains, and fresh  fruits and vegetables. Limit foods that are high in fat and processed sugars, such as fried or sweet foods. General instructions  Sleep on a firm mattress in a comfortable position. Try lying on your side with your knees slightly bent. If you lie on your back, put a pillow under your knees. Do not use any products that contain nicotine or tobacco. These products include cigarettes, chewing tobacco, and vaping devices, such as e-cigarettes. If you need help quitting, ask your provider. Contact a health care provider if: You have pain that does not get better with rest or medicine. You have new pain. You have a fever. You lose weight quickly. You have trouble doing your normal activities. You feel weak or numb in one or both of your legs or feet. Get help right away if: You are not able to control when you pee or poop. You have severe back pain and: Nausea or vomiting. Pain in your chest or abdomen. Shortness of breath. You faint. These symptoms may be an emergency. Get help right away. Call 911. Do not wait to see if the symptoms will go away. Do not drive yourself to the hospital. This information is not intended to replace advice given to you by your health care provider. Make sure you discuss any questions you have with your health care provider. Document Revised: 01/03/2022 Document Reviewed: 01/03/2022 Elsevier Patient Education  2024 Elsevier Inc.    If you have been instructed to have an in-person evaluation today at a local Urgent Care facility, please use the link below. It will take you to a list of all of our available [facility], including address, phone number and hours of operation. Please do not delay care.  [facility]  If you or a family member do not have a primary care provider, use the link below to schedule a visit and establish care. When you choose a [facility] primary care physician or advanced practice provider, you gain a long-term  partner in health. Find a Primary Care Provider  Learn more about [facility]'s in-office and virtual care options: [facility] - Get Care Now

## 2023-04-07 NOTE — Telephone Encounter (Signed)
  Chief Complaint: right leg pain  Symptoms: right leg pain, cramping in legs, muscle spasms Frequency: constant Pertinent Negatives: Patient denies numbness, sob, chest pain Disposition: [] ED /[x] Urgent Care (no appt availability in office) / [] Appointment(In office/virtual)/ []  [facility]/ [] Home Care/ [] Refused Recommended Disposition /[] [facility]/ []  Follow-up with PCP Additional Notes: Patient called in and reports that he has been having right leg pain that has been getting worse over the past few days. Patient went to the ER on Monday 11/4 where he was given a toradol shot that helped but OTC medication at home has not been helping with his 10/10 pain today. This RN attempted to schedule patient appointment for today, but due to no availability scheduled a virtual UC visit. Patient verbalized understanding.    Copied from CRM [id number]. Topic: Clinical - Red Word Triage >> Apr 07, 2023  2:22 PM Almira Coaster wrote: Red Word that prompted transfer to Nurse Triage: Patient's wife called because patient was recently released from the hospital, he's currently at home losing his balance, and severe muscle pain/spasm.   Reason for Disposition  [1] Thigh or calf pain AND [2] only 1 side AND [3] present > 1 hour (Exception: Chronic unchanged pain.)  Answer Assessment - Initial Assessment Questions 1. ONSET: "When did the pain start?"  "A couple of days ago" 2. LOCATION: "Where is the pain located?"      Right leg cramping 3. PAIN: "How bad is the pain?"    (Scale 1-10; or mild, moderate, severe)   -  MILD (1-3): doesn't interfere with normal activities    -  MODERATE (4-7): interferes with normal activities (e.g., work or school) or awakens from sleep, limping    -  SEVERE (8-10): excruciating pain, unable to do any normal activities, unable to walk     10/10 4. WORK OR EXERCISE: "Has there been any recent work or exercise that involved this part of the body?"      Maybe  with moving houses 5. CAUSE: "What do you think is causing the leg pain?"     Maybe something with my nerves 6. OTHER SYMPTOMS: "Do you have any other symptoms?" (e.g., chest pain, back pain, breathing difficulty, swelling, rash, fever, numbness, weakness)     Back pain  Protocols used: Leg Pain-A-AH

## 2023-04-14 ENCOUNTER — Other Ambulatory Visit: Payer: Self-pay | Admitting: Family

## 2023-04-14 MED ORDER — MELOXICAM 15 MG PO TABS: 15.0000 mg | ORAL_TABLET | Freq: Every day | ORAL | 4 refills | Status: DC

## 2023-04-19 ENCOUNTER — Other Ambulatory Visit: Payer: Self-pay | Admitting: Family

## 2023-04-19 DIAGNOSIS — E1169 Type 2 diabetes mellitus with other specified complication: Secondary | ICD-10-CM

## 2023-04-19 DIAGNOSIS — E1159 Type 2 diabetes mellitus with other circulatory complications: Secondary | ICD-10-CM

## 2023-04-24 ENCOUNTER — Ambulatory Visit: Payer: Medicare Other

## 2023-05-26 ENCOUNTER — Ambulatory Visit: Payer: Self-pay | Admitting: Family

## 2023-05-26 ENCOUNTER — Telehealth: Payer: Medicare Other | Admitting: Family

## 2023-05-26 DIAGNOSIS — R059 Cough, unspecified: Secondary | ICD-10-CM | POA: Diagnosis not present

## 2023-05-26 DIAGNOSIS — J22 Unspecified acute lower respiratory infection: Secondary | ICD-10-CM | POA: Diagnosis not present

## 2023-05-26 NOTE — Telephone Encounter (Signed)
Patient called back in stating that he does not want the virtual appt that was scheduled for him this morning. This RN offered to schedule in person UC for patient, to which patient refused. Patient states he just wanted to let his doctor know that he is going to go to the ED instead to be evaluated as he thinks he has pneumonia. Patient denies change in symptoms since his previous triage call this morning.

## 2023-05-26 NOTE — Telephone Encounter (Signed)
Copied from CRM [id number]. Topic: Clinical - Pink Word Triage >> May 26, 2023  8:08 AM Alphonzo Lemmings O wrote: Reason for Triage: patient is having a hard time breathing congested in the chest . Congested in the nose . Feel like its pneumonia .  Chief Complaint:  Patient states " I think I have Pneumonia" Sick for two days Coughing continuously , unable to get any thing up. Sinus drainage. Mild Short of breath  Very Low energy. Symptoms: Non Productive Cough, unable to get any thing up. Sinus drainage- Dark yellow Frequency: Continually  Coughing Pertinent Negatives: Patient denies chest pain.  Disposition: [] ED /[] Urgent Care (no appt availability in office) / [x] Appointment(In office/virtual)/ []  [facility]/ [] Home Care/ [] Refused Recommended Disposition /[] [facility]/ []  Follow-up with PCP Additional Notes: P Reason for Disposition  Continuous (nonstop) coughing interferes with work, school, or sleeping  [1] MILD difficulty breathing (e.g., minimal/no SOB at rest, SOB with walking, pulse <100) AND [2] NEW-onset or WORSE than normal  Answer Assessment - Initial Assessment Questions 1. RESPIRATORY STATUS: "Describe your breathing?" (e.g., wheezing, shortness of breath, unable to speak, severe coughing)       Shortness of Breath 2. ONSET: "When did this breathing problem begin?"      Two days 3. PATTERN  "Does the difficult breathing come and go, or has it been constant since it started?"       Constant - Heavy in my chest 4. SEVERITY: "How bad is your breathing?" (e.g., mild, moderate, severe)      - MODERATE: SOB at rest, SOB with minimal exertion and prefers to sit, cannot lie down flat, speaks in phrases, mild retractions, audible wheezing, pulse 100-120.   Wears a CPAP- Could not wear it  last night night  5. RECURRENT SYMPTOM: "Have you had difficulty breathing before?" If Yes, ask: "When was the last time?" and "What happened that time?"       Denies. A year  ago. 6. CARDIAC HISTORY: "Do you have any history of heart disease?" (e.g., heart attack, angina, bypass surgery, angioplasty)     Denies 7. LUNG HISTORY: "Do you have any history of lung disease?"  (e.g., pulmonary embolus, asthma, emphysema)     Denies 8. CAUSE: "What do you think is causing the breathing problem?"       Sinus Infection - Dark  Yellow drainage - Unable to cough up . 9. OTHER SYMPTOMS: "Do you have any other symptoms? (e.g., dizziness, runny nose, cough, chest pain, fever)     Denies 10. O2 SATURATION MONITOR:  "Do you use an oxygen saturation monitor (pulse oximeter) at home?" If Yes, ask: "What is your reading (oxygen level) today?" "What is your usual oxygen saturation reading?" (e.g., 95%)       (92%)  12. TRAVEL: "Have you traveled out of the country in the last month?" (e.g., travel history, exposures)        Denies  Protocols used: Breathing Difficulty-A-AH

## 2023-05-26 NOTE — Telephone Encounter (Signed)
05/26/2023 1345pm Addendum to complete Additional Notes-P   Copied from CRM #[id number]. Topic: Clinical - Pink Word Triage >> May 26, 2023  8:08 AM Alphonzo Lemmings O wrote: Reason for Triage: patient is having a hard time breathing congested in the chest . Congested in the nose . Feel like its pneumonia .  Chief Complaint:  Patient states " I think I have Pneumonia" Sick for two days Coughing continuously , unable to get any thing up. Sinus drainage. Mild Short of breath  Very Low energy. Symptoms: Non Productive Cough, unable to get any thing up. Sinus drainage- Dark yellow Frequency: Continually  Coughing Pertinent Negatives: Patient denies chest pain.  Disposition: [] ED /[] Urgent Care (no appt availability in office) / [x] Appointment(In office/virtual)/ []  [facility]/ [] Home Care/ [] Refused Recommended Disposition /[] [facility]/ []  Follow-up with PCP Additional Notes: Patient was quite resistant in going to the Emergency Room .  Patient requested  RN Agent to ask office because they had worked him before.  RN Agent contacted [facility]  for an appointment. [facility] offered a Diplomatic Services operational officer with the patients provider [name] FNP.   [facility] stated provider will assess ,then order what the patient need.  Clinical Access stated no appointment time would be scheduled because she works the patient in her schedule and that I would likely be this morning. RN Agent called patient back and advised the availability of an a virtual appointment with his provider. Patient stated how will that work because he needed a chest xray. RN Agent explained likely the provider would order what was needed. Rn Agent reiterated patient could go to the ER preferably or in person Urgent Care. Reason for Disposition  Continuous (nonstop) coughing interferes with work, school, or sleeping  [1] MILD difficulty breathing (e.g., minimal/no SOB at rest, SOB with walking, pulse <100) AND  [2] NEW-onset or WORSE than normal  Answer Assessment - Initial Assessment Questions 1. RESPIRATORY STATUS: "Describe your breathing?" (e.g., wheezing, shortness of breath, unable to speak, severe coughing)       Shortness of Breath 2. ONSET: "When did this breathing problem begin?"      Two days 3. PATTERN  "Does the difficult breathing come and go, or has it been constant since it started?"       Constant - Heavy in my chest 4. SEVERITY: "How bad is your breathing?" (e.g., mild, moderate, severe)      - MODERATE: SOB at rest, SOB with minimal exertion and prefers to sit, cannot lie down flat, speaks in phrases, mild retractions, audible wheezing, pulse 100-120.   Wears a CPAP- Could not wear it  last night night  5. RECURRENT SYMPTOM: "Have you had difficulty breathing before?" If Yes, ask: "When was the last time?" and "What happened that time?"       Denies. A year ago. 6. CARDIAC HISTORY: "Do you have any history of heart disease?" (e.g., heart attack, angina, bypass surgery, angioplasty)     Denies 7. LUNG HISTORY: "Do you have any history of lung disease?"  (e.g., pulmonary embolus, asthma, emphysema)     Denies 8. CAUSE: "What do you think is causing the breathing problem?"       Sinus Infection - Dark  Yellow drainage - Unable to cough up . 9. OTHER SYMPTOMS: "Do you have any other symptoms? (e.g., dizziness, runny nose, cough, chest pain, fever)     Denies 10. O2 SATURATION MONITOR:  "Do you use an oxygen saturation monitor (  pulse oximeter) at home?" If Yes, ask: "What is your reading (oxygen level) today?" "What is your usual oxygen saturation reading?" (e.g., 95%)       (92%)  12. TRAVEL: "Have you traveled out of the country in the last month?" (e.g., travel history, exposures)        Denies  Protocols used: Breathing Difficulty-A-AH

## 2023-06-19 ENCOUNTER — Other Ambulatory Visit: Payer: Self-pay | Admitting: *Deleted

## 2023-06-19 DIAGNOSIS — G500 Trigeminal neuralgia: Secondary | ICD-10-CM

## 2023-06-22 DIAGNOSIS — H5213 Myopia, bilateral: Secondary | ICD-10-CM | POA: Diagnosis not present

## 2023-06-22 DIAGNOSIS — E119 Type 2 diabetes mellitus without complications: Secondary | ICD-10-CM | POA: Diagnosis not present

## 2023-06-22 DIAGNOSIS — H2513 Age-related nuclear cataract, bilateral: Secondary | ICD-10-CM | POA: Diagnosis not present

## 2023-06-22 DIAGNOSIS — H35033 Hypertensive retinopathy, bilateral: Secondary | ICD-10-CM | POA: Diagnosis not present

## 2023-06-26 DIAGNOSIS — L821 Other seborrheic keratosis: Secondary | ICD-10-CM | POA: Diagnosis not present

## 2023-06-30 ENCOUNTER — Telehealth: Payer: Self-pay | Admitting: Family

## 2023-06-30 NOTE — Telephone Encounter (Signed)
TC back to [facility] they are looking for OV notes from 03/30/23 visit for compliance for CPAP Please advise

## 2023-06-30 NOTE — Telephone Encounter (Signed)
Copied from CRM [id number]. Topic: Medical Record Request - Other >> Jun 29, 2023  4:57 PM Shelah Lewandowsky wrote: Reason for CRM: Asher Muir with [facility] calling to retrieve office visit notes from 10/31 visit, looking for objective evidence for compliance - [phone]  fax [phone]

## 2023-07-03 NOTE — Telephone Encounter (Signed)
OV notes from 11/03/22 faxed to [facility] [phone]

## 2023-07-03 NOTE — Telephone Encounter (Signed)
It was discussed in our office visit on 11/03/22.

## 2023-07-19 ENCOUNTER — Other Ambulatory Visit: Payer: Self-pay | Admitting: Family

## 2023-07-19 DIAGNOSIS — G500 Trigeminal neuralgia: Secondary | ICD-10-CM

## 2023-07-19 DIAGNOSIS — E1169 Type 2 diabetes mellitus with other specified complication: Secondary | ICD-10-CM

## 2023-07-19 DIAGNOSIS — I152 Hypertension secondary to endocrine disorders: Secondary | ICD-10-CM

## 2023-07-28 ENCOUNTER — Ambulatory Visit (INDEPENDENT_AMBULATORY_CARE_PROVIDER_SITE_OTHER): Payer: Medicare Other | Admitting: Family

## 2023-07-28 ENCOUNTER — Encounter: Payer: Self-pay | Admitting: Family

## 2023-07-28 VITALS — BP 121/89 | HR 81 | Temp 97.5°F | Ht 73.0 in | Wt 284.4 lb

## 2023-07-28 DIAGNOSIS — K219 Gastro-esophageal reflux disease without esophagitis: Secondary | ICD-10-CM

## 2023-07-28 DIAGNOSIS — M545 Low back pain, unspecified: Secondary | ICD-10-CM | POA: Diagnosis not present

## 2023-07-28 DIAGNOSIS — G4733 Obstructive sleep apnea (adult) (pediatric): Secondary | ICD-10-CM | POA: Diagnosis not present

## 2023-07-28 DIAGNOSIS — I152 Hypertension secondary to endocrine disorders: Secondary | ICD-10-CM | POA: Diagnosis not present

## 2023-07-28 DIAGNOSIS — E785 Hyperlipidemia, unspecified: Secondary | ICD-10-CM | POA: Diagnosis not present

## 2023-07-28 DIAGNOSIS — F411 Generalized anxiety disorder: Secondary | ICD-10-CM

## 2023-07-28 DIAGNOSIS — T466X5A Adverse effect of antihyperlipidemic and antiarteriosclerotic drugs, initial encounter: Secondary | ICD-10-CM

## 2023-07-28 DIAGNOSIS — F331 Major depressive disorder, recurrent, moderate: Secondary | ICD-10-CM

## 2023-07-28 DIAGNOSIS — G720 Drug-induced myopathy: Secondary | ICD-10-CM | POA: Diagnosis not present

## 2023-07-28 DIAGNOSIS — Z79899 Other long term (current) drug therapy: Secondary | ICD-10-CM

## 2023-07-28 DIAGNOSIS — G8929 Other chronic pain: Secondary | ICD-10-CM | POA: Diagnosis not present

## 2023-07-28 DIAGNOSIS — E1159 Type 2 diabetes mellitus with other circulatory complications: Secondary | ICD-10-CM

## 2023-07-28 DIAGNOSIS — E1169 Type 2 diabetes mellitus with other specified complication: Secondary | ICD-10-CM | POA: Diagnosis not present

## 2023-07-28 DIAGNOSIS — G500 Trigeminal neuralgia: Secondary | ICD-10-CM

## 2023-07-28 LAB — BAYER DCA HB A1C WAIVED: HB A1C (BAYER DCA - WAIVED): 9.9 % — ABNORMAL HIGH (ref 4.8–5.6)

## 2023-07-28 MED ORDER — IBUPROFEN 800 MG PO TABS: 800.0000 mg | ORAL_TABLET | Freq: Three times a day (TID) | ORAL | 2 refills | Status: DC | PRN

## 2023-07-28 MED ORDER — TRAMADOL HCL 50 MG PO TABS: 50.0000 mg | ORAL_TABLET | Freq: Every day | ORAL | 2 refills | Status: DC

## 2023-07-28 MED ORDER — DAPAGLIFLOZIN PROPANEDIOL 5 MG PO TABS: 5.0000 mg | ORAL_TABLET | Freq: Every day | ORAL | 1 refills | Status: DC

## 2023-07-28 NOTE — Patient Instructions (Signed)
 Living With Sleep Apnea Sleep apnea is a condition that affects your breathing while you're sleeping. Your tongue or the tissue in your throat may block the flow of air while you sleep. You may have shallow breathing or stop breathing for short periods of time. The breaks in breathing interrupt the deep sleep that you need to feel rested. Even if you don't wake up from the gaps in breathing, you may feel tired during the day. People with sleep apnea may snore loudly. You may have a headache in the morning and feel anxious or depressed. How can sleep apnea affect me? Sleep apnea increases your chances of being very tired during the day. This is called daytime fatigue. Sleep apnea can also increase your risk of: Heart attack. Stroke. Obesity. Type 2 diabetes. Heart failure. Irregular heartbeat. High blood pressure. If you are very tired during the day, you may be more likely to: Not do well in school or at work. Fall asleep while driving. Have trouble paying attention. Develop depression or anxiety. Have problems having sex. This is called sexual dysfunction. What actions can I take to manage sleep apnea? Sleep apnea treatment  If you were given a device to open your airway while you sleep, use it only as told by your health care provider. You may be given: An oral appliance. This is a mouthpiece that shifts your lower jaw forward. A continuous positive airway pressure (CPAP) device. This blows air through a mask. A nasal expiratory positive airway pressure (EPAP) device. This has valves that you put into each nostril. A bi-level positive airway pressure (BIPAP) device. This blows air through a mask when you breathe in and breathe out. You may need surgery if other treatments don't work for you. Sleep habits Go to sleep and wake up at the same time every day. This helps set your internal clock for sleeping. If you stay up later than usual on weekends, try to get up in the morning within 2  hours of the time you usually wake up. Try to get at least 7-9 hours of sleep each night. Stop using a computer, tablet, and mobile phone a few hours before bedtime. Do not take long naps during the day. If you nap, limit it to 30 minutes. Have a relaxing bedtime routine. Reading or listening to music may relax you and help you sleep. Use your bedroom only for sleep. Keep your television and computer out of your bedroom. Keep your bedroom cool, dark, and quiet. Use a supportive mattress and pillows. Follow your provider's instructions for other changes to sleep habits. Nutrition Do not eat big meals in the evening. Do not have caffeine in the later part of the day. The effects of caffeine can last for more than 5 hours. Follow your provider's instructions for any changes to what you eat and drink. Lifestyle Do not drink alcohol before bedtime. Alcohol can cause you to fall asleep at first, but then it can cause you to wake up in the middle of the night and have trouble getting back to sleep. Do not smoke, vape, or use nicotine or tobacco. Medicines Take over-the-counter and prescription medicines only as told by your provider. Do not use over-the-counter sleep medicine. You may become dependent on this medicine, and it can make sleep apnea worse. Do not take medicines, such as sedatives and narcotics, unless told to by your provider. Activity Exercise on most days, but avoid exercising in the evening. Exercising near bedtime can interfere with sleeping.  If possible, spend time outside every day. Natural light helps with your internal clock. General information Lose weight if you need to. Stay at a healthy weight. If you are having surgery, make sure to tell your provider that you have sleep apnea. You may need to bring your device with you. Keep all follow-up visits. Your provider will want to check on your condition. Where to find more information National Heart, Lung, and Blood  Institute: [employer] This information is not intended to replace advice given to you by your health care provider. Make sure you discuss any questions you have with your health care provider. Document Revised: 09/07/2022 Document Reviewed: 09/07/2022 Elsevier Patient Education  2024 [employer].

## 2023-07-28 NOTE — Progress Notes (Addendum)
 Subjective:    Patient ID: Matthew Pennington, male    DOB: 24-Nov-1965, 58 y.o.   MRN: [id number]  Chief Complaint  Patient presents with   SLEEP STUDY     NEEDS NOTES   Back Pain    WANTS SHOT    Pt presents to the office today for CPE and chronic follow up. He is followed by Neurologists for Trigeminal neuralgia. She has given him oxycodone as needed for flare ups. He only takes it 3-4 times a year.     He does take Ultram as needed for chronic back pain. He only takes this 1-2 times a month. He never mixes the two.    He is currently going to chiropractor once a week for back and shoulder pain.    He has OSA and uses CPAP nightly.    He is having right hand weakness and is followed by Ortho.    He is having a great deal of heel pain. States he has a cyst on his heel and getting steroid injections and followed by podiatry.    Reports statins causes muscle pain and can not tolerate. Hypertension This is a chronic problem. The current episode started more than 1 year ago. The problem has been resolved since onset. The problem is controlled. Pertinent negatives include no blurred vision, malaise/fatigue, peripheral edema or shortness of breath. Risk factors for coronary artery disease include obesity, dyslipidemia and male gender. The current treatment provides moderate improvement.  Gastroesophageal Reflux He complains of belching and heartburn. This is a chronic problem. The current episode started more than 1 year ago. The problem occurs occasionally. The symptoms are aggravated by certain foods. Associated symptoms include fatigue. Risk factors include obesity. He has tried a PPI for the symptoms. The treatment provided moderate relief.  Hyperlipidemia This is a chronic problem. The current episode started more than 1 year ago. The problem is uncontrolled. Exacerbating diseases include obesity. Pertinent negatives include no shortness of breath. Current antihyperlipidemic treatment  includes diet change and ezetimibe. The current treatment provides mild improvement of lipids. Risk factors for coronary artery disease include dyslipidemia, diabetes mellitus, hypertension, male sex and a sedentary lifestyle.  Diabetes He presents for his follow-up diabetic visit. He has type 2 diabetes mellitus. Associated symptoms include fatigue and foot paresthesias. Pertinent negatives for diabetes include no blurred vision. Diabetic complications include peripheral neuropathy. Risk factors for coronary artery disease include dyslipidemia, diabetes mellitus, hypertension, male sex and sedentary lifestyle. He is following a generally unhealthy diet. His overall blood glucose range is 180-200 mg/dl.  Arthritis Presents for follow-up visit. He complains of pain and stiffness. Affected locations include the left knee, right knee, left MCP, right MCP, right shoulder and left shoulder. His pain is at a severity of 8/10. Associated symptoms include fatigue.  Depression        This is a chronic problem.  The current episode started more than 1 year ago.   The onset quality is gradual.   The problem occurs intermittently.  Associated symptoms include decreased concentration, fatigue, helplessness, hopelessness and sad.  Past treatments include SNRIs - Serotonin and norepinephrine reuptake inhibitors.   Current opioids rx- Ultram 50 mg # meds rx- 20 Effectiveness of current meds-stable Adverse reactions from pain meds-constipation Morphine equivalent- 5  Pill count performed-No Last drug screen - 07/28/23 ( high risk q42m, moderate risk q97m, low risk yearly ) Urine drug screen today- No Was the [facility] reviewed- yes  If yes were their any concerning  findings? - Does get oxycodone, does not take Ultram with the oxycodone     08/23/2019   10:44 AM  Opioid Risk   Alcohol 0  Illegal Drugs 0  Rx Drugs 0  Alcohol 0  Illegal Drugs 0  Rx Drugs 0  Age between 16-45 years  1  Psychological Disease 0   Depression 1  Opioid Risk Tool Scoring 2  Opioid Risk Interpretation Low Risk     Pain contract signed on: 07/28/23    Review of Systems  Constitutional:  Positive for fatigue. Negative for malaise/fatigue.  Eyes:  Negative for blurred vision.  Respiratory:  Negative for shortness of breath.   Gastrointestinal:  Positive for heartburn.  Musculoskeletal:  Positive for stiffness.  Psychiatric/Behavioral:  Positive for decreased concentration.   All other systems reviewed and are negative.   Family History  Problem Relation Age of Onset   Diabetes Mother    Hyperlipidemia Mother    Hypertension Mother    Cancer Father    Lung cancer Father    Heart disease Sister    Social History   Socioeconomic History   Marital status: Married    Spouse name: Matthew Pennington   Number of children: 3   Years of education: Not on file   Highest education level: Bachelor's degree (e.g., BA, AB, BS)  Occupational History   Not on file  Tobacco Use   Smoking status: Never    Passive exposure: Past   Smokeless tobacco: Never  Vaping Use   Vaping status: Never Used  Substance and Sexual Activity   Alcohol use: No   Drug use: No   Sexual activity: Yes    Comment: getting limitted due to his pain  Other Topics Concern   Not on file  Social History Narrative   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   Social Drivers of Corporate investment banker Strain: Low Risk  (03/16/2023)   Overall Financial Resource Strain (CARDIA)    Difficulty of Paying Living Expenses: Not hard at all  Food Insecurity: No Food Insecurity (03/16/2023)   Hunger Vital Sign    Worried About Running Out of Food in the Last Year: Never true    Ran Out of Food in the Last Year: Never true  Transportation Needs: No Transportation Needs (03/16/2023)   PRAPARE - Administrator, Civil Service (Medical): No    Lack of Transportation (Non-Medical): No   Physical Activity: Inactive (03/16/2023)   Exercise Vital Sign    Days of Exercise per Week: 0 days    Minutes of Exercise per Session: 0 min  Stress: No Stress Concern Present (03/16/2023)   [employer] of Occupational Health - Occupational Stress Questionnaire    Feeling of Stress : Not at all  Recent Concern: Stress - Stress Concern Present (01/02/2023)   [employer] of Occupational Health - Occupational Stress Questionnaire    Feeling of Stress : Very much  Social Connections: Moderately Integrated (03/16/2023)   Social Connection and Isolation Panel [NHANES]    Frequency of Communication with Friends and Family: More than three times a week    Frequency of Social Gatherings with Friends and Family: More than three times a week    Attends Religious Services: 1 to 4 times per year    Active Member of [employer] or Organizations: No    Attends Banker Meetings: Never  Marital Status: Married       Objective:   Physical Exam Vitals reviewed.  Constitutional:      General: He is not in acute distress.    Appearance: He is well-developed. He is obese.  HENT:     Head: Normocephalic.     Right Ear: Tympanic membrane normal.     Left Ear: Tympanic membrane normal.  Eyes:     General:        Right eye: No discharge.        Left eye: No discharge.     Pupils: Pupils are equal, round, and reactive to light.  Neck:     Thyroid: No thyromegaly.  Cardiovascular:     Rate and Rhythm: Normal rate and regular rhythm.     Heart sounds: Normal heart sounds. No murmur heard. Pulmonary:     Effort: Pulmonary effort is normal. No respiratory distress.     Breath sounds: Normal breath sounds. No wheezing.  Abdominal:     General: Bowel sounds are normal. There is no distension.     Palpations: Abdomen is soft.     Tenderness: There is no abdominal tenderness.  Musculoskeletal:        General: No tenderness.     Cervical back: Normal range of motion and neck  supple.     Comments: Pain in lumbar with flexion and extension  Skin:    General: Skin is warm and dry.     Findings: No erythema or rash.  Neurological:     Mental Status: He is alert and oriented to person, place, and time.     Cranial Nerves: No cranial nerve deficit.     Deep Tendon Reflexes: Reflexes are normal and symmetric.  Psychiatric:        Mood and Affect: Affect is flat.        Behavior: Behavior normal.        Thought Content: Thought content normal.        Judgment: Judgment normal.       BP 121/89   Pulse 81   Temp (!) 97.5 F (36.4 C) (Temporal)   Ht 6\' 1"  (1.854 m)   Wt 284 lb 6.4 oz (129 kg)   SpO2 95%   BMI 37.52 kg/m      Assessment & Plan:   Draven Natter comes in today with chief complaint of SLEEP STUDY  (NEEDS NOTES) and Back Pain (WANTS SHOT )   Diagnosis and orders addressed:  1. Trigeminal neuralgia - traMADol (ULTRAM) 50 MG tablet; Take 1 tablet (50 mg total) by mouth daily.  Dispense: 20 tablet; Refill: 2 - CMP14+EGFR - CBC with Differential/Platelet  2. Chronic bilateral low back pain without sciatica - traMADol (ULTRAM) 50 MG tablet; Take 1 tablet (50 mg total) by mouth daily.  Dispense: 20 tablet; Refill: 2 - CMP14+EGFR - CBC with Differential/Platelet - ToxASSURE Select 13 (MW), Urine  3. Hypertension associated with diabetes (HCC) - CMP14+EGFR - CBC with Differential/Platelet  4. Type 2 diabetes mellitus with other specified complication, without long-term current use of insulin (HCC) (Primary) - Bayer DCA Hb A1c Waived - CMP14+EGFR - CBC with Differential/Platelet - dapagliflozin propanediol (FARXIGA) 5 MG TABS tablet; Take 1 tablet (5 mg total) by mouth daily before breakfast.  Dispense: 90 tablet; Refill: 1  5. Hyperlipidemia associated with type 2 diabetes mellitus (HCC) - CMP14+EGFR - CBC with Differential/Platelet  6. OSA (obstructive sleep apnea) I have reviewed the [facility] compliance report and the  patient is 93% compliant with APAP from 04/05/23-05/04/23.  - CMP14+EGFR - CBC with Differential/Platelet  7. GAD (generalized anxiety disorder) - CMP14+EGFR - CBC with Differential/Platelet  8. Gastroesophageal reflux disease without esophagitis - CMP14+EGFR - CBC with Differential/Platelet  9. Moderate recurrent major depression (HCC)  - CMP14+EGFR - CBC with Differential/Platelet  10. Statin myopathy - CMP14+EGFR - CBC with Differential/Platelet  11. Controlled substance agreement signed - ToxASSURE Select 13 (MW), Urine   Labs pending Continue current medications  Keep specialists follow up Patient reviewed in [place] controlled database, no flags noted. Contract and drug screen are up to date.  Stress management  Health Maintenance reviewed Diet and exercise encouraged  Follow up plan: 3 months    Tommas Fragmin, FNP

## 2023-07-29 LAB — CBC WITH DIFFERENTIAL/PLATELET
Basophils Absolute: 0.1 10*3/uL (ref 0.0–0.2)
Basos: 1 %
EOS (ABSOLUTE): 0.2 10*3/uL (ref 0.0–0.4)
Eos: 4 %
Hematocrit: 46.9 % (ref 37.5–51.0)
Hemoglobin: 15.5 g/dL (ref 13.0–17.7)
Immature Grans (Abs): 0 10*3/uL (ref 0.0–0.1)
Immature Granulocytes: 0 %
Lymphocytes Absolute: 2.9 10*3/uL (ref 0.7–3.1)
Lymphs: 47 %
MCH: 30.5 pg (ref 26.6–33.0)
MCHC: 33 g/dL (ref 31.5–35.7)
MCV: 92 fL (ref 79–97)
Monocytes Absolute: 0.5 10*3/uL (ref 0.1–0.9)
Monocytes: 8 %
Neutrophils Absolute: 2.4 10*3/uL (ref 1.4–7.0)
Neutrophils: 40 %
Platelets: 245 10*3/uL (ref 150–450)
RBC: 5.08 x10E6/uL (ref 4.14–5.80)
RDW: 12.8 % (ref 11.6–15.4)
WBC: 6.1 10*3/uL (ref 3.4–10.8)

## 2023-07-29 LAB — CMP14+EGFR
ALT: 67 IU/L — ABNORMAL HIGH (ref 0–44)
AST: 39 IU/L (ref 0–40)
Albumin: 4.4 g/dL (ref 3.8–4.9)
Alkaline Phosphatase: 77 IU/L (ref 44–121)
BUN/Creatinine Ratio: 11 (ref 9–20)
BUN: 11 mg/dL (ref 6–24)
Bilirubin Total: 0.3 mg/dL (ref 0.0–1.2)
CO2: 21 mmol/L (ref 20–29)
Calcium: 9.9 mg/dL (ref 8.7–10.2)
Chloride: 99 mmol/L (ref 96–106)
Creatinine, Ser: 0.98 mg/dL (ref 0.76–1.27)
Globulin, Total: 2.5 g/dL (ref 1.5–4.5)
Glucose: 247 mg/dL — ABNORMAL HIGH (ref 70–99)
Potassium: 4.7 mmol/L (ref 3.5–5.2)
Sodium: 137 mmol/L (ref 134–144)
Total Protein: 6.9 g/dL (ref 6.0–8.5)
eGFR: 89 mL/min/{1.73_m2} (ref >59)

## 2023-07-31 ENCOUNTER — Other Ambulatory Visit: Payer: Self-pay | Admitting: Family Medicine

## 2023-07-31 DIAGNOSIS — E1169 Type 2 diabetes mellitus with other specified complication: Secondary | ICD-10-CM

## 2023-07-31 DIAGNOSIS — Z743 Need for continuous supervision: Secondary | ICD-10-CM | POA: Diagnosis not present

## 2023-07-31 DIAGNOSIS — R404 Transient alteration of awareness: Secondary | ICD-10-CM | POA: Diagnosis not present

## 2023-07-31 DIAGNOSIS — M549 Dorsalgia, unspecified: Secondary | ICD-10-CM | POA: Diagnosis not present

## 2023-07-31 DIAGNOSIS — W19XXXA Unspecified fall, initial encounter: Secondary | ICD-10-CM | POA: Diagnosis not present

## 2023-08-01 LAB — TOXASSURE SELECT 13 (MW), URINE

## 2023-08-03 ENCOUNTER — Telehealth: Payer: Self-pay

## 2023-08-03 NOTE — Progress Notes (Signed)
 Care Guide Pharmacy Note  08/03/2023 Name: Treyvone Chelf MRN: [id number] DOB: [birth year]  Referred By: Junie Spencer, FNP Reason for referral: Care Coordination (Outreach to schedule with Pharm d )   Maxamillion Banas is a 58 y.o. year old male who is a primary care patient of Junie Spencer, FNP.  Kadon Andrus was referred to the pharmacist for assistance related to: DMII  Successful contact was made with the patient to discuss pharmacy services including being ready for the pharmacist to call at least 5 minutes before the scheduled appointment time and to have medication bottles and any blood pressure readings ready for review. The patient agreed to meet with the pharmacist via telephone visit on (date/time).08/24/2023  Penne Lash , RMA     [place]  [facility], [facility] Guide  Direct Dial: [phone]  Website: [email or web address]

## 2023-08-11 ENCOUNTER — Telehealth: Payer: Self-pay | Admitting: Family

## 2023-08-11 NOTE — Telephone Encounter (Signed)
 Called unable to lm on vm to get more information. 08/11/23 LS

## 2023-08-11 NOTE — Telephone Encounter (Signed)
 Copied from CRM [id number]. Topic: Clinical - Prescription Issue >> Aug 11, 2023  9:43 AM Marland Kitchen D wrote: Patient called in to see what could be done about his medicine for his diabetes he stated it is to high and wanted to know if his doctor is working on something to get it lowered.

## 2023-08-14 ENCOUNTER — Other Ambulatory Visit: Payer: Self-pay | Admitting: Family

## 2023-08-14 DIAGNOSIS — E119 Type 2 diabetes mellitus without complications: Secondary | ICD-10-CM

## 2023-08-15 ENCOUNTER — Telehealth: Payer: Self-pay | Admitting: Family Medicine

## 2023-08-15 NOTE — Telephone Encounter (Signed)
 Called and spoke with patient she would like to go back on jardiance

## 2023-08-15 NOTE — Telephone Encounter (Unsigned)
 Copied from CRM [id number]. Topic: Clinical - Medical Advice >> Aug 15, 2023 11:52 AM Haroldine Laws wrote: Reason for CRM:  pt is taking Andrena Mews now for his Blood sugar and he is having dizziness and bladder issues.  He would like a nurse to call him back  CB#  [phone]

## 2023-08-17 ENCOUNTER — Other Ambulatory Visit: Payer: Self-pay | Admitting: Family

## 2023-08-17 MED ORDER — EMPAGLIFLOZIN 25 MG PO TABS: 25.0000 mg | ORAL_TABLET | Freq: Every day | ORAL | 2 refills | Status: DC

## 2023-08-17 NOTE — Telephone Encounter (Signed)
 Jardiance 25 mg Prescription sent to pharmacy

## 2023-08-24 ENCOUNTER — Encounter (INDEPENDENT_AMBULATORY_CARE_PROVIDER_SITE_OTHER)

## 2023-08-24 NOTE — Progress Notes (Signed)
 This encounter was created in error - please disregard.

## 2023-09-11 ENCOUNTER — Other Ambulatory Visit: Payer: Self-pay | Admitting: Family

## 2023-09-21 ENCOUNTER — Telehealth: Payer: Self-pay | Admitting: Pharmacist

## 2023-09-21 ENCOUNTER — Other Ambulatory Visit (INDEPENDENT_AMBULATORY_CARE_PROVIDER_SITE_OTHER): Admitting: Pharmacist

## 2023-09-21 VITALS — Wt 272.0 lb

## 2023-09-21 DIAGNOSIS — E119 Type 2 diabetes mellitus without complications: Secondary | ICD-10-CM

## 2023-09-21 DIAGNOSIS — Z7984 Long term (current) use of oral hypoglycemic drugs: Secondary | ICD-10-CM

## 2023-09-21 MED ORDER — ONETOUCH VERIO VI STRP: ORAL_STRIP | 5 refills | Status: AC

## 2023-09-21 NOTE — Telephone Encounter (Signed)
 Can you please mail Jardiance  PAP to patient (not a Farxiga  candidate) Made patient aware that Jardiance  is a more stringent/difficult application process He appreciates us  trying! Samples left up front of Jardiance  25mg  for patient to pick up! Patient made aware of all of the above  Appreciate assistance, Concha Deed

## 2023-09-21 NOTE — Progress Notes (Signed)
 09/21/2023 Name: Matthew Pennington MRN: [id number] DOB: 22-Nov-1965  Chief Complaint  Patient presents with   Diabetes    Matthew Pennington is a 58 y.o. year old male who presented for a telephone visit.I connected with  [name] on 09/21/23 by telephone and verified that I am speaking with the correct person using two identifiers. I discussed the limitations of evaluation and management by telemedicine. The patient expressed understanding and agreed to proceed.  Patient was located in her home and PharmD in PCP office during this visit.   They were referred to the pharmacist by their PCP for assistance in managing diabetes and medication access.    Subjective:  Care Team: Primary Care Provider: Yevette Hem, FNP    Medication Access/Adherence  Current Pharmacy:  [facility] [address], [place] - [address] [place] [address] Phone: [phone] Fax: [phone]  [facility] - [place], [place] - [address] [place] [address] Phone: [phone] Fax: [phone]  [facility] - [place], [place] - [address] [place] [address] Phone: [phone] Fax: [phone]  [facility] [address], [place] - [address] [place] [address] [place] [address] [place] [address] Phone: [phone] Fax: [phone]  [facility] - [place], [place] - [address] [place] [address] Phone: [phone] Fax: [phone]   Patient reports affordability concerns with their medications: Yes  Patient reports access/transportation concerns to their pharmacy: No  Patient reports adherence concerns with their medications:  Yes  due to cost  Diabetes:  Current medications: jardiance  25mg , metformin  Medications tried in the past: ozempic  (1 month), farxiga  (cannot tolerate, allergy list updated)  Current glucose readings: FBG down to 170-180  (was 300-400) Using one touch verio meter; out of test strips/has lancets [facility] so to [facility]  Patient denies hypoglycemic s/sx including dizziness, shakiness, sweating. Patient denies hyperglycemic symptoms including polyuria, polydipsia, polyphagia, nocturia, neuropathy, blurred vision.  Current meal patterns:  Patient reports decreased intake or sugary foods/snacks He is making positive lifestyle changes Discussed meal planning options and Plate method for healthy eating Avoid sugary drinks and desserts Incorporate balanced protein, non starchy veggies, 1 serving of carbohydrate with each meal Increase water intake Increase physical activity as able  Current physical activity: encouraged as able  Current medication access support: medicare   Objective:  Lab Results  Component Value Date   HGBA1C 9.9 (H) 07/28/2023    Lab Results  Component Value Date   CREATININE 0.98 07/28/2023   BUN 11 07/28/2023   NA 137 07/28/2023   K 4.7 07/28/2023   CL 99 07/28/2023   CO2 21 07/28/2023    Lab Results  Component Value Date   CHOL 217 (H) 11/03/2022   HDL 30 (L) 11/03/2022   LDLCALC 129 (H) 11/03/2022   TRIG 321 (H) 11/03/2022   CHOLHDL 7.2 (H) 11/03/2022    Medications Reviewed Today     Reviewed by Delilah Fend, [facility] (Pharmacist) on 09/21/23 at 1557  Med List Status: <None>   Medication Order Taking? Sig Documenting Provider Last Dose Status Informant  baclofen  (LIORESAL ) 10 MG tablet [id number] No Take 1 tablet (10 mg total) by mouth 3 (three) times daily. Angelia Kelp, PA-C Taking Active    Discontinued 09/21/23 1557 (Completed Course)   Blood Glucose Monitoring Suppl (ONETOUCH VERIO REFLECT) w/Device  KIT [id number] No  [provider] Taking Active   diclofenac  Sodium (VOLTAREN ) 1 % GEL [id number] No Apply 4 g topically 4 (four) times daily. St Verma Gobble, Adell Hones, NP Taking Active   empagliflozin  (JARDIANCE ) 25 MG TABS tablet  [id number]  Take 1 tablet (25 mg total) by mouth daily before breakfast. Tommas Fragmin A, FNP  Active   enalapril  (VASOTEC ) 20 MG tablet [id number] No Take 1 tablet by mouth once daily Tommas Fragmin A, FNP Taking Active   ezetimibe  (ZETIA ) 10 MG tablet [id number] No Take 1 tablet by mouth once daily Tommas Fragmin A, FNP Taking Active   gabapentin  (NEURONTIN ) 300 MG capsule [id number] No TAKE 1 CAPSULE BY MOUTH THREE TIMES DAILY Yevette Hem, FNP Taking Active   glucose blood (ONETOUCH VERIO) test strip [id number]  Use as instructed to test daily; DX:e11.65 Tommas Fragmin A, FNP  Active   ibuprofen  (ADVIL ) 800 MG tablet [id number]  Take 1 tablet (800 mg total) by mouth every 8 (eight) hours as needed. Yevette Hem, FNP  Active   Lancet Devices (SIMPLE DIAGNOSTICS LANCING DEV) MISC [id number] No Apply topically. [provider] Taking Active   lidocaine  (LIDODERM ) 5 % [id number] No Place 1 patch onto the skin daily. Remove & Discard patch within 12 hours or as directed by MD Kommor, Alyse July, MD Taking Active   metFORMIN  (GLUCOPHAGE ) 500 MG tablet [id number] No TAKE 2 TABLETS BY MOUTH TWICE DAILY WITH A MEAL Hawks, Christy A, FNP Taking Active   oxcarbazepine  (TRILEPTAL ) 600 MG tablet [id number] No Take 1 tablet (600 mg total) by mouth 3 (three) times daily. Yevette Hem, FNP Taking Active   oxyCODONE -acetaminophen  (PERCOCET/ROXICET) 5-325 MG tablet [id number] No Take 1 tablet by mouth every 6 (six) hours as needed for severe pain. Cheyenne Cotta, MD Taking Active   pantoprazole  (PROTONIX ) 40 MG tablet [id number] No Take 1 tablet (40 mg total) by mouth daily. Tommas Fragmin A, FNP Taking Active   rOPINIRole  (REQUIP ) 0.25 MG tablet [id number] No TAKE 1 TO 2 TABLETS BY MOUTH AT BEDTIME FOR  RESTLESS  LEGS Hawks, Christy A, FNP Taking Active   traMADol  (ULTRAM ) 50 MG tablet [id number]  Take 1 tablet (50 mg total) by mouth daily. Yevette Hem, FNP  Active               Assessment/Plan:    Diabetes: - Currently uncontrolled, but improved (FBG down 50%)  Will likely need to add GLP1 (if tolerated) to achieve FBG<130 and goal A1C~7% - Reviewed long term cardiovascular and renal outcomes of uncontrolled blood sugar - Reviewed goal A1c, goal fasting, and goal 2 hour post prandial glucose - Reviewed dietary modifications including FOLLOWING A HEART HEALTHY DIET/HEALTHY PLATE METHOD - Reviewed lifestyle modifications including: increased physical activity  - Recommend to : Continue Jardiance  and metformin  Jardiance  samples left up front  Address lipids at f/u; potential PCSK9 candidate due statin myopathy One touch verio test strips sent to [facility] per patient request - Patient denies personal or family history of multiple endocrine neoplasia type 2, medullary thyroid  cancer; personal history of pancreatitis or gallbladder disease. - Recommend to check glucose daily (fasting) or if symptomatic  Respectfully declines CGM - Meets financial criteria for Jardiance  patient assistance program through [facility] cares. Will collaborate with provider, CPhT, and patient to pursue assistance.    Follow Up Plan: 3 weeks  30 min of patient care was provided to the patient during this visit time.  No charge visit   Concha Deed  Job Mulch, PharmD, BCACP, CPP Clinical Pharmacist, [facility]

## 2023-09-22 ENCOUNTER — Other Ambulatory Visit (HOSPITAL_COMMUNITY): Payer: Self-pay

## 2023-09-22 ENCOUNTER — Telehealth: Payer: Self-pay

## 2023-09-22 NOTE — Progress Notes (Signed)
 Pharmacy Medication Assistance Program Note    09/27/2023  Patient ID: Matthew Pennington, male   DOB: Dec 15, 1965, 58 y.o.   MRN: [id number]     09/22/2023  [employer]  [employer]  [employer] Jardiance   Type of Radiographer, therapeutic Assistance  Date Application Sent to Patient 09/27/2023  Application Items Requested Application;Proof of Income    NEW - mailed to home.

## 2023-10-09 NOTE — Progress Notes (Unsigned)
 10/10/2023 Name: Matthew Pennington MRN: [id number] DOB: Oct 07, 1965  Chief Complaint  Patient presents with   Diabetes   Hyperlipidemia    Matthew Pennington is a 58 y.o. year old male who presented for a telephone visit.   They were referred to the pharmacist by their PCP for assistance in managing diabetes and hyperlipidemia.    Subjective:  Care Team: Primary Care Provider: Yevette Hem, FNP ; Next Scheduled Visit: 11/07/23  Medication Access/Adherence  Current Pharmacy:  [facility] [address] [place] - [address] [place] [address] Phone: [phone] Fax: [phone]  [facility] - [place], [place] - [address] [place] [address] Phone: [phone] Fax: [phone]  [facility] - [place], [place] - [address] [place] [address] Phone: [phone] Fax: [phone]  [facility] [address], [place] - [address] [place] [address] [place] [address] [place] [address] Phone: [phone] Fax: [phone]  [facility] - [place], [place] - [address] [place] [address] Phone: [phone] Fax: [phone]   Patient reports affordability concerns with their medications: Yes - Jardiance  >$400 at the pharmacy, currently working on [facility] PAP Patient reports access/transportation concerns to their pharmacy: No  Patient reports adherence concerns with their medications:  No     Diabetes:  Current medications: Jardiance  25 mg daily, metformin  1000 mg BID Medications tried in the past: Farxiga  (dizziness), Ozempic  (GI upset)  Patient reports he received Jardiance  PAP application in the mail and is working on it. Has noticed increased frequency of urination since starting on Jardiance . Notes he has lost weight recently - remembers weighing himself a couple months ago and was 292 lbs, but is now 274 lbs.  Using  glucometer; testing once daily Fasting BG in AM: 135 this morning, usually 140-150  Patient denies hypoglycemic s/sx including dizziness, shakiness, sweating. Patient denies hyperglycemic symptoms including polyuria, polydipsia, polyphagia, nocturia, neuropathy, blurred vision.  Current meal patterns:  - 2 meals/day  - Breakfast: hard boiled eggs, bacon - Lunch: salad, grilled chicken, salmon - Snacks: pickles, olives, fruit (eating more watermelon and mangoes), sandwich - Drinks:water, unsweet tea, mini diet coke daily  Current physical activity: limited by hip and back pain  Current medication access support: Patient has received [employer] PAP application for Jardiance  and is working on it  Hyperlipidemia/ASCVD Risk Reduction  Current lipid lowering medications: ezetimibe  10 mg daily Medications tried in the past: atorvastatin, rosuvastatin  (muscle pain)  Reports muscle pains with statins in the past. Discussed investigating coverage for non-statin therapies. Patient notes he still has rosuvastatin  5 mg tablets at home and previously was taking daily when he had muscle pains. Notes PCP had recommended taking a 1 tablet a few times a week which he never tried.   ASCVD History: none Family History: sister- heart disease Risk Factors: DM, HLD, HTN  Current physical activity: limited by hip and back pain  The 10-year ASCVD risk score (Arnett DK, et al., 2019) is: 22.5%   Values used to calculate the score:     Age: 36 years     Sex: Male     Is Non-Hispanic African American: No     Diabetic: Yes     Tobacco smoker: No     Systolic Blood Pressure: 121 mmHg     Is BP treated: Yes  HDL Cholesterol: 30 mg/dL     Total Cholesterol: 217 mg/dL    Objective:  Lab Results  Component Value Date   HGBA1C 9.9 (H) 07/28/2023    Lab Results  Component Value Date   CREATININE 0.98 07/28/2023   BUN 11 07/28/2023   NA 137 07/28/2023   K 4.7 07/28/2023   CL 99 07/28/2023   CO2  21 07/28/2023    Lab Results  Component Value Date   CHOL 217 (H) 11/03/2022   HDL 30 (L) 11/03/2022   LDLCALC 129 (H) 11/03/2022   TRIG 321 (H) 11/03/2022   CHOLHDL 7.2 (H) 11/03/2022    Medications Reviewed Today     Reviewed by Philmore Bream, RPH (Pharmacist) on 10/10/23 at 1048  Med List Status: <None>   Medication Order Taking? Sig Documenting Provider Last Dose Status Informant  baclofen  (LIORESAL ) 10 MG tablet [id number] Yes Take 1 tablet (10 mg total) by mouth 3 (three) times daily. Angelia Kelp, PA-C Taking Active   Blood Glucose Monitoring Suppl [facility] VERIO REFLECT) w/Device Suzanne Erps [id number]   [provider]  Active   diclofenac  Sodium (VOLTAREN ) 1 % GEL [id number]  Apply 4 g topically 4 (four) times daily. St Verma Gobble, Adell Hones, NP  Active   empagliflozin  (JARDIANCE ) 25 MG TABS tablet [id number] Yes Take 1 tablet (25 mg total) by mouth daily before breakfast. Yevette Hem, FNP Taking Active   enalapril  (VASOTEC ) 20 MG tablet [id number] Yes Take 1 tablet by mouth once daily Tommas Fragmin A, FNP Taking Active   ezetimibe  (ZETIA ) 10 MG tablet [id number] Yes Take 1 tablet by mouth once daily Tommas Fragmin A, FNP Taking Active   gabapentin  (NEURONTIN ) 300 MG capsule [id number] Yes TAKE 1 CAPSULE BY MOUTH THREE TIMES DAILY Yevette Hem, FNP Taking Active   glucose blood (ONETOUCH VERIO) test strip [id number]  Use as instructed to test daily; DX:e11.65 Tommas Fragmin A, FNP  Active   ibuprofen  (ADVIL ) 800 MG tablet [id number] Yes Take 1 tablet (800 mg total) by mouth every 8 (eight) hours as needed. Yevette Hem, FNP Taking Active   Lancet Devices (SIMPLE DIAGNOSTICS LANCING DEV) MISC [id number]  Apply topically. [provider]  Active   lidocaine  (LIDODERM ) 5 % [id number] Yes Place 1 patch onto the skin daily. Remove & Discard patch within 12 hours or as directed by MD Kommor, Alyse July, MD Taking Active   metFORMIN  (GLUCOPHAGE ) 500 MG tablet  [id number] Yes TAKE 2 TABLETS BY MOUTH TWICE DAILY WITH A MEAL Hawks, Christy A, FNP Taking Active   oxcarbazepine  (TRILEPTAL ) 600 MG tablet [id number] Yes Take 1 tablet (600 mg total) by mouth 3 (three) times daily. Yevette Hem, FNP Taking Active   oxyCODONE -acetaminophen  (PERCOCET/ROXICET) 5-325 MG tablet [id number] Yes Take 1 tablet by mouth every 6 (six) hours as needed for severe pain. Cheyenne Cotta, MD Taking Active   pantoprazole  (PROTONIX ) 40 MG tablet [id number] Yes Take 1 tablet (40 mg total) by mouth daily. Tommas Fragmin A, FNP Taking Active   rOPINIRole  (REQUIP ) 0.25 MG tablet [id number] Yes TAKE 1 TO 2 TABLETS BY MOUTH AT BEDTIME FOR  RESTLESS  LEGS Hawks, Christy A, FNP Taking Active   traMADol  (ULTRAM ) 50 MG tablet [id number] Yes Take 1 tablet (50 mg total) by mouth daily. Yevette Hem, FNP Taking Active               Assessment/Plan:   Diabetes: - Currently uncontrolled with last A1C 9.9% on 07/28/23, above goal <7%.  Patient reported home BG has improved since starting on Jardiance , but is still elevated above goal. Previously intolerant of Ozempic  d/t GI upset and will need patient assistance for medication access. Discussed initiation of Rybelsus  and he was amenable to this. Reviewed appropriate administration with water at least 30 minutes before other food/drink or medications. - Reviewed long term cardiovascular and renal outcomes of uncontrolled blood sugar - Reviewed goal A1c, goal fasting, and goal 2 hour post prandial glucose - Reviewed dietary modifications including limiting portion sizes of higher carb foods (watermelon, mangoes). Encouraged incorporation of lower glycemic index fruits (berries). - Reviewed lifestyle modifications including: encouraged physical activity as able although limited by chronic pain - Recommend to start Rybelsus  3 mg once daily. Medication sample placed up front. - Recommend to continue metformin  500 mg - 2 tablets BID (cannot  swallow larger 1000 mg tablets) - Recommend to continue Jardiance  25 mg daily. Medication sample placed up front. - Patient denies personal or family history of multiple endocrine neoplasia type 2, medullary thyroid  cancer; personal history of pancreatitis or gallbladder disease. - Recommend to check fasting blood glucose once daily - Meets financial criteria for Jardiance  patient assistance program through [facility]. Encouraged patient to complete application that he received and return this. Will hold off on initiating Rybelsus  PAP for now to see if he is able to tolerate this. - A1C and UACR due at next PCP visit   Hyperlipidemia/ASCVD Risk Reduction: - Currently uncontrolled with last LDL 129 mg/dL on 09/01/99 and triglycerides elevated at 321 mg/dL. High risk primary prevention with risk factors DM, HTN, obesity. ASCVD risk score 22.5%. He has a history of intolerance to atorvastatin and rosuvastatin  due to muscle pain. Patient would like to try taking rosuvastatin  5 mg 2-3 times per week first before investigating coverage for non-statin therapies which is reasonable.  - Reviewed long term complications of uncontrolled cholesterol - Reviewed dietary recommendations as above - Reviewed lifestyle recommendations including incorporating physical activity as able although limited by chronic pain - Recommend to start taking rosuvastatin  5 mg 2-3 times per week and let us  know if he experiences muscle pain with this dosing. May need to investigate coverage for non-statin therapies in the future. - Lipid panel due at next PCP visit   Follow Up Plan: PCP on 11/07/23 and PharmD on 12/05/23   Georga Killings, PharmD PGY-1 Pharmacy Resident  Marvell Slider, PharmD, BCACP, CPP Clinical Pharmacist, [facility]

## 2023-10-10 ENCOUNTER — Other Ambulatory Visit (INDEPENDENT_AMBULATORY_CARE_PROVIDER_SITE_OTHER): Admitting: Pharmacist

## 2023-10-10 DIAGNOSIS — E1169 Type 2 diabetes mellitus with other specified complication: Secondary | ICD-10-CM

## 2023-10-10 DIAGNOSIS — Z7984 Long term (current) use of oral hypoglycemic drugs: Secondary | ICD-10-CM

## 2023-10-10 DIAGNOSIS — E785 Hyperlipidemia, unspecified: Secondary | ICD-10-CM

## 2023-10-10 DIAGNOSIS — E119 Type 2 diabetes mellitus without complications: Secondary | ICD-10-CM

## 2023-10-10 MED ORDER — RYBELSUS 3 MG PO TABS: 3.0000 mg | ORAL_TABLET | Freq: Every day | ORAL | Status: DC

## 2023-10-10 MED ORDER — ROSUVASTATIN CALCIUM 5 MG PO TABS: 5.0000 mg | ORAL_TABLET | ORAL | 5 refills | Status: DC

## 2023-10-18 ENCOUNTER — Encounter: Payer: Self-pay | Admitting: Family Medicine

## 2023-10-19 ENCOUNTER — Other Ambulatory Visit: Payer: Self-pay | Admitting: Family

## 2023-10-19 DIAGNOSIS — E1159 Type 2 diabetes mellitus with other circulatory complications: Secondary | ICD-10-CM

## 2023-10-23 ENCOUNTER — Other Ambulatory Visit: Payer: Self-pay | Admitting: Family

## 2023-10-23 DIAGNOSIS — G8929 Other chronic pain: Secondary | ICD-10-CM

## 2023-10-25 ENCOUNTER — Other Ambulatory Visit: Payer: Self-pay | Admitting: *Deleted

## 2023-10-25 DIAGNOSIS — E1159 Type 2 diabetes mellitus with other circulatory complications: Secondary | ICD-10-CM

## 2023-10-25 DIAGNOSIS — E1169 Type 2 diabetes mellitus with other specified complication: Secondary | ICD-10-CM

## 2023-10-25 MED ORDER — METFORMIN HCL 500 MG PO TABS: ORAL_TABLET | ORAL | 0 refills | Status: DC

## 2023-10-26 ENCOUNTER — Other Ambulatory Visit: Payer: Self-pay | Admitting: Family

## 2023-10-26 DIAGNOSIS — G500 Trigeminal neuralgia: Secondary | ICD-10-CM

## 2023-10-26 DIAGNOSIS — E1169 Type 2 diabetes mellitus with other specified complication: Secondary | ICD-10-CM

## 2023-10-27 ENCOUNTER — Other Ambulatory Visit: Payer: Self-pay | Admitting: *Deleted

## 2023-10-27 DIAGNOSIS — E1169 Type 2 diabetes mellitus with other specified complication: Secondary | ICD-10-CM

## 2023-10-27 MED ORDER — EZETIMIBE 10 MG PO TABS: 10.0000 mg | ORAL_TABLET | Freq: Every day | ORAL | 0 refills | Status: DC

## 2023-10-31 ENCOUNTER — Telehealth: Payer: Self-pay | Admitting: Family

## 2023-10-31 NOTE — Telephone Encounter (Signed)
 Patient wants to know if we have samples of Jardiance  that we can give him.  Says he filled out paperwork for patient asssitance but lost the paperwork and needs new paperwork to fill out because he can't afford Jardiance .   Please advise.

## 2023-10-31 NOTE — Telephone Encounter (Signed)
Samples given.  

## 2023-11-07 ENCOUNTER — Ambulatory Visit: Payer: Medicare Other | Admitting: Family

## 2023-11-07 ENCOUNTER — Encounter: Payer: Self-pay | Admitting: Family

## 2023-11-07 VITALS — BP 105/67 | HR 87 | Temp 97.5°F | Ht 73.0 in | Wt 277.4 lb

## 2023-11-07 DIAGNOSIS — M545 Low back pain, unspecified: Secondary | ICD-10-CM

## 2023-11-07 DIAGNOSIS — G4762 Sleep related leg cramps: Secondary | ICD-10-CM

## 2023-11-07 DIAGNOSIS — G500 Trigeminal neuralgia: Secondary | ICD-10-CM

## 2023-11-07 DIAGNOSIS — R5383 Other fatigue: Secondary | ICD-10-CM | POA: Diagnosis not present

## 2023-11-07 DIAGNOSIS — E1159 Type 2 diabetes mellitus with other circulatory complications: Secondary | ICD-10-CM | POA: Diagnosis not present

## 2023-11-07 DIAGNOSIS — Z7984 Long term (current) use of oral hypoglycemic drugs: Secondary | ICD-10-CM | POA: Diagnosis not present

## 2023-11-07 DIAGNOSIS — I152 Hypertension secondary to endocrine disorders: Secondary | ICD-10-CM

## 2023-11-07 DIAGNOSIS — Z0001 Encounter for general adult medical examination with abnormal findings: Secondary | ICD-10-CM

## 2023-11-07 DIAGNOSIS — E1169 Type 2 diabetes mellitus with other specified complication: Secondary | ICD-10-CM

## 2023-11-07 DIAGNOSIS — G8929 Other chronic pain: Secondary | ICD-10-CM

## 2023-11-07 DIAGNOSIS — K219 Gastro-esophageal reflux disease without esophagitis: Secondary | ICD-10-CM

## 2023-11-07 DIAGNOSIS — E785 Hyperlipidemia, unspecified: Secondary | ICD-10-CM

## 2023-11-07 DIAGNOSIS — Z0000 Encounter for general adult medical examination without abnormal findings: Secondary | ICD-10-CM | POA: Diagnosis not present

## 2023-11-07 DIAGNOSIS — M797 Fibromyalgia: Secondary | ICD-10-CM

## 2023-11-07 DIAGNOSIS — E119 Type 2 diabetes mellitus without complications: Secondary | ICD-10-CM

## 2023-11-07 LAB — BAYER DCA HB A1C WAIVED: HB A1C (BAYER DCA - WAIVED): 9 % — ABNORMAL HIGH (ref 4.8–5.6)

## 2023-11-07 LAB — LIPID PANEL

## 2023-11-07 MED ORDER — ENALAPRIL MALEATE 20 MG PO TABS: 20.0000 mg | ORAL_TABLET | Freq: Every day | ORAL | 2 refills | Status: DC

## 2023-11-07 MED ORDER — ROPINIROLE HCL 0.25 MG PO TABS: ORAL_TABLET | ORAL | 1 refills | Status: DC

## 2023-11-07 MED ORDER — IBUPROFEN 800 MG PO TABS: 800.0000 mg | ORAL_TABLET | Freq: Three times a day (TID) | ORAL | 2 refills | Status: DC | PRN

## 2023-11-07 MED ORDER — EZETIMIBE 10 MG PO TABS: 10.0000 mg | ORAL_TABLET | Freq: Every day | ORAL | 2 refills | Status: DC

## 2023-11-07 MED ORDER — RYBELSUS 3 MG PO TABS
3.0000 mg | ORAL_TABLET | Freq: Every day | ORAL | Status: DC
Start: 2023-11-07 — End: 2024-02-08

## 2023-11-07 MED ORDER — OXCARBAZEPINE 600 MG PO TABS: 600.0000 mg | ORAL_TABLET | Freq: Three times a day (TID) | ORAL | 2 refills | Status: AC

## 2023-11-07 MED ORDER — TRAMADOL HCL 50 MG PO TABS: 50.0000 mg | ORAL_TABLET | Freq: Every day | ORAL | 2 refills | Status: DC

## 2023-11-07 MED ORDER — GABAPENTIN 300 MG PO CAPS: 300.0000 mg | ORAL_CAPSULE | Freq: Three times a day (TID) | ORAL | 2 refills | Status: DC

## 2023-11-07 MED ORDER — PANTOPRAZOLE SODIUM 40 MG PO TBEC: 40.0000 mg | DELAYED_RELEASE_TABLET | Freq: Every day | ORAL | 1 refills | Status: DC

## 2023-11-07 MED ORDER — DICLOFENAC SODIUM 1 % EX GEL
4.0000 g | Freq: Four times a day (QID) | CUTANEOUS | 2 refills | Status: AC
Start: 2023-11-07 — End: ?

## 2023-11-07 MED ORDER — ROSUVASTATIN CALCIUM 5 MG PO TABS: 5.0000 mg | ORAL_TABLET | ORAL | 5 refills | Status: DC

## 2023-11-07 MED ORDER — METFORMIN HCL 500 MG PO TABS: ORAL_TABLET | ORAL | 2 refills | Status: DC

## 2023-11-07 NOTE — Progress Notes (Signed)
 Subjective:    Patient ID: Tobyn Osgood, male    DOB: 1965/07/17, 58 y.o.   MRN: [id number]  Chief Complaint  Patient presents with   Annual Exam    Tooth pulled recently. Feet and leg pain. Can't sleep at night.   Pt presents to the office today for CPE and chronic follow up.   He is followed by Neurologists for Trigeminal neuralgia. She has given him oxycodone  as needed for flare ups. He only takes it 3-4 times a year.     He does take Ultram  as needed for chronic back pain. He only takes this 1-2 times a month. He never mixes the two.    He has OSA and uses CPAP nightly.   Complaining fatigue.    He is having right hand weakness and is followed by Ortho.    Reports statins causes muscle pain and can not tolerate.  Hypertension This is a chronic problem. The current episode started more than 1 year ago. The problem has been resolved since onset. The problem is controlled. Associated symptoms include malaise/fatigue. Pertinent negatives include no blurred vision, peripheral edema or shortness of breath. Risk factors for coronary artery disease include obesity, dyslipidemia and male gender. The current treatment provides moderate improvement.  Gastroesophageal Reflux He complains of belching and heartburn. This is a chronic problem. The current episode started more than 1 year ago. The problem occurs occasionally. The symptoms are aggravated by certain foods. Associated symptoms include fatigue. Risk factors include obesity. He has tried a PPI for the symptoms. The treatment provided moderate relief.  Hyperlipidemia This is a chronic problem. The current episode started more than 1 year ago. The problem is uncontrolled. Exacerbating diseases include obesity. Pertinent negatives include no shortness of breath. Current antihyperlipidemic treatment includes diet change, ezetimibe  and statins. The current treatment provides mild improvement of lipids. Risk factors for coronary artery disease  include dyslipidemia, diabetes mellitus, hypertension, male sex and a sedentary lifestyle.  Diabetes He presents for his follow-up diabetic visit. He has type 2 diabetes mellitus. Associated symptoms include fatigue and foot paresthesias. Pertinent negatives for diabetes include no blurred vision. Diabetic complications include peripheral neuropathy. Risk factors for coronary artery disease include dyslipidemia, diabetes mellitus, hypertension, male sex and sedentary lifestyle. He is following a generally unhealthy diet. His overall blood glucose range is 140-180 mg/dl. Eye exam is not current.  Arthritis Presents for follow-up visit. He complains of pain and stiffness. Affected locations include the left knee, right knee, left MCP, right MCP, right shoulder and left shoulder. His pain is at a severity of 6/10. Associated symptoms include fatigue.  Depression        This is a chronic problem.  The current episode started more than 1 year ago.   The onset quality is gradual.   The problem occurs intermittently.  Associated symptoms include decreased concentration, fatigue, helplessness, hopelessness and sad.  Past treatments include SNRIs - Serotonin and norepinephrine reuptake inhibitors.   Current opioids rx- Ultram  50 mg # meds rx- 20 Effectiveness of current meds-stable Adverse reactions from pain meds-constipation Morphine equivalent- 5  Pill count performed-No Last drug screen - 07/28/23 ( high risk q71m, moderate risk q27m, low risk yearly ) Urine drug screen today- No Was the [facility] reviewed- yes  If yes were their any concerning findings? - Does get oxycodone , does not take Ultram  with the oxycodone      08/23/2019   10:44 AM  Opioid Risk   Alcohol 0  Illegal Drugs  0  Rx Drugs 0  Alcohol 0  Illegal Drugs 0  Rx Drugs 0  Age between 16-45 years  1  Psychological Disease 0  Depression 1  Opioid Risk Tool Scoring 2  Opioid Risk Interpretation Low Risk     Pain contract signed  on: 07/28/23    Review of Systems  Constitutional:  Positive for fatigue and malaise/fatigue.  Eyes:  Negative for blurred vision.  Respiratory:  Negative for shortness of breath.   Gastrointestinal:  Positive for heartburn.  Musculoskeletal:  Positive for stiffness.  Psychiatric/Behavioral:  Positive for decreased concentration.   All other systems reviewed and are negative.   Family History  Problem Relation Age of Onset   Diabetes Mother    Hyperlipidemia Mother    Hypertension Mother    Cancer Father    Lung cancer Father    Heart disease Sister    Social History   Socioeconomic History   Marital status: Married    Spouse name: Mayola Specking   Number of children: 3   Years of education: Not on file   Highest education level: Bachelor's degree (e.g., BA, AB, BS)  Occupational History   Not on file  Tobacco Use   Smoking status: Never    Passive exposure: Past   Smokeless tobacco: Never  Vaping Use   Vaping status: Never Used  Substance and Sexual Activity   Alcohol use: No   Drug use: No   Sexual activity: Yes    Comment: getting limitted due to his pain  Other Topics Concern   Not on file  Social History Narrative   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   Social Drivers of Corporate investment banker Strain: Low Risk  (03/16/2023)   Overall Financial Resource Strain (CARDIA)    Difficulty of Paying Living Expenses: Not hard at all  Food Insecurity: No Food Insecurity (03/16/2023)   Hunger Vital Sign    Worried About Running Out of Food in the Last Year: Never true    Ran Out of Food in the Last Year: Never true  Transportation Needs: No Transportation Needs (03/16/2023)   PRAPARE - Administrator, Civil Service (Medical): No    Lack of Transportation (Non-Medical): No  Physical Activity: Inactive (03/16/2023)   Exercise Vital Sign    Days of Exercise per Week: 0 days    Minutes of  Exercise per Session: 0 min  Stress: No Stress Concern Present (03/16/2023)   [employer] of Occupational Health - Occupational Stress Questionnaire    Feeling of Stress : Not at all  Recent Concern: Stress - Stress Concern Present (01/02/2023)   [employer] of Occupational Health - Occupational Stress Questionnaire    Feeling of Stress : Very much  Social Connections: Moderately Integrated (03/16/2023)   Social Connection and Isolation Panel [NHANES]    Frequency of Communication with Friends and Family: More than three times a week    Frequency of Social Gatherings with Friends and Family: More than three times a week    Attends Religious Services: 1 to 4 times per year    Active Member of [employer] or Organizations: No    Attends Banker Meetings: Never    Marital Status: Married       Objective:   Physical Exam Vitals reviewed.  Constitutional:      General: He is not in acute distress.  Appearance: He is well-developed. He is obese.  HENT:     Head: Normocephalic.     Right Ear: Tympanic membrane normal.     Left Ear: Tympanic membrane normal.  Eyes:     General:        Right eye: No discharge.        Left eye: No discharge.     Pupils: Pupils are equal, round, and reactive to light.  Neck:     Thyroid : No thyromegaly.  Cardiovascular:     Rate and Rhythm: Normal rate and regular rhythm.     Heart sounds: Normal heart sounds. No murmur heard. Pulmonary:     Effort: Pulmonary effort is normal. No respiratory distress.     Breath sounds: Normal breath sounds. No wheezing.  Abdominal:     General: Bowel sounds are normal. There is no distension.     Palpations: Abdomen is soft.     Tenderness: There is no abdominal tenderness.  Musculoskeletal:        General: No tenderness.     Cervical back: Normal range of motion and neck supple.     Comments: Pain in lumbar with flexion and extension  Skin:    General: Skin is warm and dry.     Findings:  No erythema or rash.  Neurological:     Mental Status: He is alert and oriented to person, place, and time.     Cranial Nerves: No cranial nerve deficit.     Deep Tendon Reflexes: Reflexes are normal and symmetric.  Psychiatric:        Mood and Affect: Affect is flat.        Behavior: Behavior normal.        Thought Content: Thought content normal.        Judgment: Judgment normal.    Diabetic Foot Exam - Simple   Simple Foot Form Diabetic Foot exam was performed with the following findings: Yes 11/07/2023 10:35 AM  Visual Inspection No deformities, no ulcerations, no other skin breakdown bilaterally: Yes Sensation Testing See comments: Yes Pulse Check Posterior Tibialis and Dorsalis pulse intact bilaterally: Yes Comments Negative monofilament in bilateral toes       BP 105/67   Pulse 87   Temp (!) 97.5 F (36.4 C) (Temporal)   Ht 6\' 1"  (1.854 m)   Wt 277 lb 6.4 oz (125.8 kg)   BMI 36.60 kg/m      Assessment & Plan:   Christyan Reger comes in today with chief complaint of Annual Exam (Tooth pulled recently. Feet and leg pain. Can't sleep at night.)   Diagnosis and orders addressed:  1. Fibromyalgia - diclofenac  Sodium (VOLTAREN ) 1 % GEL; Apply 4 g topically 4 (four) times daily.  Dispense: 350 g; Refill: 2 - CMP14+EGFR  2. Hypertension associated with diabetes (HCC) - enalapril  (VASOTEC ) 20 MG tablet; Take 1 tablet (20 mg total) by mouth daily.  Dispense: 90 tablet; Refill: 2 - CMP14+EGFR  3. Hyperlipidemia associated with type 2 diabetes mellitus (HCC) - ezetimibe  (ZETIA ) 10 MG tablet; Take 1 tablet (10 mg total) by mouth daily.  Dispense: 90 tablet; Refill: 2 - rosuvastatin  (CRESTOR ) 5 MG tablet; Take 1 tablet (5 mg total) by mouth 3 (three) times a week.  Dispense: 12 tablet; Refill: 5 - CMP14+EGFR - Lipid panel  4. Trigeminal neuralgia - gabapentin  (NEURONTIN ) 300 MG capsule; Take 1 capsule (300 mg total) by mouth 3 (three) times daily.  Dispense: 270  capsule; Refill: 2 - oxcarbazepine  (  TRILEPTAL ) 600 MG tablet; Take 1 tablet (600 mg total) by mouth 3 (three) times daily.  Dispense: 270 tablet; Refill: 2 - traMADol  (ULTRAM ) 50 MG tablet; Take 1 tablet (50 mg total) by mouth daily.  Dispense: 20 tablet; Refill: 2 - CMP14+EGFR  5. Chronic bilateral low back pain without sciatica - ibuprofen  (ADVIL ) 800 MG tablet; Take 1 tablet (800 mg total) by mouth every 8 (eight) hours as needed.  Dispense: 180 tablet; Refill: 2 - traMADol  (ULTRAM ) 50 MG tablet; Take 1 tablet (50 mg total) by mouth daily.  Dispense: 20 tablet; Refill: 2 - CMP14+EGFR  6. Type 2 diabetes mellitus with other specified complication, without long-term current use of insulin (HCC) - metFORMIN  (GLUCOPHAGE ) 500 MG tablet; TAKE 2 TABLETS BY MOUTH TWICE DAILY WITH A MEAL  Dispense: 360 tablet; Refill: 2 - Microalbumin / creatinine urine ratio - Bayer DCA Hb A1c Waived - CMP14+EGFR - TSH  7. Gastroesophageal reflux disease, unspecified whether esophagitis present  - pantoprazole  (PROTONIX ) 40 MG tablet; Take 1 tablet (40 mg total) by mouth daily.  Dispense: 90 tablet; Refill: 1 - CMP14+EGFR  8. Leg cramps, sleep related - rOPINIRole  (REQUIP ) 0.25 MG tablet; TAKE 1 TO 2 TABLETS BY MOUTH AT BEDTIME FOR  RESTLESS  LEGS  Dispense: 180 tablet; Refill: 1 - CMP14+EGFR  9. Diabetes mellitus treated with oral medication (HCC) - Semaglutide  (RYBELSUS ) 3 MG TABS; Take 1 tablet (3 mg total) by mouth daily. Medication sample - CMP14+EGFR  10. Annual physical exam (Primary) - Microalbumin / creatinine urine ratio - Bayer DCA Hb A1c Waived - CMP14+EGFR - Lipid panel - TSH - Anemia Profile B  11. Other fatigue - CMP14+EGFR - TSH - Anemia Profile B    Labs pending Continue current medications  Keep specialists follow up Patient reviewed in [place] controlled database, no flags noted. Contract and drug screen are up to date.  Stress management  Health Maintenance reviewed Diet  and exercise encouraged  Follow up plan: 3 months    Tommas Fragmin, FNP

## 2023-11-07 NOTE — Patient Instructions (Signed)

## 2023-11-08 LAB — ANEMIA PROFILE B
Basophils Absolute: 0.1 10*3/uL (ref 0.0–0.2)
Basos: 1 %
EOS (ABSOLUTE): 0.4 10*3/uL (ref 0.0–0.4)
Eos: 5 %
Ferritin: 62 ng/mL (ref 30–400)
Folate: 19.7 ng/mL (ref >3.0)
Hematocrit: 49.5 % (ref 37.5–51.0)
Hemoglobin: 16 g/dL (ref 13.0–17.7)
Immature Grans (Abs): 0 10*3/uL (ref 0.0–0.1)
Immature Granulocytes: 0 %
Iron Saturation: 43 % (ref 15–55)
Iron: 163 ug/dL (ref 38–169)
Lymphocytes Absolute: 3.2 10*3/uL — ABNORMAL HIGH (ref 0.7–3.1)
Lymphs: 38 %
MCH: 29.5 pg (ref 26.6–33.0)
MCHC: 32.3 g/dL (ref 31.5–35.7)
MCV: 91 fL (ref 79–97)
Monocytes Absolute: 0.6 10*3/uL (ref 0.1–0.9)
Monocytes: 7 %
Neutrophils Absolute: 4 10*3/uL (ref 1.4–7.0)
Neutrophils: 49 %
Platelets: 288 10*3/uL (ref 150–450)
RBC: 5.42 x10E6/uL (ref 4.14–5.80)
RDW: 13.2 % (ref 11.6–15.4)
Retic Ct Pct: 2.1 % (ref 0.6–2.6)
Total Iron Binding Capacity: 375 ug/dL (ref 250–450)
UIBC: 212 ug/dL (ref 111–343)
Vitamin B-12: 345 pg/mL (ref 232–1245)
WBC: 8.4 10*3/uL (ref 3.4–10.8)

## 2023-11-08 LAB — MICROALBUMIN / CREATININE URINE RATIO
Creatinine, Urine: 73.3 mg/dL
Microalb/Creat Ratio: 5 mg/g{creat} (ref 0–29)
Microalbumin, Urine: 3.5 ug/mL

## 2023-11-08 LAB — CMP14+EGFR
ALT: 60 IU/L — ABNORMAL HIGH (ref 0–44)
AST: 35 IU/L (ref 0–40)
Albumin: 4.7 g/dL (ref 3.8–4.9)
Alkaline Phosphatase: 83 IU/L (ref 44–121)
BUN/Creatinine Ratio: 13 (ref 9–20)
BUN: 14 mg/dL (ref 6–24)
Bilirubin Total: 0.4 mg/dL (ref 0.0–1.2)
CO2: 20 mmol/L (ref 20–29)
Calcium: 9.9 mg/dL (ref 8.7–10.2)
Chloride: 100 mmol/L (ref 96–106)
Creatinine, Ser: 1.12 mg/dL (ref 0.76–1.27)
Globulin, Total: 2.6 g/dL (ref 1.5–4.5)
Glucose: 161 mg/dL — ABNORMAL HIGH (ref 70–99)
Potassium: 4.7 mmol/L (ref 3.5–5.2)
Sodium: 136 mmol/L (ref 134–144)
Total Protein: 7.3 g/dL (ref 6.0–8.5)
eGFR: 76 mL/min/{1.73_m2} (ref >59)

## 2023-11-08 LAB — LIPID PANEL
Cholesterol, Total: 194 mg/dL (ref 100–199)
HDL: 31 mg/dL — ABNORMAL LOW (ref >39)
LDL CALC COMMENT:: 6.3 ratio — ABNORMAL HIGH (ref 0.0–5.0)
LDL Chol Calc (NIH): 103 mg/dL — ABNORMAL HIGH (ref 0–99)
Triglycerides: 350 mg/dL — ABNORMAL HIGH (ref 0–149)
VLDL Cholesterol Cal: 60 mg/dL — ABNORMAL HIGH (ref 5–40)

## 2023-11-08 LAB — TSH: TSH: 1.17 u[IU]/mL (ref 0.450–4.500)

## 2023-11-09 ENCOUNTER — Ambulatory Visit: Payer: Self-pay | Admitting: Family

## 2023-11-09 LAB — HM DIABETES EYE EXAM

## 2023-11-24 ENCOUNTER — Telehealth: Payer: Self-pay | Admitting: Pharmacy Technician

## 2023-11-24 NOTE — Progress Notes (Signed)
   11/24/2023  Patient ID: Matthew Pennington, male   DOB: [birth year], 58 y.o.   MRN: [id number]  Patient engaged with clinical pharmacist for management of diabetes on 10/10/2023. Outreach by [employer] technician was requested.   Outreached patient to discuss diabetes medication management. Left voicemail for patient to return my call at their convenience.    Adeli Frost, CPhT [facility] Office: [phone] Email: [email or web address]

## 2023-11-27 ENCOUNTER — Telehealth: Payer: Self-pay | Admitting: Pharmacy Technician

## 2023-11-27 NOTE — Progress Notes (Signed)
   11/27/2023  Patient ID: Matthew Pennington, male   DOB: 17-Dec-1965, 58 y.o.   MRN: [id number]  Patient engaged with clinical pharmacist for management of diabetes on 10/10/2023. Outreach by [employer] technician was requested.   Outreached patient to discuss diabetes medication management. Left voicemail for patient to return my call at their convenience. NOTE: Patient has PharmD office visit scheduled 12/05/23  Kate Caddy, CPhT [facility] Office: [phone] Email: [email or web address]

## 2023-11-29 NOTE — Telephone Encounter (Signed)
 Patient came in asking for more samples of Jardiance , 25mg .  We were able to give him 2 sample boxes of this.

## 2023-12-05 ENCOUNTER — Encounter (INDEPENDENT_AMBULATORY_CARE_PROVIDER_SITE_OTHER): Admitting: Pharmacist

## 2023-12-05 DIAGNOSIS — E119 Type 2 diabetes mellitus without complications: Secondary | ICD-10-CM

## 2023-12-05 DIAGNOSIS — Z7985 Long-term (current) use of injectable non-insulin antidiabetic drugs: Secondary | ICD-10-CM

## 2023-12-05 NOTE — Telephone Encounter (Signed)
 Can you follow up on this? I think he has novo PAP pending too.  Thank you!

## 2023-12-05 NOTE — Progress Notes (Signed)
 Patient didn't answer Needs to reschedule This encounter was created in error - please disregard.

## 2023-12-21 ENCOUNTER — Ambulatory Visit (INDEPENDENT_AMBULATORY_CARE_PROVIDER_SITE_OTHER)

## 2023-12-21 ENCOUNTER — Encounter: Payer: Self-pay | Admitting: Family Medicine

## 2023-12-21 ENCOUNTER — Ambulatory Visit (INDEPENDENT_AMBULATORY_CARE_PROVIDER_SITE_OTHER): Admitting: Family Medicine

## 2023-12-21 VITALS — BP 111/72 | HR 78 | Temp 97.4°F | Ht 73.0 in | Wt 278.0 lb

## 2023-12-21 DIAGNOSIS — R051 Acute cough: Secondary | ICD-10-CM

## 2023-12-21 DIAGNOSIS — J301 Allergic rhinitis due to pollen: Secondary | ICD-10-CM | POA: Diagnosis not present

## 2023-12-21 DIAGNOSIS — R918 Other nonspecific abnormal finding of lung field: Secondary | ICD-10-CM | POA: Diagnosis not present

## 2023-12-21 DIAGNOSIS — R059 Cough, unspecified: Secondary | ICD-10-CM | POA: Diagnosis not present

## 2023-12-21 DIAGNOSIS — M5412 Radiculopathy, cervical region: Secondary | ICD-10-CM

## 2023-12-21 MED ORDER — ALBUTEROL SULFATE HFA 108 (90 BASE) MCG/ACT IN AERS: 2.0000 | INHALATION_SPRAY | Freq: Four times a day (QID) | RESPIRATORY_TRACT | 2 refills | Status: AC | PRN

## 2023-12-21 MED ORDER — FLUTICASONE PROPIONATE 50 MCG/ACT NA SUSP: 2.0000 | Freq: Every day | NASAL | 6 refills | Status: DC

## 2023-12-21 MED ORDER — LEVOCETIRIZINE DIHYDROCHLORIDE 5 MG PO TABS
5.0000 mg | ORAL_TABLET | Freq: Every evening | ORAL | 1 refills | Status: AC
Start: 2023-12-21 — End: ?

## 2023-12-21 NOTE — Progress Notes (Signed)
 Subjective:  Patient ID: Matthew Pennington, male    DOB: September 20, 1965, 58 y.o.   MRN: [id number]  Patient Care Team: Lavell Bari LABOR, FNP as PCP - General (Family Medicine) Charls Pearla LABOR, MD (Inactive) as PCP - Cardiology (Cardiology) Cindie Carlin POUR, DO as Consulting Physician (Internal Medicine)   Chief Complaint:  Cough (Dry cough x 1/2 weeks )   HPI: Matthew Pennington is a 58 y.o. male presenting on 12/21/2023 for Cough (Dry cough x 1/2 weeks )   Matthew Pennington is a 58 year old male who presents with a dry cough and congestion.  He has been experiencing a dry cough for about a week and a half. The cough is irritating, with difficulty in expectorating any phlegm. He has been using honey and green tea for symptom relief, with some benefit from the honey. He spends time outdoors, which may be contributing to his symptoms. He does not take any allergy medications such as Zyrtec , Allegra, or Claritin.  He reports intermittent chest tightness or inability to take a deep breath at times. No true chest pain, radiation to the jaw, neck, or arm, and no nausea, vomiting, or sweating. He has a history of reflux, which he manages with medication as needed, but does not take daily. He has a history of pneumonia but denies fever or chills.  Additionally, he describes an episode of nerve pain in his arm that occurred last week while picking up his dog. He felt an 'electrical shock' sensation radiating down to his finger, causing significant pain. This has happened twice, and he has a history of tendonitis in the same arm.          Relevant past medical, surgical, family, and social history reviewed and updated as indicated.  Allergies and medications reviewed and updated. Data reviewed: Chart in Epic.   Past Medical History:  Diagnosis Date   Diabetes mellitus without complication (HCC)    H/O echocardiogram 09/2017   normal EF   Headache    Hyperlipidemia    Hypertension    Light  headedness    OSA on CPAP    Palpitations    Tingling    toes   Trigeminal neuralgia     Past Surgical History:  Procedure Laterality Date   NO PAST SURGERIES      Social History   Socioeconomic History   Marital status: Married    Spouse name: Tracie   Number of children: 3   Years of education: Not on file   Highest education level: Bachelor's degree (e.g., BA, AB, BS)  Occupational History   Not on file  Tobacco Use   Smoking status: Never    Passive exposure: Past   Smokeless tobacco: Never  Vaping Use   Vaping status: Never Used  Substance and Sexual Activity   Alcohol use: No   Drug use: No   Sexual activity: Yes    Comment: getting limitted due to his pain  Other Topics Concern   Not on file  Social History Narrative   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   Social Drivers of Corporate investment banker Strain: Low Risk  (03/16/2023)   Overall Financial Resource Strain (CARDIA)    Difficulty of Paying Living Expenses: Not hard at all  Food Insecurity: No Food Insecurity (03/16/2023)   Hunger Vital Sign    Worried About Running Out of Food  in the Last Year: Never true    Ran Out of Food in the Last Year: Never true  Transportation Needs: No Transportation Needs (03/16/2023)   PRAPARE - Administrator, Civil Service (Medical): No    Lack of Transportation (Non-Medical): No  Physical Activity: Inactive (03/16/2023)   Exercise Vital Sign    Days of Exercise per Week: 0 days    Minutes of Exercise per Session: 0 min  Stress: No Stress Concern Present (03/16/2023)   [employer] of Occupational Health - Occupational Stress Questionnaire    Feeling of Stress : Not at all  Recent Concern: Stress - Stress Concern Present (01/02/2023)   [employer] of Occupational Health - Occupational Stress Questionnaire    Feeling of Stress : Very much  Social Connections: Moderately  Integrated (03/16/2023)   Social Connection and Isolation Panel    Frequency of Communication with Friends and Family: More than three times a week    Frequency of Social Gatherings with Friends and Family: More than three times a week    Attends Religious Services: 1 to 4 times per year    Active Member of [employer] or Organizations: No    Attends Banker Meetings: Never    Marital Status: Married  Catering manager Violence: Not At Risk (03/16/2023)   Humiliation, Afraid, Rape, and Kick questionnaire    Fear of Current or Ex-Partner: No    Emotionally Abused: No    Physically Abused: No    Sexually Abused: No    Outpatient Encounter Medications as of 12/21/2023  Medication Sig   albuterol  (VENTOLIN  HFA) 108 (90 Base) MCG/ACT inhaler Inhale 2 puffs into the lungs every 6 (six) hours as needed (cough).   baclofen  (LIORESAL ) 10 MG tablet Take 1 tablet (10 mg total) by mouth 3 (three) times daily.   Blood Glucose Monitoring Suppl (ONETOUCH VERIO REFLECT) w/Device KIT    diclofenac  Sodium (VOLTAREN ) 1 % GEL Apply 4 g topically 4 (four) times daily.   empagliflozin  (JARDIANCE ) 25 MG TABS tablet Take 1 tablet (25 mg total) by mouth daily before breakfast.   enalapril  (VASOTEC ) 20 MG tablet Take 1 tablet (20 mg total) by mouth daily.   ezetimibe  (ZETIA ) 10 MG tablet Take 1 tablet (10 mg total) by mouth daily.   fluticasone  (FLONASE ) 50 MCG/ACT nasal spray Place 2 sprays into both nostrils daily.   gabapentin  (NEURONTIN ) 300 MG capsule Take 1 capsule (300 mg total) by mouth 3 (three) times daily.   glucose blood (ONETOUCH VERIO) test strip Use as instructed to test daily; DX:e11.65   ibuprofen  (ADVIL ) 800 MG tablet Take 1 tablet (800 mg total) by mouth every 8 (eight) hours as needed.   Lancet Devices (SIMPLE DIAGNOSTICS LANCING DEV) MISC Apply topically.   levocetirizine (XYZAL ) 5 MG tablet Take 1 tablet (5 mg total) by mouth every evening.   lidocaine  (LIDODERM ) 5 % Place 1 patch  onto the skin daily. Remove & Discard patch within 12 hours or as directed by MD   metFORMIN  (GLUCOPHAGE ) 500 MG tablet TAKE 2 TABLETS BY MOUTH TWICE DAILY WITH A MEAL   oxcarbazepine  (TRILEPTAL ) 600 MG tablet Take 1 tablet (600 mg total) by mouth 3 (three) times daily.   oxyCODONE -acetaminophen  (PERCOCET/ROXICET) 5-325 MG tablet Take 1 tablet by mouth every 6 (six) hours as needed for severe pain.   pantoprazole  (PROTONIX ) 40 MG tablet Take 1 tablet (40 mg total) by mouth daily.   rOPINIRole  (REQUIP ) 0.25 MG tablet TAKE 1  TO 2 TABLETS BY MOUTH AT BEDTIME FOR  RESTLESS  LEGS   rosuvastatin  (CRESTOR ) 5 MG tablet Take 1 tablet (5 mg total) by mouth 3 (three) times a week.   Semaglutide  (RYBELSUS ) 3 MG TABS Take 1 tablet (3 mg total) by mouth daily. Medication sample   traMADol  (ULTRAM ) 50 MG tablet Take 1 tablet (50 mg total) by mouth daily.   No facility-administered encounter medications on file as of 12/21/2023.    Allergies  Allergen Reactions   Farxiga  [Dapagliflozin ]     Made pt feel funny; dizzy    Morphine And Codeine Other (See Comments)    Makes me go Crazy   Ozempic  (0.25 Or 0.5 Mg-Dose) [Semaglutide (0.25 Or 0.5mg -Dos)] Other (See Comments)    GI upset   Statins Other (See Comments)    Joint/mucles pains    Pertinent ROS per HPI, otherwise unremarkable      Objective:  BP 111/72   Pulse 78   Temp (!) 97.4 F (36.3 C)   Ht 6' 1 (1.854 m)   Wt 278 lb (126.1 kg)   SpO2 96%   BMI 36.68 kg/m    Wt Readings from Last 3 Encounters:  12/21/23 278 lb (126.1 kg)  11/07/23 277 lb 6.4 oz (125.8 kg)  09/21/23 272 lb (123.4 kg)    Physical Exam Vitals and nursing note reviewed.  Constitutional:      General: He is not in acute distress.    Appearance: Normal appearance. He is obese. He is not ill-appearing, toxic-appearing or diaphoretic.  HENT:     Head: Normocephalic and atraumatic.     Right Ear: A middle ear effusion is present. Tympanic membrane is not  erythematous.     Left Ear: A middle ear effusion is present. Tympanic membrane is not erythematous.     Nose: Congestion present.     Mouth/Throat:     Mouth: Mucous membranes are moist.     Pharynx: Postnasal drip present. No pharyngeal swelling, oropharyngeal exudate, posterior oropharyngeal erythema or uvula swelling.     Comments: Cobblestoning to posterior oropharynx Eyes:     Conjunctiva/sclera: Conjunctivae normal.     Pupils: Pupils are equal, round, and reactive to light.  Cardiovascular:     Rate and Rhythm: Normal rate and regular rhythm.     Heart sounds: Normal heart sounds.  Pulmonary:     Effort: Pulmonary effort is normal.     Breath sounds: Normal breath sounds. No wheezing or rhonchi.  Musculoskeletal:     Cervical back: Normal range of motion and neck supple. No rigidity or tenderness.     Right lower leg: No edema.     Left lower leg: No edema.  Lymphadenopathy:     Cervical: No cervical adenopathy.  Skin:    General: Skin is warm and dry.     Capillary Refill: Capillary refill takes less than 2 seconds.  Neurological:     General: No focal deficit present.     Mental Status: He is alert and oriented to person, place, and time.     Gait: Gait abnormal (using cane).  Psychiatric:        Mood and Affect: Mood normal.        Behavior: Behavior normal. Behavior is cooperative.        Thought Content: Thought content normal.        Judgment: Judgment normal.      Results for orders placed or performed in visit on 11/09/23  HM DIABETES  EYE EXAM   Collection Time: 11/09/23  8:54 AM  Result Value Ref Range   HM Diabetic Eye Exam No Retinopathy No Retinopathy       Pertinent labs & imaging results that were available during my care of the patient were reviewed by me and considered in my medical decision making.  Assessment & Plan:  Johnwilliam was seen today for cough.  Diagnoses and all orders for this visit:  Acute cough -     DG Chest 2 View; Future -      levocetirizine (XYZAL ) 5 MG tablet; Take 1 tablet (5 mg total) by mouth every evening. -     fluticasone  (FLONASE ) 50 MCG/ACT nasal spray; Place 2 sprays into both nostrils daily. -     albuterol  (VENTOLIN  HFA) 108 (90 Base) MCG/ACT inhaler; Inhale 2 puffs into the lungs every 6 (six) hours as needed (cough).  Seasonal allergic rhinitis due to pollen -     levocetirizine (XYZAL ) 5 MG tablet; Take 1 tablet (5 mg total) by mouth every evening. -     fluticasone  (FLONASE ) 50 MCG/ACT nasal spray; Place 2 sprays into both nostrils daily. -     albuterol  (VENTOLIN  HFA) 108 (90 Base) MCG/ACT inhaler; Inhale 2 puffs into the lungs every 6 (six) hours as needed (cough).  Cervical radiculopathy Report new or worsening symptoms.     Allergic Rhinitis Symptoms consistent with allergic rhinitis, including dry cough, postnasal drip, and cobblestoning in the throat, likely exacerbated by environmental allergens such as grass. No signs of pneumonia or other lower respiratory infections. Symptoms have been present for about a week and a half. - Prescribe Xyzal  to be taken every night. - Prescribe Flonase  to be used every morning for at least two weeks. - Advise to continue using local honey with a wooden or plastic spoon to avoid killing spores. - Order chest X-ray to rule out pneumonia. - Instruct to report any worsening or new symptoms.  Cervical Radiculopathy Intermittent shooting pain down the arm, likely due to nerve impingement in the neck or shoulder area. Symptoms suggestive of radiculopathy, possibly related to previous tendonitis in the arm. The pain is described as an electrical shock sensation, indicating nerve involvement.          Continue all other maintenance medications.  Follow up plan: Return if symptoms worsen or fail to improve.   Continue healthy lifestyle choices, including diet (rich in fruits, vegetables, and lean proteins, and low in salt and simple carbohydrates) and  exercise (at least 30 minutes of moderate physical activity daily).  Educational handout given for allergic rhinitis   The above assessment and management plan was discussed with the patient. The patient verbalized understanding of and has agreed to the management plan. Patient is aware to call the clinic if they develop any new symptoms or if symptoms persist or worsen. Patient is aware when to return to the clinic for a follow-up visit. Patient educated on when it is appropriate to go to the emergency department.   Rosaline Bruns, FNP-C [facility] [phone]

## 2023-12-25 ENCOUNTER — Telehealth: Payer: Self-pay | Admitting: Family Medicine

## 2023-12-25 ENCOUNTER — Ambulatory Visit: Payer: Self-pay

## 2023-12-25 NOTE — Telephone Encounter (Signed)
 FYI Only or Action Required?: FYI only for provider.  Patient was last seen in primary care on 12/21/2023 by Severa Rock HERO, FNP.  Called Nurse Triage reporting Muscle Pain.  Symptoms began several weeks ago.  Interventions attempted: OTC medications: Motrin , Tylenol .  Symptoms are: whole body/muscle aches moderate, bilateral hand weakness, knots in shoulders, urinary frequency gradually worsening.  Triage Disposition: See PCP When Office is Open (Within 3 Days)  Patient/caregiver understands and will follow disposition?: Yes          Copied from CRM [id number]. Topic: Clinical - Red Word Triage >> Dec 25, 2023 11:59 AM Matthew Pennington wrote: Matthew Pennington that prompted transfer to Nurse Triage: Patient is having severe muscle pain. It is causing him to be bed bound. Pain is 8/10. Reason for Disposition  [1] MODERATE pain (e.g., interferes with normal activities) AND [2] present > 3 days  Answer Assessment - Initial Assessment Questions 1. ONSET: When did the muscle aches or body pains start?      He states he has always had back pain and issues in hands. He states the last couple of weeks there have been shooting pains, achy pain in arms and legs. He states it feels like he has done a hard work out. He states some days are better than others, he states some days he has to lie in bed.  2. LOCATION: What part of your body is hurting? (e.g., entire body, arms, legs)      Entire body, he states it is every muscle in his body. Chest (muscle and bilateral), arms, legs, back.  3. SEVERITY: How bad is the pain? (Scale 1-10; or mild, moderate, severe)     Soreness, aches. 5/10. He states he is out of the bed today and was able to go out with his wife for lunch.  4. CAUSE: What do you think is causing the pains?     Patient states he was diagnosed with fibromyalgia in the past and he is not sure if this is related.  5. FEVER: Do you have a fever? If Yes, ask: What is your  temperature, how was it measured, and  when did it start?      No.  6. OTHER SYMPTOMS: Do you have any other symptoms? (e.g., chest pain, cold or flu symptoms, rash, weakness, weight loss)     Bilateral hand weakness, knots in shoulders, urinary frequency (he thinks it is a side effect from Jardiance ). Patient denies recent tick bite, dark cola or red colored urine.   7. PREGNANCY: Is there any chance you are pregnant? When was your last menstrual period?     N/A.  8. TRAVEL: Have you traveled out of the country in the last month? (e.g., exposures, travel history)     No.  Protocols used: Muscle Aches and Body Pain-A-AH

## 2023-12-25 NOTE — Telephone Encounter (Signed)
Noted  -LS

## 2023-12-25 NOTE — Telephone Encounter (Signed)
 Pt notified that it can take up to 2 weeks but we will notify him as soon as they result.

## 2023-12-25 NOTE — Telephone Encounter (Signed)
 Copied from CRM [id number]. Topic: Clinical - Lab/Test Results >> Dec 25, 2023 11:58 AM Corin V wrote: Reason for CRM: Patient wife Daine called to follow up on the chest x-ray done 7/24 and no results in chart yet. Patient is very concerned about results not being back yet.

## 2023-12-27 ENCOUNTER — Ambulatory Visit: Admitting: Family Medicine

## 2023-12-29 ENCOUNTER — Ambulatory Visit: Payer: Self-pay | Admitting: Family Medicine

## 2023-12-29 NOTE — Telephone Encounter (Signed)
 Patient NTBS to discuss issues

## 2024-01-05 NOTE — Telephone Encounter (Signed)
 Patient states he needs new applications sent Need rybelsus  7mg  daily and jardiance   Please mail to patient Okay to send me PCP portion Thanks!

## 2024-01-09 ENCOUNTER — Telehealth: Payer: Self-pay

## 2024-01-09 ENCOUNTER — Other Ambulatory Visit (HOSPITAL_COMMUNITY): Payer: Self-pay

## 2024-01-09 NOTE — Progress Notes (Deleted)
 Pharmacy Medication Assistance Program Note    02/06/2024  Patient ID: Matthew Pennington, male  DOB: 1965-06-12, 58 y.o.  MRN:  [id number]     01/09/2024  Outreach Medication Two  Manufacturer Medication Two [employer]  [employer] Jardiance   Type of Radiographer, therapeutic Assistance  Date Application Sent to Patient 01/09/2024  Application Items Requested Application;Proof of Income  Date Application Received From Patient 02/02/2024  Application Items Received From Patient Application     NEW   IN PROCESS OF SENDING PROVIDER PAGES TO PCP

## 2024-01-09 NOTE — Progress Notes (Addendum)
 Pharmacy Medication Assistance Program Note    02/06/2024  Patient ID: Matthew Pennington, male   DOB: January 04, 1966, 58 y.o.   MRN: [id number]     09/22/2023  Outreach Medication One  Manufacturer Medication One [employer]  [employer] Drugs Jardiance   Type of Radiographer, therapeutic Assistance  Date Application Sent to Patient 01/09/2024  Application Items Requested Application;Proof of Income  Date Application Received From Patient 02/02/2024  Application Items Received From Patient Application     NEW   In process of sending provider pages to PCP

## 2024-01-17 ENCOUNTER — Ambulatory Visit: Payer: Self-pay | Admitting: *Deleted

## 2024-01-17 NOTE — Telephone Encounter (Signed)
 Apt scheduled.

## 2024-01-17 NOTE — Telephone Encounter (Signed)
 Message from [place] B sent at 01/17/2024  2:09 PM EDT  Summary: Possible shingles   Reason for Triage: patient is having a breakout on his right ankle and not sure if its shingles or not plus he's a really bad diabetic. [phone], spouse is Daine Britain she is the caller but she gave me the patient's number to receive the call back          Call History  Contact Date/Time Type Contact Phone/Fax By  01/17/2024 02:08 PM EDT Phone (Incoming) Bishop,Tracie (Emergency Contact) [phone] BENNIE) Brashear, Suzette E  01/17/2024 02:04 PM EDT Phone (Incoming) Matthew, Pennington (Self) [phone] Kateri Jungling E   Reason for Disposition  [1] Purple or blood-colored LOCALIZED rash AND [2] no fever AND [3] sounds well to triager  (Exception: Bruise from injury or friction.)    Red blistery dots with scabs on right inner ankle.   Itching  Answer Assessment - Initial Assessment Questions 1. APPEARANCE of RASH: What does the rash look like? What color is it? (Note: It is difficult to assess rash color in people with darker-colored skin. When this situation occurs, simply ask the caller to describe what they see.)     I returned his call.    For the last 2 days I have a rash on my right inner ankle.    2. SIZE: How big are the spots? (e.g., inches, cm; or compare to size of pinhead, tip of pen, eraser, pea)      It's red blistery dots.   5 of them. 3. LOCATION: Where is the rash located?     Right ankle 4. ONSET: When did the rash begin?     2 days ago They don't hurt but they itch.   5. FEVER: Do you have a fever? If Yes, ask: What is your temperature, how was it measured, and when did it start?     Not asked 6. CAUSE: What do you think is causing the rash?     I don't know 7. MEDICAL HISTORY: Do you have any medical problems that can cause easy bruising or bleeding? (e.g., leukemia, liver disease, recent chemotherapy)     Not asked 8. MEDICINES: Do you take any medicines which  thin the blood such as: aspirin, heparin, ibuprofen  (NSAIDS), Plavix, or Coumadin?     No 9. OTHER SYMPTOMS: Do you have any other symptoms? (e.g., headache, dizziness, vomiting, sore throat, joint pain, bleeding)     No 10. PREGNANCY: Is there any chance you are pregnant? When was your last menstrual period?       N/A  Protocols used: Rash - Purple Spots or Dots-A-AH FYI Only or Action Required?: FYI only for provider.  Patient was last seen in primary care on 12/21/2023 by Severa Rock HERO, FNP.  Called Nurse Triage reporting Rash. Red blistery rash with scabs on right inner ankle.    Symptoms began several days ago.3 days ago  Interventions attempted: OTC medications: Neosporin cream for the itching.  Symptoms are: gradually worsening.  Triage Disposition: See HCP Within 4 Hours (Or PCP Triage)  Patient/caregiver understands and will follow disposition?: Yes

## 2024-01-18 ENCOUNTER — Encounter: Payer: Self-pay | Admitting: Family

## 2024-01-18 ENCOUNTER — Other Ambulatory Visit: Payer: Self-pay | Admitting: Family

## 2024-01-18 ENCOUNTER — Ambulatory Visit: Admitting: Pharmacist

## 2024-01-18 ENCOUNTER — Telehealth: Admitting: Family

## 2024-01-18 ENCOUNTER — Ambulatory Visit

## 2024-01-18 DIAGNOSIS — E1169 Type 2 diabetes mellitus with other specified complication: Secondary | ICD-10-CM

## 2024-01-18 DIAGNOSIS — S90561A Insect bite (nonvenomous), right ankle, initial encounter: Secondary | ICD-10-CM

## 2024-01-18 DIAGNOSIS — W57XXXA Bitten or stung by nonvenomous insect and other nonvenomous arthropods, initial encounter: Secondary | ICD-10-CM | POA: Diagnosis not present

## 2024-01-18 DIAGNOSIS — E119 Type 2 diabetes mellitus without complications: Secondary | ICD-10-CM

## 2024-01-18 DIAGNOSIS — E1159 Type 2 diabetes mellitus with other circulatory complications: Secondary | ICD-10-CM

## 2024-01-18 MED ORDER — CEPHALEXIN 500 MG PO CAPS: 500.0000 mg | ORAL_CAPSULE | Freq: Three times a day (TID) | ORAL | 0 refills | Status: DC

## 2024-01-18 MED ORDER — MUPIROCIN CALCIUM 2 % EX CREA: 1.0000 | TOPICAL_CREAM | Freq: Two times a day (BID) | CUTANEOUS | 0 refills | Status: DC

## 2024-01-18 NOTE — Progress Notes (Signed)
 Virtual Visit Consent   Matthew Pennington, you are scheduled for a virtual visit with a [facility] provider today. Just as with appointments in the office, your consent must be obtained to participate. Your consent will be active for this visit and any virtual visit you may have with one of our providers in the next 365 days. If you have a MyChart account, a copy of this consent can be sent to you electronically.  As this is a virtual visit, video technology does not allow for your provider to perform a traditional examination. This may limit your provider's ability to fully assess your condition. If your provider identifies any concerns that need to be evaluated in person or the need to arrange testing (such as labs, EKG, etc.), we will make arrangements to do so. Although advances in technology are sophisticated, we cannot ensure that it will always work on either your end or our end. If the connection with a video visit is poor, the visit may have to be switched to a telephone visit. With either a video or telephone visit, we are not always able to ensure that we have a secure connection.  By engaging in this virtual visit, you consent to the provision of healthcare and authorize for your insurance to be billed (if applicable) for the services provided during this visit. Depending on your insurance coverage, you may receive a charge related to this service.  I need to obtain your verbal consent now. Are you willing to proceed with your visit today? Matthew Pennington has provided verbal consent on 01/18/2024 for a virtual visit (video or telephone). Matthew Learn, FNP  Date: 01/18/2024 3:05 PM   Virtual Visit via Video Note   I, Matthew Pennington, connected with  Matthew Pennington  ([id number], June 24, 1965) on 01/18/24 at  3:05 PM EDT by a video-enabled telemedicine application and verified that I am speaking with the correct person using two identifiers.  Location: Patient: Virtual Visit Location Patient:  Home Provider: Virtual Visit Location Provider: Home Office   I discussed the limitations of evaluation and management by telemedicine and the availability of in person appointments. The patient expressed understanding and agreed to proceed.    History of Present Illness: Matthew Pennington is a 58 y.o. who identifies as a male who was assigned male at birth, and is being seen today for rash on right medial ankle. Reports he noticed a week ago. Reports the area has increased redness.    He is an uncontrolled diabetic. His last A1C 9.   HPI: Rash This is a new problem. The current episode started 1 to 4 weeks ago. The problem is unchanged. The affected locations include the right ankle. The rash is characterized by pain. It is unknown if there was an exposure to a precipitant. Pertinent negatives include no cough, diarrhea, fatigue, fever, shortness of breath, sore throat or vomiting. Past treatments include antibiotic cream. The treatment provided mild relief.    Problems:  Patient Active Problem List   Diagnosis Date Noted   Pain of left hip 01/02/2023   Fibromyalgia 07/26/2022   Calcific tendonitis 07/26/2022   Polyarthralgia 06/22/2022   Ureteral calculus, right 04/27/2022   Nephrolithiasis 04/27/2022   Osteoarthritis of carpometacarpal (CMC) joint of thumb 04/12/2022   Statin myopathy 03/03/2022   Right lateral epicondylitis 02/11/2022   Left lateral epicondylitis 02/11/2022   Arthritis of carpometacarpal [facility]) joint of right thumb 06/08/2021   Hepatic steatosis 12/24/2019   Gastroesophageal reflux disease 08/23/2019   Controlled substance  agreement signed 08/23/2019   Moderate recurrent major depression (HCC) 08/23/2019   OSA (obstructive sleep apnea) 12/05/2017   GAD (generalized anxiety disorder) 11/24/2017   Leg cramps, sleep related 11/24/2017   Hypertension associated with diabetes (HCC) 03/29/2017   Diabetes mellitus (HCC) 03/29/2017   Hyperlipidemia associated with type 2  diabetes mellitus (HCC) 03/29/2017   Diverticulosis of large intestine without hemorrhage 03/29/2017   History of prostatitis 03/29/2017   Light headedness    Tingling    Weakness    Trigeminal neuralgia     Allergies:  Allergies  Allergen Reactions   Farxiga  [Dapagliflozin ]     Made pt feel funny; dizzy    Morphine And Codeine Other (See Comments)    Makes me go Crazy   Ozempic  (0.25 Or 0.5 Mg-Dose) [Semaglutide (0.25 Or 0.5mg -Dos)] Other (See Comments)    GI upset   Statins Other (See Comments)    Joint/mucles pains   Medications:  Current Outpatient Medications:    cephALEXin  (KEFLEX ) 500 MG capsule, Take 1 capsule (500 mg total) by mouth 3 (three) times daily., Disp: 21 capsule, Rfl: 0   mupirocin  cream (BACTROBAN ) 2 %, Apply 1 Application topically 2 (two) times daily., Disp: 30 g, Rfl: 0   albuterol  (VENTOLIN  HFA) 108 (90 Base) MCG/ACT inhaler, Inhale 2 puffs into the lungs every 6 (six) hours as needed (cough)., Disp: 8 g, Rfl: 2   baclofen  (LIORESAL ) 10 MG tablet, Take 1 tablet (10 mg total) by mouth 3 (three) times daily., Disp: 30 each, Rfl: 0   Blood Glucose Monitoring Suppl (ONETOUCH VERIO REFLECT) w/Device KIT, , Disp: , Rfl:    diclofenac  Sodium (VOLTAREN ) 1 % GEL, Apply 4 g topically 4 (four) times daily., Disp: 350 g, Rfl: 2   empagliflozin  (JARDIANCE ) 25 MG TABS tablet, Take 1 tablet (25 mg total) by mouth daily before breakfast., Disp: 90 tablet, Rfl: 2   enalapril  (VASOTEC ) 20 MG tablet, Take 1 tablet (20 mg total) by mouth daily., Disp: 90 tablet, Rfl: 2   ezetimibe  (ZETIA ) 10 MG tablet, Take 1 tablet (10 mg total) by mouth daily., Disp: 90 tablet, Rfl: 2   fluticasone  (FLONASE ) 50 MCG/ACT nasal spray, Place 2 sprays into both nostrils daily., Disp: 16 g, Rfl: 6   gabapentin  (NEURONTIN ) 300 MG capsule, Take 1 capsule (300 mg total) by mouth 3 (three) times daily., Disp: 270 capsule, Rfl: 2   glucose blood (ONETOUCH VERIO) test strip, Use as instructed to test  daily; DX:e11.65, Disp: 100 each, Rfl: 5   ibuprofen  (ADVIL ) 800 MG tablet, Take 1 tablet (800 mg total) by mouth every 8 (eight) hours as needed., Disp: 180 tablet, Rfl: 2   Lancet Devices (SIMPLE DIAGNOSTICS LANCING DEV) MISC, Apply topically., Disp: , Rfl:    levocetirizine (XYZAL ) 5 MG tablet, Take 1 tablet (5 mg total) by mouth every evening., Disp: 90 tablet, Rfl: 1   lidocaine  (LIDODERM ) 5 %, Place 1 patch onto the skin daily. Remove & Discard patch within 12 hours or as directed by MD, Disp: 30 patch, Rfl: 0   metFORMIN  (GLUCOPHAGE ) 500 MG tablet, TAKE 2 TABLETS BY MOUTH TWICE DAILY WITH A MEAL, Disp: 360 tablet, Rfl: 2   oxcarbazepine  (TRILEPTAL ) 600 MG tablet, Take 1 tablet (600 mg total) by mouth 3 (three) times daily., Disp: 270 tablet, Rfl: 2   oxyCODONE -acetaminophen  (PERCOCET/ROXICET) 5-325 MG tablet, Take 1 tablet by mouth every 6 (six) hours as needed for severe pain., Disp: 20 tablet, Rfl: 0   pantoprazole  (PROTONIX ) 40  MG tablet, Take 1 tablet (40 mg total) by mouth daily., Disp: 90 tablet, Rfl: 1   rOPINIRole  (REQUIP ) 0.25 MG tablet, TAKE 1 TO 2 TABLETS BY MOUTH AT BEDTIME FOR  RESTLESS  LEGS, Disp: 180 tablet, Rfl: 1   rosuvastatin  (CRESTOR ) 5 MG tablet, Take 1 tablet (5 mg total) by mouth 3 (three) times a week., Disp: 12 tablet, Rfl: 5   Semaglutide  (RYBELSUS ) 3 MG TABS, Take 1 tablet (3 mg total) by mouth daily. Medication sample, Disp: , Rfl:    traMADol  (ULTRAM ) 50 MG tablet, Take 1 tablet (50 mg total) by mouth daily., Disp: 20 tablet, Rfl: 2  Observations/Objective: Patient is well-developed, well-nourished in no acute distress.  Resting comfortably  at home.  Head is normocephalic, atraumatic.  No labored breathing.  Speech is clear and coherent with logical content.  Patient is alert and oriented at baseline.  Erythemas fifty cent piece size circle on right medial ankle  Assessment and Plan: 1. Insect bite of right ankle, initial encounter (Primary) - mupirocin   cream (BACTROBAN ) 2 %; Apply 1 Application topically 2 (two) times daily.  Dispense: 30 g; Refill: 0 - cephALEXin  (KEFLEX ) 500 MG capsule; Take 1 capsule (500 mg total) by mouth 3 (three) times daily.  Dispense: 21 capsule; Refill: 0  2. Type 2 diabetes mellitus with other specified complication, without long-term current use of insulin (HCC)  Start bactroban  BID  Start keflex  TID for 7 days Keep clean and dry  Need to have good control of glucose  Encouraged to mark area, let me know if erythemas spreads past marked area  Follow Up Instructions: I discussed the assessment and treatment plan with the patient. The patient was provided an opportunity to ask questions and all were answered. The patient agreed with the plan and demonstrated an understanding of the instructions.  A copy of instructions were sent to the patient via MyChart unless otherwise noted below.     The patient was advised to call back or seek an in-person evaluation if the symptoms worsen or if the condition fails to improve as anticipated.    Matthew Learn, FNP

## 2024-01-18 NOTE — Progress Notes (Signed)
 01/18/2024 Name: Matthew Pennington MRN: [id number] DOB: 1966/02/05  Chief Complaint  Patient presents with   Diabetes    Matthew Pennington is a 58 y.o. year old male who presented for a telephone visit.  I connected with  Matthew Pennington on 01/18/24 by telephone and verified that I am speaking with the correct person using two identifiers. I discussed the limitations of evaluation and management by telemedicine. The patient expressed understanding and agreed to proceed.  Patient was located in her home and PharmD in PCP office during this visit.   They were referred to the pharmacist by their PCP for assistance in managing diabetes and hyperlipidemia.   Subjective:  Care Team: Primary Care Provider: Lavell Bari LABOR, FNP ; Next Scheduled Visit: 11/07/23  Medication Access/Adherence  Current Pharmacy:  [facility] - [place], [place] - [address] [place] [address] Phone: [phone] Fax: [phone]  [facility] - [place], [place] - [address] [place] [address] Phone: [phone] Fax: [phone]  [facility] [address] [place] - [address] [place] [address] [place] [address] [place] [address] Phone: [phone] Fax: [phone]  [facility] - [place], [place] - [address] [place] [address] Phone: [phone] Fax: [phone]  [facility] [address] [place] - [address] [place] [address] Phone: [phone] Fax: [phone]  Patient reports affordability concerns with their medications: Yes - Jardiance  >$400 at the pharmacy, currently working on [employer] PAP Patient reports access/transportation concerns to their pharmacy: No  Patient reports adherence concerns with their medications:  No    Diabetes:  Current medications: Jardiance  25 mg daily, metformin  1000 mg BID Medications tried in the past: Farxiga   (dizziness), Ozempic  (GI upset), recently stopped Rybelsus   Patient reports he received Jardiance  PAP application in the mail and is working on it. Has noticed increased frequency of urination since starting on Jardiance . Notes he has lost weight recently - remembers weighing himself a couple months ago and was 292 lbs, but is now 274 lbs.  Using glucometer; testing once daily Fasting BG in AM: 135 this morning, usually 140-150  Patient denies hypoglycemic s/sx including dizziness, shakiness, sweating. Patient denies hyperglycemic symptoms including polyuria, polydipsia, polyphagia, nocturia, neuropathy, blurred vision.  Current meal patterns:  - 2 meals/day  - Breakfast: hard boiled eggs, bacon - Lunch: salad, grilled chicken, salmon - Snacks: pickles, olives, fruit (eating more watermelon and mangoes), sandwich - Drinks:water, unsweet tea, mini diet coke daily  Current physical activity: limited by hip and back pain  Current medication access support: Patient has received [employer] PAP application for Jardiance  and is working on it  Hyperlipidemia/ASCVD Risk Reduction  Current lipid lowering medications: ezetimibe  10 mg daily Medications tried in the past: atorvastatin, rosuvastatin  (muscle pain)  Reports muscle pains with statins in the past. Discussed investigating coverage for non-statin therapies. Patient notes he still has rosuvastatin  5 mg tablets at home and previously was taking daily when he had muscle pains. Notes PCP had recommended taking a 1 tablet a few times a week which he never tried.   ASCVD History: none Family History: sister- heart disease Risk Factors: DM, HLD, HTN  Current physical activity: limited by hip and back pain  The 10-year ASCVD risk score (Arnett DK, et al., 2019) is: 17.2%   Values used to calculate the score:  Age: 64 years     Clincally relevant sex: Male     Is Non-Hispanic African American: No     Diabetic: Yes     Tobacco smoker: No      Systolic Blood Pressure: 111 mmHg     Is BP treated: Yes     HDL Cholesterol: 31 mg/dL     Total Cholesterol: 194 mg/dL    Objective:  Lab Results  Component Value Date   HGBA1C 9.0 (H) 11/07/2023    Lab Results  Component Value Date   CREATININE 1.12 11/07/2023   BUN 14 11/07/2023   NA 136 11/07/2023   K 4.7 11/07/2023   CL 100 11/07/2023   CO2 20 11/07/2023    Lab Results  Component Value Date   CHOL 194 11/07/2023   HDL 31 (L) 11/07/2023   LDLCALC 103 (H) 11/07/2023   TRIG 350 (H) 11/07/2023   CHOLHDL 6.3 (H) 11/07/2023    Medications Reviewed Today   Medications were not reviewed in this encounter       Assessment/Plan:   Diabetes: - Currently uncontrolled with last A1C 9.9% on 07/28/23, above goal <7%. Patient reported home BG has improved since starting on Jardiance , but is still elevated above goal. Previously intolerant of Ozempic  d/t GI upset and will need patient assistance for medication access. Unable to tolerate rybelsus . Will have to start insulin if patient still not controlled based on cost and past intolerances. - Reviewed long term cardiovascular and renal outcomes of uncontrolled blood sugar - Reviewed goal A1c, goal fasting, and goal 2 hour post prandial glucose - Reviewed dietary modifications including limiting portion sizes of higher carb foods (watermelon, mangoes). Encouraged incorporation of lower glycemic index fruits (berries). - Reviewed lifestyle modifications including: encouraged physical activity as able although limited by chronic pain - Recommend to continue metformin  500 mg - 2 tablets BID (cannot swallow larger 1000 mg tablets) - Recommend to continue Jardiance  25 mg daily. Medication sample placed up front. - Patient denies personal or family history of multiple endocrine neoplasia type 2, medullary thyroid  cancer; personal history of pancreatitis or gallbladder disease. - Recommend to check fasting blood glucose once daily -  Meets financial criteria for Jardiance  patient assistance program through [facility]. Encouraged patient to complete application that he received and return this. Wife states she will be mailing in. - A1C and UACR due at next PCP visit   Hyperlipidemia/ASCVD Risk Reduction: - Currently uncontrolled with last LDL 129 mg/dL on 07/30/73 and triglycerides elevated at 321 mg/dL. High risk primary prevention with risk factors DM, HTN, obesity. ASCVD risk score 22.5%. He has a history of intolerance to atorvastatin and rosuvastatin  due to muscle pain. Continue rosuvastatin  5 mg 2-3 times per week. May investigate coverage for non-statin therapies which is reasonable in the future.  - Reviewed long term complications of uncontrolled cholesterol - Reviewed dietary recommendations as above - Reviewed lifestyle recommendations including incorporating physical activity as able although limited by chronic pain - Recommend to continue taking rosuvastatin  5 mg 2-3 times per week and let us  know if he experiences muscle pain with this dosing. May need to investigate coverage for non-statin therapies in the future. - Lipid panel due at next PCP visit      Follow Up Plan: PCP on 02/08/24, PharmD 02/23/24   Mliss Tarry Griffin, PharmD, BCACP, CPP Clinical Pharmacist, [facility]

## 2024-01-18 NOTE — Progress Notes (Signed)
 01/18/2024 Name: Matthew Pennington MRN: [id number] DOB: 01/07/1966  Chief Complaint  Patient presents with   Diabetes    Matthew Pennington is a 58 y.o. year old male who presented for a telephone visit.   They were referred to the pharmacist by their PCP for assistance in managing diabetes and hyperlipidemia.    Subjective:  Care Team: Primary Care Provider: Lavell Bari LABOR, FNP ; Next Scheduled Visit: 11/07/23  Medication Access/Adherence  Current Pharmacy:  [facility] - [place], [place] - [address] [place] [address] Phone: [phone] Fax: [phone]  [facility] - [place], [place] - [address] [place]  [address] Phone: [phone] Fax: [phone]  [facility] [address], [place] - [address] [place] [address] [place] [address] [place] [address] Phone: [phone] Fax: [phone]  [facility] - [place], [place] - [address] [place] [address] Phone: [phone] Fax: [phone]  [facility] [address] [place] - [address] [place] [address] Phone: [phone] Fax: [phone]  Patient reports affordability concerns with their medications: Yes - Jardiance  >$400 at the pharmacy, currently working on BI Cares PAP Patient reports access/transportation concerns to their pharmacy: No  Patient reports adherence concerns with their medications:  No    Diabetes:  Current medications: Jardiance  25 mg daily, metformin  1000 mg BID Medications tried in the past: Farxiga  (dizziness), Ozempic  (GI upset) -Patient is working on diet and exercise. States he is decreasing carb intake.Matthew Pennington He has increased water intake   PrePG 150 PostPG 180 Patient stated that Matthew Pennington caused GI upset.   Patient reports he has lost around 25 pounds.   Using glucometer; testing once daily Fasting BG in AM: 135 this morning, usually  140-150  Patient denies hypoglycemic s/sx including dizziness, shakiness, sweating. Patient denies hyperglycemic symptoms including polyuria, polydipsia, polyphagia, nocturia, neuropathy, blurred vision.  Current meal patterns:  - 2 meals/day  - Breakfast: hard boiled eggs, bacon - Lunch: salad, grilled chicken, salmon - Snacks: pickles, olives, fruit (eating more watermelon and mangoes), sandwich - Drinks:water, unsweet tea, mini diet coke daily  Current medication access support: Patient has received BI Cares PAP application for Jardiance  and is working on it.   Hyperlipidemia/ASCVD Risk Reduction  Current lipid lowering medications: ezetimibe  10 mg daily, Rosuvastatin  3 times weekly.  -Taking Rosuvastatin  at night.   Medications tried in the past: atorvastatin, rosuvastatin  (muscle pain)  Reports muscle pains with statins in the past.   ASCVD History: none Family History: sister- heart disease Risk Factors: DM, HLD, HTN  Current physical activity: limited by hip and back pain  The 10-year ASCVD risk score (Arnett DK, et al., 2019) is: 17.2%   Values used to calculate the score:     Age: 38 years     Clincally relevant sex: Male     Is Non-Hispanic African American: No     Diabetic: Yes     Tobacco smoker: No     Systolic Blood Pressure: 111 mmHg     Is BP treated: Yes     HDL Cholesterol: 31 mg/dL     Total Cholesterol: 194 mg/dL    Objective:  Lab Results  Component Value Date   HGBA1C 9.0 (H) 11/07/2023    Lab Results  Component Value Date   CREATININE 1.12 11/07/2023   BUN 14 11/07/2023  NA 136 11/07/2023   K 4.7 11/07/2023   CL 100 11/07/2023   CO2 20 11/07/2023    Lab Results  Component Value Date   CHOL 194 11/07/2023   HDL 31 (L) 11/07/2023   LDLCALC 103 (H) 11/07/2023   TRIG 350 (H) 11/07/2023   CHOLHDL 6.3 (H) 11/07/2023    Medications Reviewed Today     Reviewed by Billee Mliss BIRCH, [facility] (Pharmacist) on 01/23/24 at 1021  Med List  Status: <None>   Medication Order Taking? Sig Documenting Provider Last Dose Status Informant  albuterol  (VENTOLIN  HFA) 108 (90 Base) MCG/ACT inhaler [id number]  Inhale 2 puffs into the lungs every 6 (six) hours as needed (cough). Severa Rock HERO, FNP  Active   baclofen  (LIORESAL ) 10 MG tablet [id number]  Take 1 tablet (10 mg total) by mouth 3 (three) times daily. Vivienne Delon HERO, PA-C  Active   Blood Glucose Monitoring Suppl [facility] VERIO REFLECT) w/Device [name] [id number]   [provider]  Active   cephALEXin  (KEFLEX ) 500 MG capsule [id number]  Take 1 capsule (500 mg total) by mouth 3 (three) times daily. Lavell Lye A, FNP  Active   diclofenac  Sodium (VOLTAREN ) 1 % GEL [id number]  Apply 4 g topically 4 (four) times daily. Lavell Lye A, FNP  Active   empagliflozin  (JARDIANCE ) 25 MG TABS tablet [id number]  Take 1 tablet (25 mg total) by mouth daily before breakfast. Lavell Lye A, FNP  Active   enalapril  (VASOTEC ) 20 MG tablet [id number]  Take 1 tablet by mouth once daily Lavell Lye A, FNP  Active   ezetimibe  (ZETIA ) 10 MG tablet [id number]  Take 1 tablet by mouth once daily Lavell Lye A, FNP  Active   fluticasone  (FLONASE ) 50 MCG/ACT nasal spray [id number]  Place 2 sprays into both nostrils daily. Severa Rock HERO, FNP  Active   gabapentin  (NEURONTIN ) 300 MG capsule [id number]  TAKE 1 CAPSULE BY MOUTH THREE TIMES DAILY Lavell Lye A, FNP  Active   glucose blood (ONETOUCH VERIO) test strip [id number]  Use as instructed to test daily; DX:e11.65 Lavell Lye A, FNP  Active   ibuprofen  (ADVIL ) 800 MG tablet [id number]  Take 1 tablet (800 mg total) by mouth every 8 (eight) hours as needed. Lavell Lye LABOR, FNP  Active   Lancet Devices (SIMPLE DIAGNOSTICS LANCING DEV) MISC [id number]  Apply topically. [provider]  Active   levocetirizine (XYZAL ) 5 MG tablet [id number]  Take 1 tablet (5 mg total) by mouth every evening. Severa Rock HERO, FNP  Active   lidocaine  (LIDODERM )  5 % [id number]  Place 1 patch onto the skin daily. Remove & Discard patch within 12 hours or as directed by MD Kommor, Lum, MD  Active   metFORMIN  (GLUCOPHAGE ) 500 MG tablet [id number]  TAKE 2 TABLETS BY MOUTH TWICE DAILY WITH A MEAL Hawks, Christy A, FNP  Active   mupirocin  cream (BACTROBAN ) 2 % [id number]  Apply 1 Application topically 2 (two) times daily. Lavell Lye A, FNP  Active   oxcarbazepine  (TRILEPTAL ) 600 MG tablet [id number]  Take 1 tablet (600 mg total) by mouth 3 (three) times daily. Lavell Lye A, FNP  Active   oxyCODONE -acetaminophen  (PERCOCET/ROXICET) 5-325 MG tablet [id number]  Take 1 tablet by mouth every 6 (six) hours as needed for severe pain. Zammit, Joseph, MD  Active   pantoprazole  (PROTONIX ) 40 MG tablet [id number]  Take 1 tablet (40 mg total) by mouth daily. Hawks, Lye A, FNP  Active   rOPINIRole  (REQUIP ) 0.25 MG  tablet [id number]  TAKE 1 TO 2 TABLETS BY MOUTH AT BEDTIME FOR  RESTLESS  LEGS Hawks, Christy A, FNP  Active   rosuvastatin  (CRESTOR ) 5 MG tablet [id number]  Take 1 tablet (5 mg total) by mouth 3 (three) times a week. Lavell Lye A, FNP  Active   Semaglutide  (RYBELSUS ) 3 MG TABS [id number]  Take 1 tablet (3 mg total) by mouth daily. Medication sample  Patient not taking: Reported on 01/23/2024   Lavell Lye A, FNP  Active   traMADol  (ULTRAM ) 50 MG tablet [id number]  Take 1 tablet (50 mg total) by mouth daily. Lavell Lye A, FNP  Active               Assessment/Plan:   Diabetes: - Currently uncontrolled with last A1C 9.9% on 07/28/23, above goal <7%.  - Recommend to continue metformin  500 mg - 2 tablets BID (cannot swallow larger 1000 mg tablets) - Recommend to continue Jardiance  25 mg daily. Medication sample placed up front. - Patient denies personal or family history of multiple endocrine neoplasia type 2, medullary thyroid  cancer; personal history of pancreatitis or gallbladder disease. - Recommend to check fasting blood glucose once  daily - Meets financial criteria for Jardiance  patient assistance program through [facility]. Encouraged patient to complete application that he received and return this.   Hyperlipidemia/ASCVD Risk Reduction: - Currently uncontrolled with last LDL 103 mg/dL on 3/89/74 and triglycerides elevated at 350 mg/dL. High risk primary prevention with risk factors DM, HTN, obesity. ASCVD risk score 22.5%. He has a history of intolerance to atorvastatin and rosuvastatin  due to muscle pain.  -Continue current therapy   Follow Up Plan: PCP on 02/08/24   Lum Ricks, PharmD Candidate  [facility], [employer]    Mliss Tarry Griffin, PharmD, [place], CPP Clinical Pharmacist, [facility]

## 2024-01-21 ENCOUNTER — Other Ambulatory Visit: Payer: Self-pay | Admitting: Family

## 2024-01-21 DIAGNOSIS — E1169 Type 2 diabetes mellitus with other specified complication: Secondary | ICD-10-CM

## 2024-01-21 DIAGNOSIS — G500 Trigeminal neuralgia: Secondary | ICD-10-CM

## 2024-01-22 ENCOUNTER — Other Ambulatory Visit: Payer: Self-pay | Admitting: Family

## 2024-01-22 DIAGNOSIS — G500 Trigeminal neuralgia: Secondary | ICD-10-CM

## 2024-02-06 NOTE — Progress Notes (Addendum)
 Pharmacy Medication Assistance Program Note    02/06/2024  Patient ID: Matthew Pennington, male  DOB: Jul 09, 1965, 58 y.o.  MRN:  [id number]     02/06/2024  Outreach Medication Three  Manufacturer Medication Three Novo Nordisk  [facility]bels[facility]   Type of Radiographer, therapeutic Assistance  Date Application Received From Patient 02/02/2024  Application Items Received From Patient Application     Per 01/18/24 encounter, patient stopped taking medication.   Application scanned to media. No further action needed.

## 2024-02-08 ENCOUNTER — Ambulatory Visit (INDEPENDENT_AMBULATORY_CARE_PROVIDER_SITE_OTHER): Admitting: Family

## 2024-02-08 ENCOUNTER — Encounter: Payer: Self-pay | Admitting: Family

## 2024-02-08 ENCOUNTER — Telehealth: Payer: Self-pay | Admitting: Family

## 2024-02-08 VITALS — BP 112/71 | HR 93 | Temp 98.2°F | Ht 73.0 in | Wt 278.6 lb

## 2024-02-08 DIAGNOSIS — E1169 Type 2 diabetes mellitus with other specified complication: Secondary | ICD-10-CM

## 2024-02-08 DIAGNOSIS — I152 Hypertension secondary to endocrine disorders: Secondary | ICD-10-CM

## 2024-02-08 DIAGNOSIS — R300 Dysuria: Secondary | ICD-10-CM | POA: Diagnosis not present

## 2024-02-08 DIAGNOSIS — K219 Gastro-esophageal reflux disease without esophagitis: Secondary | ICD-10-CM

## 2024-02-08 DIAGNOSIS — E1159 Type 2 diabetes mellitus with other circulatory complications: Secondary | ICD-10-CM | POA: Diagnosis not present

## 2024-02-08 DIAGNOSIS — J029 Acute pharyngitis, unspecified: Secondary | ICD-10-CM | POA: Diagnosis not present

## 2024-02-08 DIAGNOSIS — G4762 Sleep related leg cramps: Secondary | ICD-10-CM

## 2024-02-08 DIAGNOSIS — F331 Major depressive disorder, recurrent, moderate: Secondary | ICD-10-CM

## 2024-02-08 DIAGNOSIS — M797 Fibromyalgia: Secondary | ICD-10-CM | POA: Diagnosis not present

## 2024-02-08 DIAGNOSIS — M545 Low back pain, unspecified: Secondary | ICD-10-CM | POA: Diagnosis not present

## 2024-02-08 DIAGNOSIS — G8929 Other chronic pain: Secondary | ICD-10-CM | POA: Diagnosis not present

## 2024-02-08 DIAGNOSIS — G500 Trigeminal neuralgia: Secondary | ICD-10-CM | POA: Diagnosis not present

## 2024-02-08 DIAGNOSIS — G4733 Obstructive sleep apnea (adult) (pediatric): Secondary | ICD-10-CM

## 2024-02-08 DIAGNOSIS — Z79899 Other long term (current) drug therapy: Secondary | ICD-10-CM

## 2024-02-08 DIAGNOSIS — F411 Generalized anxiety disorder: Secondary | ICD-10-CM

## 2024-02-08 LAB — URINALYSIS, ROUTINE W REFLEX MICROSCOPIC
Bilirubin, UA: NEGATIVE
Ketones, UA: NEGATIVE
Leukocytes,UA: NEGATIVE
Nitrite, UA: NEGATIVE
Protein,UA: NEGATIVE
RBC, UA: NEGATIVE
Specific Gravity, UA: 1.01 (ref 1.005–1.030)
Urobilinogen, Ur: 0.2 mg/dL (ref 0.2–1.0)
pH, UA: 5.5 (ref 5.0–7.5)

## 2024-02-08 LAB — BAYER DCA HB A1C WAIVED: HB A1C (BAYER DCA - WAIVED): 8.2 % — ABNORMAL HIGH (ref 4.8–5.6)

## 2024-02-08 LAB — RAPID STREP SCREEN (MED CTR MEBANE ONLY): Strep Gp A Ag, IA W/Reflex: NEGATIVE

## 2024-02-08 LAB — CULTURE, GROUP A STREP

## 2024-02-08 MED ORDER — METFORMIN HCL 500 MG PO TABS: ORAL_TABLET | ORAL | 0 refills | Status: DC

## 2024-02-08 MED ORDER — ROPINIROLE HCL 0.25 MG PO TABS: ORAL_TABLET | ORAL | 1 refills | Status: AC

## 2024-02-08 MED ORDER — EZETIMIBE 10 MG PO TABS: 10.0000 mg | ORAL_TABLET | Freq: Every day | ORAL | 0 refills | Status: AC

## 2024-02-08 MED ORDER — DULOXETINE HCL 30 MG PO CPEP: 30.0000 mg | ORAL_CAPSULE | Freq: Every day | ORAL | 1 refills | Status: AC

## 2024-02-08 MED ORDER — KETOROLAC TROMETHAMINE 60 MG/2ML IM SOLN
60.0000 mg | Freq: Once | INTRAMUSCULAR | Status: AC
  Administered 2024-02-08: 60 mg via INTRAMUSCULAR

## 2024-02-08 MED ORDER — PANTOPRAZOLE SODIUM 40 MG PO TBEC: 40.0000 mg | DELAYED_RELEASE_TABLET | Freq: Every day | ORAL | 1 refills | Status: AC

## 2024-02-08 MED ORDER — IBUPROFEN 800 MG PO TABS: 800.0000 mg | ORAL_TABLET | Freq: Three times a day (TID) | ORAL | 2 refills | Status: AC | PRN

## 2024-02-08 MED ORDER — GABAPENTIN 300 MG PO CAPS: 300.0000 mg | ORAL_CAPSULE | Freq: Three times a day (TID) | ORAL | 0 refills | Status: AC

## 2024-02-08 MED ORDER — EMPAGLIFLOZIN 25 MG PO TABS: 25.0000 mg | ORAL_TABLET | Freq: Every day | ORAL | 2 refills | Status: AC

## 2024-02-08 MED ORDER — TRAMADOL HCL 50 MG PO TABS
50.0000 mg | ORAL_TABLET | Freq: Every day | ORAL | 2 refills | Status: AC
Start: 2024-02-08 — End: ?

## 2024-02-08 MED ORDER — AMOXICILLIN 500 MG PO CAPS: 500.0000 mg | ORAL_CAPSULE | Freq: Two times a day (BID) | ORAL | 0 refills | Status: DC

## 2024-02-08 NOTE — Addendum Note (Signed)
 Addended by: LAVELL LYE A on: 02/08/2024 12:11 PM   Modules accepted: Level of Service

## 2024-02-08 NOTE — Patient Instructions (Signed)
Myofascial Pain Syndrome and Fibromyalgia Myofascial pain syndrome and fibromyalgia are both pain disorders. You may feel this pain mainly in your muscles. Myofascial pain syndrome: Always has tender points in the muscles that will cause pain when pressed (trigger points). The pain may come and go. Usually affects your neck, upper back, and shoulder areas. The pain often moves into your arms and hands. Fibromyalgia: Has muscle pains and tenderness that come and go. Is often associated with tiredness (fatigue) and sleep problems. Has trigger points. Tends to be long-lasting (chronic), but is not life-threatening. Fibromyalgia and myofascial pain syndrome are not the same. However, they often occur together. If you have both conditions, each can make the other worse. Both are common and can cause enough pain and fatigue to make day-to-day activities difficult. Both can be hard to diagnose because their symptoms are common in many other conditions. What are the causes? The exact causes of these conditions are not known. What increases the risk? You are more likely to develop either of these conditions if: You have a family history of the condition. You are male. You have certain triggers, such as: Spine disorders. An injury (trauma) or other physical stressors. Being under a lot of stress. Medical conditions such as osteoarthritis, rheumatoid arthritis, or lupus. What are the signs or symptoms? Fibromyalgia The main symptom of fibromyalgia is widespread pain and tenderness in your muscles. Pain is sometimes described as stabbing, shooting, or burning. You may also have: Tingling or numbness. Sleep problems and fatigue. Problems with attention and concentration (fibro fog). Other symptoms may include: Bowel and bladder problems. Headaches. Vision problems. Sensitivity to odors and noises. Depression or mood changes. Painful menstrual periods (dysmenorrhea). Dry skin or eyes. These  symptoms can vary over time. Myofascial pain syndrome Symptoms of myofascial pain syndrome include: Tight, ropy bands of muscle. Uncomfortable sensations in muscle areas. These may include aching, cramping, burning, numbness, tingling, and weakness. Difficulty moving certain parts of the body freely (poor range of motion). How is this diagnosed? This condition may be diagnosed by your symptoms and medical history. You will also have a physical exam. In general: Fibromyalgia is diagnosed if you have pain, fatigue, and other symptoms for more than 3 months, and symptoms cannot be explained by another condition. Myofascial pain syndrome is diagnosed if you have trigger points in your muscles, and those trigger points are tender and cause pain elsewhere in your body (referred pain). How is this treated? Treatment for these conditions depends on the type that you have. For fibromyalgia, a healthy lifestyle is the most important treatment including aerobic and strength exercises. Different types of medicines are used to help treat pain and include: NSAIDs. Medicines for treating depression. Medicines that help control seizures. Medicines that relax the muscles. Treatment for myofascial pain syndrome includes: Pain medicines, such as NSAIDs. Cooling and stretching of muscles. Massage therapy with myofascial release technique. Trigger point injections. Treating these conditions often requires a team of health care providers. These may include: Your primary care provider. A physical therapist. Complementary health care providers, such as massage therapists or acupuncturists. A psychiatrist for cognitive behavioral therapy. Follow these instructions at home: Medicines Take over-the-counter and prescription medicines only as told by your health care provider. Ask your health care provider if the medicine prescribed to you: Requires you to avoid driving or using machinery. Can cause constipation.  You may need to take these actions to prevent or treat constipation: Drink enough fluid to keep your urine pale   yellow. Take over-the-counter or prescription medicines. Eat foods that are high in fiber, such as beans, whole grains, and fresh fruits and vegetables. Limit foods that are high in fat and processed sugars, such as fried or sweet foods. Lifestyle  Do exercises as told by your health care provider or physical therapist. Practice relaxation techniques to control your stress. You may want to try: Biofeedback. Visual imagery. Hypnosis. Muscle relaxation. Yoga. Meditation. Maintain a healthy lifestyle. This includes eating a healthy diet and getting enough sleep. Do not use any products that contain nicotine or tobacco. These products include cigarettes, chewing tobacco, and vaping devices, such as e-cigarettes. If you need help quitting, ask your health care provider. General instructions Talk to your health care provider about complementary treatments, such as acupuncture or massage. Do not do activities that stress or strain your muscles. This includes repetitive motions and heavy lifting. Keep all follow-up visits. This is important. Where to find support Consider joining a support group with others who are diagnosed with this condition. National Fibromyalgia Association: fmaware.org Where to find more information U.S. Pain Foundation: uspainfoundation.org Contact a health care provider if: You have new symptoms. Your symptoms get worse or your pain is severe. You have side effects from your medicines. You have trouble sleeping. Your condition is causing depression or anxiety. Get help right away if: You have thoughts of hurting yourself or others. Get help right away if you feel like you may hurt yourself or others, or have thoughts about taking your own life. Go to your nearest emergency room or: Call 911. Call the National Suicide Prevention Lifeline at 1-800-273-8255  or 988. This is open 24 hours a day. Text the Crisis Text Line at 741741. This information is not intended to replace advice given to you by your health care provider. Make sure you discuss any questions you have with your health care provider. Document Revised: 02/21/2022 Document Reviewed: 04/16/2021 Elsevier Patient Education  2024 [employer].  

## 2024-02-08 NOTE — Progress Notes (Signed)
 Subjective:    Patient ID: Matthew Pennington, male    DOB: May 25, 1966, 58 y.o.   MRN: [id number]  Chief Complaint  Patient presents with   3 MONTH FOLLOW UP   Pt presents to the office today for chronic follow up.   He is followed by Neurologists for Trigeminal neuralgia as needed.   He does take Ultram  as needed for chronic back pain. He only takes this 1-2 times a month. He never mixes the two.    He has OSA and uses CPAP nightly.   Complaining fatigue.    He is having right hand weakness and is followed by Ortho.    Reports statins causes muscle pain and can not tolerate.  Hypertension This is a chronic problem. The current episode started more than 1 year ago. The problem has been resolved since onset. The problem is controlled. Associated symptoms include headaches and malaise/fatigue. Pertinent negatives include no blurred vision, peripheral edema or shortness of breath. Risk factors for coronary artery disease include obesity, dyslipidemia and male gender. The current treatment provides moderate improvement.  Gastroesophageal Reflux He complains of belching and heartburn. He reports no coughing or no hoarse voice. This is a chronic problem. The current episode started more than 1 year ago. The problem occurs occasionally. The symptoms are aggravated by certain foods. Associated symptoms include fatigue. Risk factors include obesity. He has tried a PPI for the symptoms. The treatment provided moderate relief.  Hyperlipidemia This is a chronic problem. The current episode started more than 1 year ago. The problem is uncontrolled. Exacerbating diseases include obesity. Pertinent negatives include no shortness of breath. Current antihyperlipidemic treatment includes diet change, ezetimibe  and statins. The current treatment provides mild improvement of lipids. Risk factors for coronary artery disease include dyslipidemia, diabetes mellitus, hypertension, male sex and a sedentary lifestyle.   Diabetes He presents for his follow-up diabetic visit. He has type 2 diabetes mellitus. Hypoglycemia symptoms include headaches. Associated symptoms include fatigue and foot paresthesias. Pertinent negatives for diabetes include no blurred vision. Diabetic complications include peripheral neuropathy. Risk factors for coronary artery disease include dyslipidemia, diabetes mellitus, hypertension, male sex and sedentary lifestyle. He is following a generally unhealthy diet. His overall blood glucose range is 140-180 mg/dl. Eye exam is not current.  Arthritis Presents for follow-up visit. He complains of pain and stiffness. Affected locations include the left knee, right knee, left MCP, right MCP, right shoulder and left shoulder. His pain is at a severity of 8/10. Associated symptoms include fatigue.  Depression        This is a chronic problem.  The current episode started more than 1 year ago.   The onset quality is gradual.   The problem occurs intermittently.  Associated symptoms include decreased concentration, fatigue, helplessness, hopelessness, headaches and sad.  Past treatments include SNRIs - Serotonin and norepinephrine reuptake inhibitors. Sore Throat  This is a new problem. The current episode started 1 to 4 weeks ago. The problem has been unchanged. There has been no fever. The pain is at a severity of 6/10. The pain is moderate. Associated symptoms include headaches and trouble swallowing. Pertinent negatives include no coughing, ear pain, hoarse voice or shortness of breath. He has tried acetaminophen  for the symptoms. The treatment provided mild relief.    Current opioids rx- Ultram  50 mg # meds rx- 20 Effectiveness of current meds-stable Adverse reactions from pain meds-constipation Morphine equivalent- 5  Pill count performed-No Last drug screen - 07/28/23 ( high risk q69m,  moderate risk q79m, low risk yearly ) Urine drug screen today- No Was the [facility] reviewed- yes  If yes were  their any concerning findings? - Does get oxycodone , does not take Ultram  with the oxycodone      08/23/2019   10:44 AM  Opioid Risk   Alcohol 0   Illegal Drugs 0  Rx Drugs 0  Alcohol 0  Illegal Drugs 0  Rx Drugs 0  Age between 16-45 years  1  Psychological Disease 0  Depression 1  Opioid Risk Tool Scoring 2  Opioid Risk Interpretation Low Risk     Data saved with a previous flowsheet row definition     Pain contract signed on: 07/28/23    Review of Systems  Constitutional:  Positive for fatigue and malaise/fatigue.  HENT:  Positive for trouble swallowing. Negative for ear pain and hoarse voice.   Eyes:  Negative for blurred vision.  Respiratory:  Negative for cough and shortness of breath.   Gastrointestinal:  Positive for heartburn.  Musculoskeletal:  Positive for stiffness.  Neurological:  Positive for headaches.  Psychiatric/Behavioral:  Positive for decreased concentration.   All other systems reviewed and are negative.   Family History  Problem Relation Age of Onset   Diabetes Mother    Hyperlipidemia Mother    Hypertension Mother    Cancer Father    Lung cancer Father    Heart disease Sister    Social History   Socioeconomic History   Marital status: Married    Spouse name: Daine   Number of children: 3   Years of education: Not on file   Highest education level: Bachelor's degree (e.g., BA, AB, BS)  Occupational History   Not on file  Tobacco Use   Smoking status: Never    Passive exposure: Past   Smokeless tobacco: Never  Vaping Use   Vaping status: Never Used  Substance and Sexual Activity   Alcohol use: No   Drug use: No   Sexual activity: Yes    Comment: getting limitted due to his pain  Other Topics Concern   Not on file  Social History Narrative   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   Social Drivers of Corporate investment banker Strain: Low Risk  (03/16/2023)    Overall Financial Resource Strain (CARDIA)    Difficulty of Paying Living Expenses: Not hard at all  Food Insecurity: No Food Insecurity (03/16/2023)   Hunger Vital Sign    Worried About Running Out of Food in the Last Year: Never true    Ran Out of Food in the Last Year: Never true  Transportation Needs: No Transportation Needs (03/16/2023)   PRAPARE - Administrator, Civil Service (Medical): No    Lack of Transportation (Non-Medical): No  Physical Activity: Inactive (03/16/2023)   Exercise Vital Sign    Days of Exercise per Week: 0 days    Minutes of Exercise per Session: 0 min  Stress: No Stress Concern Present (03/16/2023)   [employer] of Occupational Health - Occupational Stress Questionnaire    Feeling of Stress : Not at all  Recent Concern: Stress - Stress Concern Present (01/02/2023)   [employer] of Occupational Health - Occupational Stress Questionnaire    Feeling of Stress : Very much  Social Connections: Moderately Integrated (03/16/2023)   Social Connection and Isolation Panel    Frequency of Communication with Friends  and Family: More than three times a week    Frequency of Social Gatherings with Friends and Family: More than three times a week    Attends Religious Services: 1 to 4 times per year    Active Member of [employer] or Organizations: No    Attends Banker Meetings: Never    Marital Status: Married       Objective:   Physical Exam Vitals reviewed.  Constitutional:      General: He is not in acute distress.    Appearance: He is well-developed. He is obese.  HENT:     Head: Normocephalic.     Right Ear: Tympanic membrane normal.     Left Ear: Tympanic membrane normal.     Mouth/Throat:     Pharynx: Oropharyngeal exudate (left tonsil) and posterior oropharyngeal erythema present.  Eyes:     General:        Right eye: No discharge.        Left eye: No discharge.     Pupils: Pupils are equal, round, and reactive to light.   Neck:     Thyroid : No thyromegaly.  Cardiovascular:     Rate and Rhythm: Normal rate and regular rhythm.     Heart sounds: Normal heart sounds. No murmur heard. Pulmonary:     Effort: Pulmonary effort is normal. No respiratory distress.     Breath sounds: Normal breath sounds. No wheezing.  Abdominal:     General: Bowel sounds are normal. There is no distension.     Palpations: Abdomen is soft.     Tenderness: There is no abdominal tenderness.  Musculoskeletal:        General: No tenderness.     Cervical back: Normal range of motion and neck supple.     Comments: Pain in lumbar with flexion and extension  Skin:    General: Skin is warm and dry.     Findings: No erythema or rash.  Neurological:     Mental Status: He is alert and oriented to person, place, and time.     Cranial Nerves: No cranial nerve deficit.     Deep Tendon Reflexes: Reflexes are normal and symmetric.  Psychiatric:        Mood and Affect: Affect is flat.        Behavior: Behavior normal.        Thought Content: Thought content normal.        Judgment: Judgment normal.      BP 112/71   Pulse 93   Temp 98.2 F (36.8 C)   Ht 6' 1 (1.854 m)   Wt 278 lb 9.6 oz (126.4 kg)   SpO2 97%   BMI 36.76 kg/m      Assessment & Plan:   Jazziel Fitzsimmons comes in today with chief complaint of 3 MONTH FOLLOW UP   Diagnosis and orders addressed:  1. Hyperlipidemia associated with type 2 diabetes mellitus (HCC) - ezetimibe  (ZETIA ) 10 MG tablet; Take 1 tablet (10 mg total) by mouth daily.  Dispense: 90 tablet; Refill: 0 - CMP14+EGFR - CBC with Differential/Platelet  2. Trigeminal neuralgia - gabapentin  (NEURONTIN ) 300 MG capsule; Take 1 capsule (300 mg total) by mouth 3 (three) times daily.  Dispense: 270 capsule; Refill: 0 - traMADol  (ULTRAM ) 50 MG tablet; Take 1 tablet (50 mg total) by mouth daily.  Dispense: 20 tablet; Refill: 2 - CMP14+EGFR - CBC with Differential/Platelet  3. Chronic bilateral low back pain  without sciatica - ibuprofen  (ADVIL )  800 MG tablet; Take 1 tablet (800 mg total) by mouth every 8 (eight) hours as needed.  Dispense: 180 tablet; Refill: 2 - traMADol  (ULTRAM ) 50 MG tablet; Take 1 tablet (50 mg total) by mouth daily.  Dispense: 20 tablet; Refill: 2 - CMP14+EGFR - CBC with Differential/Platelet - ketorolac  (TORADOL ) injection 60 mg  4. Type 2 diabetes mellitus with other specified complication, without long-term current use of insulin (HCC) (Primary) - empagliflozin  (JARDIANCE ) 25 MG TABS tablet; Take 1 tablet (25 mg total) by mouth daily before breakfast.  Dispense: 90 tablet; Refill: 2 - metFORMIN  (GLUCOPHAGE ) 500 MG tablet; TAKE 2 TABLETS BY MOUTH TWICE DAILY WITH A MEAL  Dispense: 360 tablet; Refill: 0 - CMP14+EGFR - CBC with Differential/Platelet - Bayer DCA Hb A1c Waived  5. Gastroesophageal reflux disease, unspecified whether esophagitis present - pantoprazole  (PROTONIX ) 40 MG tablet; Take 1 tablet (40 mg total) by mouth daily.  Dispense: 90 tablet; Refill: 1 - CMP14+EGFR - CBC with Differential/Platelet  6. Leg cramps, sleep related - rOPINIRole  (REQUIP ) 0.25 MG tablet; TAKE 1 TO 2 TABLETS BY MOUTH AT BEDTIME FOR  RESTLESS  LEGS  Dispense: 180 tablet; Refill: 1 - CMP14+EGFR - CBC with Differential/Platelet  7. Dysuria - Urinalysis, Routine w reflex microscopic - CMP14+EGFR - CBC with Differential/Platelet  8. Hypertension associated with diabetes (HCC)  - CMP14+EGFR - CBC with Differential/Platelet  9. GAD (generalized anxiety disorder) - CMP14+EGFR - CBC with Differential/Platelet - DULoxetine  (CYMBALTA ) 30 MG capsule; Take 1 capsule (30 mg total) by mouth daily.  Dispense: 90 capsule; Refill: 1  10. OSA (obstructive sleep apnea) - CMP14+EGFR - CBC with Differential/Platelet  11. Controlled substance agreement signed  - CMP14+EGFR - CBC with Differential/Platelet  12. Moderate recurrent major depression (HCC) - CMP14+EGFR - CBC with  Differential/Platelet  13. Sore throat - Rapid Strep Screen ([facility] ONLY) - CMP14+EGFR - CBC with Differential/Platelet  14. Fibromyalgia Start Cymbalta  30 mg today - DULoxetine  (CYMBALTA ) 30 MG capsule; Take 1 capsule (30 mg total) by mouth daily.  Dispense: 90 capsule; Refill: 1 - ketorolac  (TORADOL ) injection 60 mg   15. Acute pharyngitis, unspecified etiology - Take meds as prescribed - Use a cool mist humidifier  -Use saline nose sprays frequently -Force fluids -For any cough or congestion  Use plain Mucinex- regular strength or max strength is fine -For fever or aces or pains- take tylenol  or ibuprofen . -Throat lozenges if help -New toothbrush in 3 days - amoxicillin  (AMOXIL ) 500 MG capsule; Take 1 capsule (500 mg total) by mouth 2 (two) times daily for 10 days.  Dispense: 20 capsule; Refill: 0  Labs pending Continue current medications  Keep specialists follow up Patient reviewed in [place] controlled database, no flags noted. Contract and drug screen are up to date.  Stress management  Health Maintenance reviewed Diet and exercise encouraged  Follow up plan: 3 months    [name], FNP

## 2024-02-08 NOTE — Telephone Encounter (Signed)
 Patient checking on Financial Assistance. States he turned it in a month ago.

## 2024-02-09 ENCOUNTER — Telehealth: Payer: Self-pay | Admitting: Pharmacist

## 2024-02-09 ENCOUNTER — Ambulatory Visit: Payer: Self-pay | Admitting: Family

## 2024-02-09 ENCOUNTER — Other Ambulatory Visit: Payer: Self-pay

## 2024-02-09 ENCOUNTER — Emergency Department (HOSPITAL_COMMUNITY)
Admission: EM | Admit: 2024-02-09 | Discharge: 2024-02-09 | Disposition: A | Discharge status: Home / Self Care | Attending: Emergency Medicine | Admitting: Emergency Medicine

## 2024-02-09 ENCOUNTER — Other Ambulatory Visit: Payer: Self-pay | Admitting: Family

## 2024-02-09 ENCOUNTER — Encounter (HOSPITAL_COMMUNITY): Payer: Self-pay

## 2024-02-09 DIAGNOSIS — J029 Acute pharyngitis, unspecified: Secondary | ICD-10-CM | POA: Diagnosis not present

## 2024-02-09 DIAGNOSIS — E1169 Type 2 diabetes mellitus with other specified complication: Secondary | ICD-10-CM

## 2024-02-09 DIAGNOSIS — E119 Type 2 diabetes mellitus without complications: Secondary | ICD-10-CM | POA: Insufficient documentation

## 2024-02-09 DIAGNOSIS — E1159 Type 2 diabetes mellitus with other circulatory complications: Secondary | ICD-10-CM

## 2024-02-09 LAB — CMP14+EGFR
ALT: 63 IU/L — ABNORMAL HIGH (ref 0–44)
AST: 43 IU/L — ABNORMAL HIGH (ref 0–40)
Albumin: 4.5 g/dL (ref 3.8–4.9)
Alkaline Phosphatase: 90 IU/L (ref 44–121)
BUN/Creatinine Ratio: 16 (ref 9–20)
BUN: 15 mg/dL (ref 6–24)
Bilirubin Total: 0.3 mg/dL (ref 0.0–1.2)
CO2: 19 mmol/L — ABNORMAL LOW (ref 20–29)
Calcium: 9.7 mg/dL (ref 8.7–10.2)
Chloride: 101 mmol/L (ref 96–106)
Creatinine, Ser: 0.94 mg/dL (ref 0.76–1.27)
Globulin, Total: 2.6 g/dL (ref 1.5–4.5)
Glucose: 196 mg/dL — ABNORMAL HIGH (ref 70–99)
Potassium: 4.8 mmol/L (ref 3.5–5.2)
Sodium: 139 mmol/L (ref 134–144)
Total Protein: 7.1 g/dL (ref 6.0–8.5)
eGFR: 94 mL/min/1.73 (ref >59)

## 2024-02-09 LAB — CBC WITH DIFFERENTIAL/PLATELET
Basophils Absolute: 0.1 x10E3/uL (ref 0.0–0.2)
Basos: 1 %
EOS (ABSOLUTE): 0.3 x10E3/uL (ref 0.0–0.4)
Eos: 4 %
Hematocrit: 50.9 % (ref 37.5–51.0)
Hemoglobin: 16.4 g/dL (ref 13.0–17.7)
Immature Grans (Abs): 0 x10E3/uL (ref 0.0–0.1)
Immature Granulocytes: 0 %
Lymphocytes Absolute: 2.7 x10E3/uL (ref 0.7–3.1)
Lymphs: 34 %
MCH: 29.8 pg (ref 26.6–33.0)
MCHC: 32.2 g/dL (ref 31.5–35.7)
MCV: 93 fL (ref 79–97)
Monocytes Absolute: 0.6 x10E3/uL (ref 0.1–0.9)
Monocytes: 7 %
Neutrophils Absolute: 4.3 x10E3/uL (ref 1.4–7.0)
Neutrophils: 54 %
Platelets: 290 x10E3/uL (ref 150–450)
RBC: 5.5 x10E6/uL (ref 4.14–5.80)
RDW: 13 % (ref 11.6–15.4)
WBC: 8 x10E3/uL (ref 3.4–10.8)

## 2024-02-09 MED ORDER — NYSTATIN 100000 UNIT/ML MT SUSP
5.0000 mL | Freq: Once | OROMUCOSAL | Status: AC
  Administered 2024-02-09: 500000 [IU] via ORAL
  Filled 2024-02-09: qty 5

## 2024-02-09 MED ORDER — NYSTATIN 100000 UNIT/ML MT SUSP: 5.0000 mL | Freq: Three times a day (TID) | OROMUCOSAL | 0 refills | Status: DC | PRN

## 2024-02-09 MED ORDER — METFORMIN HCL ER 750 MG PO TB24: 1500.0000 mg | ORAL_TABLET | Freq: Every day | ORAL | 1 refills | Status: DC

## 2024-02-09 MED ORDER — LIDOCAINE VISCOUS HCL 2 % MT SOLN
15.0000 mL | Freq: Once | OROMUCOSAL | Status: AC
  Administered 2024-02-09: 15 mL via OROMUCOSAL
  Filled 2024-02-09: qty 15

## 2024-02-09 NOTE — Telephone Encounter (Signed)
 Assisted in obtaining signature and reviewing PAP application for [employer] and Jardiance 

## 2024-02-09 NOTE — ED Triage Notes (Signed)
 Pov from home. Cc of oral thrush. Says he needs the mouth wash. Had a similar occurrence last year

## 2024-02-09 NOTE — ED Provider Notes (Signed)
 [place] EMERGENCY DEPARTMENT AT [facility] Provider Note   CSN: [id number] Arrival date & time: 02/09/24  0516     History Chief Complaint  Patient presents with   Matthew    Pennington    HPI Matthew Pennington is a 58 y.o. male presenting for chief complaint of sore throat. Seen by PCP yesterday. Negative for strep but still started on amoxil . Has a hx of thrush 2/2 DM. Recently has similar troubles with glucoses lately.   Patient's recorded medical, surgical, social, medication list and allergies were reviewed in the Snapshot window as part of the initial history.   Review of Systems   Review of Systems  Constitutional:  Negative for chills and fever.  HENT:  Positive for sore throat. Negative for ear pain.   Eyes:  Negative for pain and visual disturbance.  Respiratory:  Negative for cough and shortness of breath.   Cardiovascular:  Negative for chest pain and palpitations.  Gastrointestinal:  Negative for abdominal pain and vomiting.  Genitourinary:  Negative for dysuria and hematuria.  Musculoskeletal:  Negative for arthralgias and back pain.  Skin:  Negative for color change and rash.  Neurological:  Negative for seizures and syncope.  All other systems reviewed and are negative.   Physical Exam Updated Vital Signs BP (!) 148/89   Pulse 82   Temp 97.7 F (36.5 C)   Resp 18   SpO2 96%  Physical Exam Vitals and nursing note reviewed.  Constitutional:      General: He is not in acute distress.    Appearance: He is well-developed.  HENT:     Head: Normocephalic and atraumatic.  Eyes:     Conjunctiva/sclera: Conjunctivae normal.  Cardiovascular:     Rate and Rhythm: Normal rate and regular rhythm.     Heart sounds: No murmur heard. Pulmonary:     Effort: Pulmonary effort is normal. No respiratory distress.     Breath sounds: Normal breath sounds.  Abdominal:     Palpations: Abdomen is soft.     Tenderness: There is no abdominal tenderness.   Musculoskeletal:        General: No swelling.     Cervical back: Neck supple.  Skin:    General: Skin is warm and dry.     Capillary Refill: Capillary refill takes less than 2 seconds.  Neurological:     Mental Status: He is alert.  Psychiatric:        Mood and Affect: Mood normal.      ED Course/ Medical Decision Making/ A&P    Procedures Procedures   Medications Ordered in ED Medications  nystatin  (MYCOSTATIN ) 100000 UNIT/ML suspension 500,000 Units (500,000 Units Pennington Given 02/09/24 0557)  lidocaine  (XYLOCAINE ) 2 % viscous mouth solution 15 mL (15 mLs Mouth/Throat Given 02/09/24 0557)    Medical Decision Making:   58 YO with severe sore throat.  States he has a history of thrush secondary to his diabetes.  His exam right now is equivocal.  He has some posterior oropharyngeal erythema but no gross plaque formation or exudate.  He already tested negative for strep.  He does have a grandchild at home who is sick.  Considered that he likely has some element of an upper respiratory infection with pharyngitis symptoms but would be a poor candidate for steroids due to his diabetes. 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  Overall there is no  negative to trialing lidocaine  and nystatin  mouthwash and he may get substantial relief.  Recommend he follow-up with his PCP within 48 hours for reassessment. Clinical Impression:  1. Sore throat      Discharge   Final Clinical Impression(s) / ED Diagnoses Final diagnoses:  Sore throat    Rx / DC Orders ED Discharge Orders          Ordered    magic mouthwash (nystatin , lidocaine , diphenhydrAMINE , alum & mag hydroxide) suspension  3 times daily PRN        02/09/24 0551              Jerral Meth, MD 02/09/24 [phone]

## 2024-02-09 NOTE — Progress Notes (Signed)
 Reviewed results with patients wife and she voiced understanding.

## 2024-02-12 ENCOUNTER — Telehealth: Payer: Self-pay

## 2024-02-12 NOTE — Telephone Encounter (Signed)
 Pharmacy Medication Assistance Program Note    02/12/2024  Patient ID: Matthew Pennington, male  DOB: June 17, 1965, 58 y.o.  MRN:  [id number]     01/09/2024  Outreach Medication Two  Manufacturer Medication Two [employer]  [employer] Jardiance   Type of Radiographer, therapeutic Assistance  Date Application Sent to Patient 01/09/2024  Application Items Requested Application;Proof of Income  Date Application Received From Patient 02/02/2024  Application Items Received From Patient Application     In process of sending completed application

## 2024-02-13 NOTE — Progress Notes (Signed)
 Pharmacy Medication Assistance Program Note    02/13/2024  Patient ID: Matthew Pennington, male  DOB: 07-02-1965, 58 y.o.  MRN:  [id number]     01/09/2024 02/13/2024  Outreach Medication Two  Manufacturer Medication Two [employer]   [employer] Jardiance    Type of Radiographer, therapeutic Assistance   Date Application Sent to Patient 01/09/2024   Application Items Requested Application;Proof of Income   Date Application Received From Patient 02/02/2024   Application Items Received From Patient Application   Method Application Sent to Manufacturer  Fax  Date Application Submitted to Manufacturer  02/13/2024     NEW - SUBMITTED

## 2024-02-16 NOTE — Telephone Encounter (Signed)
   Proof of income needed - informed patients wife

## 2024-02-21 ENCOUNTER — Other Ambulatory Visit

## 2024-02-21 DIAGNOSIS — E119 Type 2 diabetes mellitus without complications: Secondary | ICD-10-CM

## 2024-02-21 MED ORDER — METFORMIN HCL ER 500 MG PO TB24: 1000.0000 mg | ORAL_TABLET | Freq: Two times a day (BID) | ORAL | 4 refills | Status: AC

## 2024-02-21 NOTE — Progress Notes (Signed)
 02/21/2024 Name: Matthew Pennington MRN: [id number] DOB: Mar 19, 1966  Chief Complaint  Patient presents with   Diabetes    Matthew Pennington is a 58 y.o. year old male who presented for a telephone visit.   They were referred to the pharmacist by their PCP for assistance in managing diabetes and hyperlipidemia.    Subjective  Patient reports his A1c has improved and he is continuing to make healthier choices with diet and lifestyle.  He has not yet turned in his Jardiance  BI care financial forms due to family illness.  He has 3 months worth of Jardiance  left and continues on metformin   Care Team: Primary Care Provider: Lavell Bari LABOR, FNP ; Next Scheduled Visit: 11/07/23  Medication Access/Adherence  Current Pharmacy:  [facility] [address] [place] - [address] [place] [address] Phone: [phone] Fax: [phone]  Patient reports affordability concerns with their medications: Yes - Jardiance  >$400 at the pharmacy, currently working on [facility] PAP Patient reports access/transportation concerns to their pharmacy: No  Patient reports adherence concerns with their medications:  No    Diabetes:  Current medications: Jardiance  25 mg daily, metformin  1000 mg BID Medications tried in the past: Farxiga  (dizziness), Ozempic  (GI upset) -Patient is working on diet and exercise. States he is decreasing carb intake.[name] He has increased water intake   PrePG 140-160 PostPG 180 Patient stated that [name] caused GI upset.   Patient reports he has lost around 25 pounds.   Using glucometer; testing once daily Fasting BG in AM: 135 this morning, usually 140-150  Patient denies hypoglycemic s/sx including dizziness, shakiness, sweating. Patient denies hyperglycemic symptoms including polyuria, polydipsia, polyphagia, nocturia, neuropathy, blurred vision.  Current meal patterns:  - 2 meals/day  - Breakfast: hard boiled eggs, bacon - Lunch: salad, grilled  chicken, salmon - Snacks: pickles, olives, fruit (eating more watermelon and mangoes), sandwich - Drinks:water, unsweet tea, mini diet coke daily  Current medication access support: Patient has received BI Cares PAP application for Jardiance  and is working on it.   Hyperlipidemia/ASCVD Risk Reduction  Current lipid lowering medications: ezetimibe  10 mg daily, Rosuvastatin  3 times weekly.  -Taking Rosuvastatin  at night.   Medications tried in the past: atorvastatin, rosuvastatin  (muscle pain)  Reports muscle pains with statins in the past.   ASCVD History: none Family History: sister- heart disease Risk Factors: DM, HLD, HTN  Current physical activity: limited by hip and back pain  The 10-year ASCVD risk score (Arnett DK, et al., 2019) is: 27.2%   Values used to calculate the score:     Age: 76 years     Clincally relevant sex: Male     Is Non-Hispanic African American: No     Diabetic: Yes     Tobacco smoker: No     Systolic Blood Pressure: 148 mmHg     Is BP treated: Yes     HDL Cholesterol: 31 mg/dL     Total Cholesterol: 194 mg/dL    Objective:  Lab Results  Component Value Date   HGBA1C 8.2 (H) 02/08/2024    Lab Results  Component Value Date   CREATININE 0.94 02/08/2024   BUN 15 02/08/2024   NA 139 02/08/2024   K 4.8 02/08/2024   CL 101 02/08/2024   CO2 19 (L) 02/08/2024    Lab Results  Component Value Date   CHOL 194 11/07/2023   HDL 31 (L) 11/07/2023   LDLCALC 103 (H) 11/07/2023   TRIG 350 (  H) 11/07/2023   CHOLHDL 6.3 (H) 11/07/2023    Medications Reviewed Today     Reviewed by Billee Mliss BIRCH, [facility] (Pharmacist) on 02/21/24 at 1208  Med List Status: <None>   Medication Order Taking? Sig Documenting Provider Last Dose Status Informant  albuterol  (VENTOLIN  HFA) 108 (90 Base) MCG/ACT inhaler [id number]  Inhale 2 puffs into the lungs every 6 (six) hours as needed (cough). Severa Rock HERO, FNP  Active   baclofen  (LIORESAL ) 10 MG tablet [id number]  Take  1 tablet (10 mg total) by mouth 3 (three) times daily. Vivienne Delon HERO, PA-C  Active   Blood Glucose Monitoring Suppl [facility] VERIO REFLECT) w/Device [name] [id number]   [provider]  Active   diclofenac  Sodium (VOLTAREN ) 1 % GEL [id number]  Apply 4 g topically 4 (four) times daily. Lavell Lye A, FNP  Active   DULoxetine  (CYMBALTA ) 30 MG capsule [id number]  Take 1 capsule (30 mg total) by mouth daily. Lavell Lye A, FNP  Active   empagliflozin  (JARDIANCE ) 25 MG TABS tablet [id number]  Take 1 tablet (25 mg total) by mouth daily before breakfast. Lavell Lye LABOR, FNP  Active            Med Note [name], [name]   Fri Feb 09, 2024 11:17 AM) Attempting to apply for BI cares PAP  enalapril  (VASOTEC ) 20 MG tablet [id number]  Take 1 tablet by mouth once daily Lavell Lye A, FNP  Active   ezetimibe  (ZETIA ) 10 MG tablet [id number]  Take 1 tablet (10 mg total) by mouth daily. Lavell Lye A, FNP  Active   fluticasone  (FLONASE ) 50 MCG/ACT nasal spray [id number]  Place 2 sprays into both nostrils daily. Severa Rock HERO, FNP  Active   gabapentin  (NEURONTIN ) 300 MG capsule [id number]  Take 1 capsule (300 mg total) by mouth 3 (three) times daily. Lavell Lye A, FNP  Active   glucose blood (ONETOUCH VERIO) test strip [id number]  Use as instructed to test daily; DX:e11.65 Lavell Lye A, FNP  Active   ibuprofen  (ADVIL ) 800 MG tablet [id number]  Take 1 tablet (800 mg total) by mouth every 8 (eight) hours as needed. Lavell Lye LABOR, FNP  Active   Lancet Devices (SIMPLE DIAGNOSTICS LANCING DEV) MISC [id number]  Apply topically. [provider]  Active   levocetirizine (XYZAL ) 5 MG tablet [id number]  Take 1 tablet (5 mg total) by mouth every evening. Severa Rock HERO, FNP  Active   lidocaine  (LIDODERM ) 5 % [id number]  Place 1 patch onto the skin daily. Remove & Discard patch within 12 hours or as directed by MD Kommor, Lum, MD  Active   magic mouthwash (nystatin , lidocaine ,  diphenhydrAMINE , alum & mag hydroxide) suspension [id number]  Swish and swallow 5 mLs 3 (three) times daily as needed for mouth pain. Jerral Meth, MD  Active   metFORMIN  (GLUCOPHAGE -XR) 500 MG 24 hr tablet [id number] Yes Take 2 tablets (1,000 mg total) by mouth 2 (two) times daily with a meal. Lavell Lye A, FNP  Active     Discontinued 02/21/24 1204   oxcarbazepine  (TRILEPTAL ) 600 MG tablet [id number]  Take 1 tablet (600 mg total) by mouth 3 (three) times daily. Lavell Lye A, FNP  Active   pantoprazole  (PROTONIX ) 40 MG tablet [id number]  Take 1 tablet (40 mg total) by mouth daily. Lavell Lye A, FNP  Active   rOPINIRole  (REQUIP ) 0.25 MG tablet [id number]  TAKE 1 TO 2 TABLETS BY MOUTH AT BEDTIME FOR  RESTLESS  LEGS Lavell Lye  A, FNP  Active   rosuvastatin  (CRESTOR ) 5 MG tablet [id number]  Take 1 tablet (5 mg total) by mouth 3 (three) times a week. Lavell Lye A, FNP  Active   traMADol  (ULTRAM ) 50 MG tablet [id number]  Take 1 tablet (50 mg total) by mouth daily. Lavell Lye A, FNP  Active               Assessment/Plan:   Diabetes: - Currently uncontrolled with decrease in A1C 9.9-->8.2% IMPROVED, above goal <7%.  - Recommend to continue metformin  500 mg - 2 tablets BID (cannot swallow larger 750 mg or 1000 mg tablets) - Recommend to continue Jardiance  25 mg daily. Medication sample placed up front. -discussed GLP1, but patient intolerant in the past; consider microclick dosing of Ozempic ; Mounjaro too costly - Patient denies personal or family history of multiple endocrine neoplasia type 2, medullary thyroid  cancer; personal history of pancreatitis or gallbladder disease. - Recommend to check fasting blood glucose once daily - Meets financial criteria for Jardiance  patient assistance program through [facility]. Awaiting proof of income Encouraged patient to complete application that he received and return this.   Hyperlipidemia/ASCVD Risk Reduction: - Currently  uncontrolled with last LDL 103 mg/dL on 3/89/74 and triglycerides elevated at 350 mg/dL. High risk primary prevention with risk factors DM, HTN, obesity. ASCVD risk score 22.5%. He has a history of intolerance to atorvastatin and rosuvastatin  due to muscle pain.  -Continue current therapy   Follow Up Plan: 2 months    Mliss Tarry [facility], PharmD, BCACP, CPP Clinical Pharmacist, [facility]

## 2024-02-22 ENCOUNTER — Other Ambulatory Visit

## 2024-03-01 ENCOUNTER — Encounter (HOSPITAL_COMMUNITY): Payer: Self-pay

## 2024-03-01 ENCOUNTER — Emergency Department (HOSPITAL_COMMUNITY)

## 2024-03-01 ENCOUNTER — Emergency Department (HOSPITAL_COMMUNITY)
Admission: EM | Admit: 2024-03-01 | Discharge: 2024-03-01 | Disposition: A | Discharge status: Home / Self Care | Attending: Emergency Medicine | Admitting: Emergency Medicine

## 2024-03-01 ENCOUNTER — Other Ambulatory Visit: Payer: Self-pay

## 2024-03-01 DIAGNOSIS — R059 Cough, unspecified: Secondary | ICD-10-CM | POA: Insufficient documentation

## 2024-03-01 DIAGNOSIS — R0989 Other specified symptoms and signs involving the circulatory and respiratory systems: Secondary | ICD-10-CM | POA: Diagnosis not present

## 2024-03-01 DIAGNOSIS — J069 Acute upper respiratory infection, unspecified: Secondary | ICD-10-CM | POA: Insufficient documentation

## 2024-03-01 DIAGNOSIS — R0981 Nasal congestion: Secondary | ICD-10-CM | POA: Diagnosis present

## 2024-03-01 LAB — RESP PANEL BY RT-PCR (RSV, FLU A&B, COVID)  RVPGX2
Influenza A by PCR: NEGATIVE
Influenza B by PCR: NEGATIVE
Resp Syncytial Virus by PCR: NEGATIVE
SARS Coronavirus 2 by RT PCR: NEGATIVE

## 2024-03-01 NOTE — ED Provider Notes (Signed)
 [facility] EMERGENCY DEPARTMENT AT [facility] Provider Note   CSN: [id number] Arrival date & time: 03/01/24  1008     Patient presents with: Nasal Congestion   Matthew Pennington is a 58 y.o. male.   Patient is a 58 year old male who presents emergency department the chief complaint of chest congestion which has been ongoing since yesterday.  He notes that his wife is currently admitted for pneumonia.  He notes that he has had some mild nasal congestion and mild sore throat.  He denies any associated fever or chills.  He has had no chest pain or shortness of breath.  He denies any abdominal pain, nausea, vomiting, diarrhea.        Prior to Admission medications   Medication Sig Start Date End Date Taking? Authorizing Provider  albuterol  (VENTOLIN  HFA) 108 (90 Base) MCG/ACT inhaler Inhale 2 puffs into the lungs every 6 (six) hours as needed (cough). 12/21/23   Severa Rock HERO, FNP  baclofen  (LIORESAL ) 10 MG tablet Take 1 tablet (10 mg total) by mouth 3 (three) times daily. 04/07/23   Vivienne Delon HERO, PA-C  Blood Glucose Monitoring Suppl (ONETOUCH VERIO REFLECT) w/Device KIT  02/27/20   [provider]  diclofenac  Sodium (VOLTAREN ) 1 % GEL Apply 4 g topically 4 (four) times daily. 11/07/23   Lavell Lye A, FNP  DULoxetine  (CYMBALTA ) 30 MG capsule Take 1 capsule (30 mg total) by mouth daily. 02/08/24   Lavell Lye LABOR, FNP  empagliflozin  (JARDIANCE ) 25 MG TABS tablet Take 1 tablet (25 mg total) by mouth daily before breakfast. 02/08/24   Lavell Lye LABOR, FNP  enalapril  (VASOTEC ) 20 MG tablet Take 1 tablet by mouth once daily 01/18/24   Lavell Lye A, FNP  ezetimibe  (ZETIA ) 10 MG tablet Take 1 tablet (10 mg total) by mouth daily. 02/08/24   Lavell Lye LABOR, FNP  fluticasone  (FLONASE ) 50 MCG/ACT nasal spray Place 2 sprays into both nostrils daily. 12/21/23   Severa Rock HERO, FNP  gabapentin  (NEURONTIN ) 300 MG capsule Take 1 capsule (300 mg total) by mouth 3 (three) times  daily. 02/08/24   Lavell Lye A, FNP  glucose blood (ONETOUCH VERIO) test strip Use as instructed to test daily; DX:e11.65 09/21/23   Lavell Lye LABOR, FNP  ibuprofen  (ADVIL ) 800 MG tablet Take 1 tablet (800 mg total) by mouth every 8 (eight) hours as needed. 02/08/24   Lavell Lye LABOR, FNP  Lancet Devices (SIMPLE DIAGNOSTICS LANCING DEV) MISC Apply topically. 07/26/21   [provider]  levocetirizine (XYZAL ) 5 MG tablet Take 1 tablet (5 mg total) by mouth every evening. 12/21/23   Rakes, Rock HERO, FNP  lidocaine  (LIDODERM ) 5 % Place 1 patch onto the skin daily. Remove & Discard patch within 12 hours or as directed by MD 04/04/23   Kommor, Lum, MD  magic mouthwash (nystatin , lidocaine , diphenhydrAMINE , alum & mag hydroxide) suspension Swish and swallow 5 mLs 3 (three) times daily as needed for mouth pain. 02/09/24   Jerral Meth, MD  metFORMIN  (GLUCOPHAGE -XR) 500 MG 24 hr tablet Take 2 tablets (1,000 mg total) by mouth 2 (two) times daily with a meal. 02/21/24   Hawks, Christy A, FNP  oxcarbazepine  (TRILEPTAL ) 600 MG tablet Take 1 tablet (600 mg total) by mouth 3 (three) times daily. 11/07/23   Lavell Lye LABOR, FNP  pantoprazole  (PROTONIX ) 40 MG tablet Take 1 tablet (40 mg total) by mouth daily. 02/08/24   Lavell Lye LABOR, FNP  rOPINIRole  (REQUIP ) 0.25 MG tablet TAKE 1  TO 2 TABLETS BY MOUTH AT BEDTIME FOR  RESTLESS  LEGS 02/08/24   Hawks, Bari A, FNP  rosuvastatin  (CRESTOR ) 5 MG tablet Take 1 tablet (5 mg total) by mouth 3 (three) times a week. 11/08/23   Lavell Bari LABOR, FNP  traMADol  (ULTRAM ) 50 MG tablet Take 1 tablet (50 mg total) by mouth daily. 02/08/24   Lavell Bari LABOR, FNP    Allergies: Farxiga  [dapagliflozin ], Morphine and codeine, Ozempic  (0.25 or 0.5 mg-dose) [semaglutide (0.25 or 0.5mg -dos)], Semaglutide , and Statins    Review of Systems  Respiratory:  Positive for cough.   All other systems reviewed and are negative.   Updated Vital Signs BP 129/84   Pulse 98    Temp 97.6 F (36.4 C) (Oral)   Resp 18   Ht 6' 1 (1.854 m)   Wt 124.7 kg   SpO2 95%   BMI 36.28 kg/m   Physical Exam Vitals and nursing note reviewed.  Constitutional:      General: He is not in acute distress.    Appearance: Normal appearance. He is not ill-appearing.  HENT:     Head: Normocephalic and atraumatic.     Nose: Nose normal.     Mouth/Throat:     Mouth: Mucous membranes are moist.  Eyes:     Extraocular Movements: Extraocular movements intact.     Conjunctiva/sclera: Conjunctivae normal.     Pupils: Pupils are equal, round, and reactive to light.  Cardiovascular:     Rate and Rhythm: Normal rate and regular rhythm.     Pulses: Normal pulses.     Heart sounds: Normal heart sounds. No murmur heard.    No gallop.  Pulmonary:     Effort: Pulmonary effort is normal. No respiratory distress.     Breath sounds: Normal breath sounds. No stridor. No wheezing, rhonchi or rales.  Abdominal:     General: Abdomen is flat. Bowel sounds are normal. There is no distension.     Palpations: Abdomen is soft.     Tenderness: There is no abdominal tenderness. There is no guarding.  Musculoskeletal:        General: Normal range of motion.     Cervical back: Normal range of motion and neck supple. No rigidity or tenderness.  Skin:    General: Skin is warm and dry.  Neurological:     General: No focal deficit present.     Mental Status: He is alert and oriented to person, place, and time. Mental status is at baseline.  Psychiatric:        Mood and Affect: Mood normal.        Behavior: Behavior normal.        Thought Content: Thought content normal.        Judgment: Judgment normal.     (all labs ordered are listed, but only abnormal results are displayed) Labs Reviewed  RESP PANEL BY RT-PCR (RSV, FLU A&B, COVID)  [id number]    EKG: None  Radiology: No results found.   Procedures   Medications Ordered in the ED - No data to display                                   Medical Decision Making Patient is doing well at this time and is stable for discharge home.  Discussed with patient that x-ray demonstrated no signs of acute cardiopulmonary process to include pneumonia.  He was negative for  COVID-19, influenza and RSV.  Vital signs are otherwise stable at this point with no indication for sepsis and no associated hypoxia.  Suspect an acute viral respiratory infection at this point.  Do not suspect antibiotics are warranted at this time.  The need for close follow-up with primary care doctor on outpatient basis was discussed as well as strict turn precautions for any new or worsening symptoms.  Patient voiced understanding and had no additional questions.  Amount and/or Complexity of Data Reviewed Radiology: ordered.        Final diagnoses:  None    ED Discharge Orders     None          Daralene Lonni JONETTA DEVONNA 03/01/24 1209    Towana Ozell BROCKS, MD 03/01/24 1718

## 2024-03-01 NOTE — ED Triage Notes (Signed)
 Pt came in by POV with complaints of congestion that started yesterday. Pt states his wife is currently admitted in the hospital with pneumonia. No other signs and symptoms

## 2024-03-01 NOTE — Discharge Instructions (Signed)
 Please follow-up closely with your primary care doctor on an outpatient basis.  You may continue over-the-counter medications as needed for your symptoms.  Return to the emergency department immediately for any new or worsening symptoms.

## 2024-03-19 DIAGNOSIS — M1811 Unilateral primary osteoarthritis of first carpometacarpal joint, right hand: Secondary | ICD-10-CM | POA: Diagnosis not present

## 2024-03-19 DIAGNOSIS — M79644 Pain in right finger(s): Secondary | ICD-10-CM | POA: Diagnosis not present

## 2024-03-19 DIAGNOSIS — M79645 Pain in left finger(s): Secondary | ICD-10-CM | POA: Diagnosis not present

## 2024-03-22 ENCOUNTER — Ambulatory Visit (INDEPENDENT_AMBULATORY_CARE_PROVIDER_SITE_OTHER)

## 2024-03-22 ENCOUNTER — Ambulatory Visit: Admitting: Family

## 2024-03-22 ENCOUNTER — Encounter: Payer: Self-pay | Admitting: Family

## 2024-03-22 VITALS — BP 112/71 | HR 93 | Ht 73.0 in | Wt 278.0 lb

## 2024-03-22 VITALS — BP 105/68 | HR 87 | Temp 97.6°F | Ht 73.0 in | Wt 282.2 lb

## 2024-03-22 DIAGNOSIS — R300 Dysuria: Secondary | ICD-10-CM | POA: Diagnosis not present

## 2024-03-22 DIAGNOSIS — E1169 Type 2 diabetes mellitus with other specified complication: Secondary | ICD-10-CM | POA: Diagnosis not present

## 2024-03-22 DIAGNOSIS — Z0000 Encounter for general adult medical examination without abnormal findings: Secondary | ICD-10-CM | POA: Diagnosis not present

## 2024-03-22 DIAGNOSIS — R8290 Unspecified abnormal findings in urine: Secondary | ICD-10-CM

## 2024-03-22 DIAGNOSIS — R350 Frequency of micturition: Secondary | ICD-10-CM

## 2024-03-22 DIAGNOSIS — Z7984 Long term (current) use of oral hypoglycemic drugs: Secondary | ICD-10-CM | POA: Diagnosis not present

## 2024-03-22 LAB — URINALYSIS, COMPLETE
Bilirubin, UA: NEGATIVE
Ketones, UA: NEGATIVE
Leukocytes,UA: NEGATIVE
Nitrite, UA: NEGATIVE
Protein,UA: NEGATIVE
RBC, UA: NEGATIVE
Specific Gravity, UA: 1.01 (ref 1.005–1.030)
Urobilinogen, Ur: 0.2 mg/dL (ref 0.2–1.0)
pH, UA: 5.5 (ref 5.0–7.5)

## 2024-03-22 LAB — MICROSCOPIC EXAMINATION
Bacteria, UA: NONE SEEN
RBC, Urine: NONE SEEN /HPF (ref 0–2)
Renal Epithel, UA: NONE SEEN /HPF
WBC, UA: NONE SEEN /HPF (ref 0–5)
Yeast, UA: NONE SEEN

## 2024-03-22 MED ORDER — TIRZEPATIDE 2.5 MG/0.5ML ~~LOC~~ SOAJ: 2.5000 mg | SUBCUTANEOUS | 2 refills | Status: DC

## 2024-03-22 NOTE — Patient Instructions (Signed)
 Matthew Pennington,  Thank you for taking the time for your Medicare Wellness Visit. I appreciate your continued commitment to your health goals. Please review the care plan we discussed, and feel free to reach out if I can assist you further.  Medicare recommends these wellness visits once per year to help you and your care team stay ahead of potential health issues. These visits are designed to focus on prevention, allowing your provider to concentrate on managing your acute and chronic conditions during your regular appointments.  Please note that Annual Wellness Visits do not include a physical exam. Some assessments may be limited, especially if the visit was conducted virtually. If needed, we may recommend a separate in-person follow-up with your provider.  Ongoing Care Seeing your primary care provider every 3 to 6 months helps us  monitor your health and provide consistent, personalized care.   Referrals If a referral was made during today's visit and you haven't received any updates within two weeks, please contact the referred provider directly to check on the status.  Recommended Screenings:  Health Maintenance  Topic Date Due   COVID-19 Vaccine (1) Never done   Medicare Annual Wellness Visit  03/15/2024   Flu Shot  08/27/2024*   DTaP/Tdap/Td vaccine (1 - Tdap) 11/06/2024*   Pneumococcal Vaccine for age over 48 (1 of 2 - PCV) 02/07/2025*   Hepatitis B Vaccine (1 of 3 - 19+ 3-dose series) 02/07/2025*   Zoster (Shingles) Vaccine (1 of 2) 05/09/2025*   Hemoglobin A1C  08/07/2024   Yearly kidney health urinalysis for diabetes  11/06/2024   Complete foot exam   11/06/2024   Eye exam for diabetics  11/08/2024   Yearly kidney function blood test for diabetes  02/07/2025   Cologuard (Stool DNA test)  03/09/2026   Colon Cancer Screening  01/28/2030   Hepatitis C Screening  Completed   HIV Screening  Completed   HPV Vaccine  Aged Out   Meningitis B Vaccine  Aged Out  *Topic was postponed.  The date shown is not the original due date.       03/22/2024   12:32 PM  Advanced Directives  Does Patient Have a Medical Advance Directive? No   Advance Care Planning is important because it: Ensures you receive medical care that aligns with your values, goals, and preferences. Provides guidance to your family and loved ones, reducing the emotional burden of decision-making during critical moments.  Vision: Annual vision screenings are recommended for early detection of glaucoma, cataracts, and diabetic retinopathy. These exams can also reveal signs of chronic conditions such as diabetes and high blood pressure.  Dental: Annual dental screenings help detect early signs of oral cancer, gum disease, and other conditions linked to overall health, including heart disease and diabetes.  Please see the attached documents for additional preventive care recommendations.

## 2024-03-22 NOTE — Progress Notes (Signed)
 Subjective:   Matthew Pennington is a 58 y.o. who presents for a Medicare Wellness preventive visit.  As a reminder, Annual Wellness Visits don't include a physical exam, and some assessments may be limited, especially if this visit is performed virtually. We may recommend an in-person follow-up visit with your provider if needed.  Visit Complete: Virtual I connected with  Matthew Pennington on 03/22/24 by a audio enabled telemedicine application and verified that I am speaking with the correct person using two identifiers.  Patient Location: Home  Provider Location: Office/Clinic  I discussed the limitations of evaluation and management by telemedicine. The patient expressed understanding and agreed to proceed.  Vital Signs: Because this visit was a virtual/telehealth visit, some criteria may be missing or patient reported. Any vitals not documented were not able to be obtained and vitals that have been documented are patient reported.  VideoDeclined- This patient declined Librarian, academic. Therefore the visit was completed with audio only.  Persons Participating in Visit: Patient.  AWV Questionnaire: No: Patient Medicare AWV questionnaire was not completed prior to this visit.  Cardiac Risk Factors include: advanced age (>62men, >67 women);dyslipidemia;hypertension;male gender;obesity (BMI >30kg/m2)     Objective:    Today's Vitals   03/22/24 1221 03/22/24 1223  BP: 112/71   Pulse: 93   Weight: 278 lb (126.1 kg)   Height: 6' 1 (1.854 m)   PainSc:  7    Body mass index is 36.68 kg/m.     03/22/2024   12:32 PM 03/01/2024   10:22 AM 02/09/2024    5:35 AM 04/04/2023    8:49 AM 03/16/2023    2:33 PM 04/20/2022    8:56 AM 03/15/2022    2:18 PM  Advanced Directives  Does Patient Have a Medical Advance Directive? No No No No No No No  Would patient like information on creating a medical advance directive?    No - Patient declined Yes  (MAU/Ambulatory/Procedural Areas - Information given) No - Patient declined No - Patient declined    Current Medications (verified) Outpatient Encounter Medications as of 03/22/2024  Medication Sig   albuterol  (VENTOLIN  HFA) 108 (90 Base) MCG/ACT inhaler Inhale 2 puffs into the lungs every 6 (six) hours as needed (cough).   baclofen  (LIORESAL ) 10 MG tablet Take 1 tablet (10 mg total) by mouth 3 (three) times daily.   Blood Glucose Monitoring Suppl (ONETOUCH VERIO REFLECT) w/Device KIT    diclofenac  Sodium (VOLTAREN ) 1 % GEL Apply 4 g topically 4 (four) times daily.   DULoxetine  (CYMBALTA ) 30 MG capsule Take 1 capsule (30 mg total) by mouth daily.   empagliflozin  (JARDIANCE ) 25 MG TABS tablet Take 1 tablet (25 mg total) by mouth daily before breakfast.   enalapril  (VASOTEC ) 20 MG tablet Take 1 tablet by mouth once daily   ezetimibe  (ZETIA ) 10 MG tablet Take 1 tablet (10 mg total) by mouth daily.   fluticasone  (FLONASE ) 50 MCG/ACT nasal spray Place 2 sprays into both nostrils daily.   gabapentin  (NEURONTIN ) 300 MG capsule Take 1 capsule (300 mg total) by mouth 3 (three) times daily.   glucose blood (ONETOUCH VERIO) test strip Use as instructed to test daily; DX:e11.65   ibuprofen  (ADVIL ) 800 MG tablet Take 1 tablet (800 mg total) by mouth every 8 (eight) hours as needed.   Lancet Devices (SIMPLE DIAGNOSTICS LANCING DEV) MISC Apply topically.   levocetirizine (XYZAL ) 5 MG tablet Take 1 tablet (5 mg total) by mouth every evening.   lidocaine  (LIDODERM )  5 % Place 1 patch onto the skin daily. Remove & Discard patch within 12 hours or as directed by MD   magic mouthwash (nystatin , lidocaine , diphenhydrAMINE , alum & mag hydroxide) suspension Swish and swallow 5 mLs 3 (three) times daily as needed for mouth pain.   metFORMIN  (GLUCOPHAGE -XR) 500 MG 24 hr tablet Take 2 tablets (1,000 mg total) by mouth 2 (two) times daily with a meal.   oxcarbazepine  (TRILEPTAL ) 600 MG tablet Take 1 tablet (600 mg total)  by mouth 3 (three) times daily.   pantoprazole  (PROTONIX ) 40 MG tablet Take 1 tablet (40 mg total) by mouth daily.   rOPINIRole  (REQUIP ) 0.25 MG tablet TAKE 1 TO 2 TABLETS BY MOUTH AT BEDTIME FOR  RESTLESS  LEGS   rosuvastatin  (CRESTOR ) 5 MG tablet Take 1 tablet (5 mg total) by mouth 3 (three) times a week.   traMADol  (ULTRAM ) 50 MG tablet Take 1 tablet (50 mg total) by mouth daily.   No facility-administered encounter medications on file as of 03/22/2024.    Allergies (verified) Farxiga  [dapagliflozin ], Morphine and codeine, Ozempic  (0.25 or 0.5 mg-dose) [semaglutide (0.25 or 0.5mg -dos)], Semaglutide , and Statins   History: Past Medical History:  Diagnosis Date   Diabetes mellitus without complication (HCC)    H/O echocardiogram 09/2017   normal EF   Headache    Hyperlipidemia    Hypertension    Light headedness    OSA on CPAP    Palpitations    Tingling    toes   Trigeminal neuralgia    Past Surgical History:  Procedure Laterality Date   NO PAST SURGERIES     Family History  Problem Relation Age of Onset   Diabetes Mother    Hyperlipidemia Mother    Hypertension Mother    Cancer Father    Lung cancer Father    Heart disease Sister    Social History   Socioeconomic History   Marital status: Married    Spouse name: Matthew Pennington   Number of children: 3   Years of education: Not on file   Highest education level: Bachelor's degree (e.g., BA, AB, BS)  Occupational History   Not on file  Tobacco Use   Smoking status: Never    Passive exposure: Past   Smokeless tobacco: Never  Vaping Use   Vaping status: Never Used  Substance and Sexual Activity   Alcohol use: No   Drug use: No   Sexual activity: Yes    Comment: getting limitted due to his pain  Other Topics Concern   Not on file  Social History Narrative   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   Social Drivers of Corporate investment banker  Strain: Low Risk  (03/22/2024)   Overall Financial Resource Strain (CARDIA)    Difficulty of Paying Living Expenses: Not hard at all  Food Insecurity: No Food Insecurity (03/22/2024)   Hunger Vital Sign    Worried About Running Out of Food in the Last Year: Never true    Ran Out of Food in the Last Year: Never true  Transportation Needs: No Transportation Needs (03/22/2024)   PRAPARE - Administrator, Civil Service (Medical): No    Lack of Transportation (Non-Medical): No  Physical Activity: Inactive (03/22/2024)   Exercise Vital Sign    Days of Exercise per Week: 0 days    Minutes of Exercise per Session: 0 min  Stress:  No Stress Concern Present (03/22/2024)   [employer] of Occupational Health - Occupational Stress Questionnaire    Feeling of Stress: Not at all  Social Connections: Socially Integrated (03/22/2024)   Social Connection and Isolation Panel    Frequency of Communication with Friends and Family: More than three times a week    Frequency of Social Gatherings with Friends and Family: More than three times a week    Attends Religious Services: 1 to 4 times per year    Active Member of [employer] or Organizations: Yes    Attends Banker Meetings: Never    Marital Status: Married    Tobacco Counseling Counseling given: Yes    Clinical Intake:  Pre-visit preparation completed: Yes  Pain : 0-10 (always in in pain due to neuropathy) Pain Score: 7  Pain Type: Neuropathic pain Pain Orientation: Lower Pain Descriptors / Indicators: Aching Pain Onset: More than a month ago Pain Frequency: Constant Pain Relieving Factors: pain meds  Pain Relieving Factors: pain meds  BMI - recorded: 36.68 Nutritional Status: BMI > 30  Obese Nutritional Risks: None Diabetes: Yes  Lab Results  Component Value Date   HGBA1C 8.2 (H) 02/08/2024   HGBA1C 9.0 (H) 11/07/2023   HGBA1C 9.9 (H) 07/28/2023     How often do you need to have someone help you  when you read instructions, pamphlets, or other written materials from your doctor or pharmacy?: 1 - Never  Interpreter Needed?: No  Information entered by :: alia t/cma   Activities of Daily Living     03/22/2024   12:28 PM  In your present state of health, do you have any difficulty performing the following activities:  Hearing? 1  Vision? 0  Difficulty concentrating or making decisions? 1  Walking or climbing stairs? 1  Dressing or bathing? 0  Doing errands, shopping? 1  Comment pt's wife  Quarry manager and eating ? N  Using the Toilet? N  In the past six months, have you accidently leaked urine? Y  Do you have problems with loss of bowel control? N  Managing your Medications? N  Managing your Finances? N  Housekeeping or managing your Housekeeping? N    Patient Care Team: Lavell Bari LABOR, FNP as PCP - General (Family Medicine) Charls Pearla LABOR, MD (Inactive) as PCP - Cardiology (Cardiology) Cindie Carlin POUR, DO as Consulting Physician (Internal Medicine)  I have updated your Care Teams any recent Medical Services you may have received from other providers in the past year.     Assessment:   This is a routine wellness examination for Allin.  Hearing/Vision screen Hearing Screening - Comments:: Pt have a little bit of hearing  Vision Screening - Comments:: Pt wear glasses/pt [facility] in [place], [place]/last ov 2024   Goals Addressed             This Visit's Progress    Have 3 meals a day   On track    Pt would like to drink more water and watch carb intake.     Patient Stated   On track    03/15/2022 AWV Goal: Diabetes Management  Patient will maintain an A1C level below 8.0 Patient will not develop any diabetic foot complications Patient will not experience any hypoglycemic episodes over the next 3 months Patient will notify our office of any CBG readings outside of the provider recommended range by calling [phone] Patient  will adhere to provider recommendations for diabetes management  Patient Self Management Activities take all medications as prescribed and report any negative side effects monitor and record blood sugar readings as directed adhere to a low carbohydrate diet that incorporates lean proteins, vegetables, whole grains, low glycemic fruits check feet daily noting any sores, cracks, injuries, or callous formations see PCP or podiatrist if he notices any changes in his legs, feet, or toenails Patient will visit PCP and have an A1C level checked every 3 to 6 months as directed  have a yearly eye exam to monitor for vascular changes associated with diabetes and will request that the report be sent to his pcp.  consult with his PCP regarding any changes in his health or new or worsening symptoms        Depression Screen     03/22/2024   12:33 PM 02/08/2024   10:48 AM 11/07/2023   10:16 AM 03/30/2023    1:59 PM 03/16/2023    2:32 PM 02/21/2023   11:45 AM 01/02/2023   12:03 PM  PHQ 2/9 Scores  PHQ - 2 Score 4 4 6 4  0 4 4  PHQ- 9 Score 12 14 22 18  0 17 14    Fall Risk     03/22/2024   12:22 PM 02/08/2024   10:47 AM 03/30/2023    1:58 PM 03/16/2023    2:30 PM 02/21/2023   11:45 AM  Fall Risk   Falls in the past year? 1 0 0 0 0  Number falls in past yr: 0 0  0   Injury with Fall? 0 0  0   Risk for fall due to : Impaired balance/gait;Impaired mobility No Fall Risks;Impaired balance/gait  No Fall Risks   Follow up Falls evaluation completed;Education provided Falls evaluation completed  Falls prevention discussed     MEDICARE RISK AT HOME:  Medicare Risk at Home Any stairs in or around the home?: No If so, are there any without handrails?: No Home free of loose throw rugs in walkways, pet beds, electrical cords, etc?: Yes Adequate lighting in your home to reduce risk of falls?: Yes Life alert?: No Use of a cane, walker or w/c?: Yes Grab bars in the bathroom?: Yes Shower chair or bench  in shower?: No Elevated toilet seat or a handicapped toilet?: Yes  TIMED UP AND GO:  Was the test performed?  no  Cognitive Function: 6CIT completed        03/22/2024   12:39 PM 03/16/2023    2:33 PM 03/15/2022    2:23 PM 02/03/2021    4:25 PM  6CIT Screen  What Year? 0 points 0 points 0 points 0 points  What month? 0 points 0 points 0 points 0 points  What time? 0 points 0 points 3 points 0 points  Count back from 20 0 points 0 points 0 points 0 points  Months in reverse 2 points 0 points 4 points 0 points  Repeat phrase 4 points 0 points 0 points 0 points  Total Score 6 points 0 points 7 points 0 points    Immunizations Immunization History  Administered Date(s) Administered   Influenza Split 02/13/2017   Influenza, Seasonal, Injecte, Preservative Fre 02/12/2016, 02/13/2017, 02/20/2018   Influenza,inj,Quad PF,6+ Mos 02/21/2022   Influenza,inj,quad, With Preservative 02/13/2017, 02/20/2018, 02/05/2019   Influenza-Unspecified 01/29/2020, 03/30/2021    Screening Tests Health Maintenance  Topic Date Due   COVID-19 Vaccine (1) Never done   Influenza Vaccine  08/27/2024 (Originally 12/29/2023)   DTaP/Tdap/Td (1 - Tdap) 11/06/2024 (Originally 07/01/1984)  Pneumococcal Vaccine: 50+ Years (1 of 2 - PCV) 02/07/2025 (Originally 07/01/1984)   Hepatitis B Vaccines 19-59 Average Risk (1 of 3 - 19+ 3-dose series) 02/07/2025 (Originally 07/01/1984)   Zoster Vaccines- Shingrix (1 of 2) 05/09/2025 (Originally 07/01/1984)   HEMOGLOBIN A1C  08/07/2024   Diabetic kidney evaluation - Urine ACR  11/06/2024   FOOT EXAM  11/06/2024   OPHTHALMOLOGY EXAM  11/08/2024   Diabetic kidney evaluation - eGFR measurement  02/07/2025   Medicare Annual Wellness (AWV)  03/22/2025   Fecal DNA (Cologuard)  03/09/2026   Colonoscopy  01/28/2030   Hepatitis C Screening  Completed   HIV Screening  Completed   HPV VACCINES  Aged Out   Meningococcal B Vaccine  Aged Out    Health Maintenance Items Addressed: See  Nurse Notes at the end of this note  Additional Screening:  Vision Screening: Recommended annual ophthalmology exams for early detection of glaucoma and other disorders of the eye. Is the patient up to date with their annual eye exam?  Yes  Who is the provider or what is the name of the office in which the patient attends annual eye exams? [facility] in [place], [place]  Dental Screening: Recommended annual dental exams for proper oral hygiene  Community Resource Referral / Chronic Care Management: CRR required this visit?  No   CCM required this visit?  No   Plan:    I have personally reviewed and noted the following in the patient's chart:   Medical and social history Use of alcohol, tobacco or illicit drugs  Current medications and supplements including opioid prescriptions. Patient is not currently taking opioid prescriptions. Functional ability and status Nutritional status Physical activity Advanced directives List of other physicians Hospitalizations, surgeries, and ER visits in previous 12 months Vitals Screenings to include cognitive, depression, and falls Referrals and appointments  In addition, I have reviewed and discussed with patient certain preventive protocols, quality metrics, and best practice recommendations. A written personalized care plan for preventive services as well as general preventive health recommendations were provided to patient.   Ozie Ned, CMA   03/22/2024   After Visit Summary: (MyChart) Due to this being a telephonic visit, the after visit summary with patients personalized plan was offered to patient via MyChart   Notes: Nothing significant to report at this time.

## 2024-03-22 NOTE — Progress Notes (Signed)
 Subjective:    Patient ID: Matthew Pennington, male    DOB: 1965/12/30, 58 y.o.   MRN: [id number]  Chief Complaint  Patient presents with   Acute Visit    Urine odor and back pain   Pt presents to the office today with urine odor and back pain. States he has chronic back pain and is unsure if this is related. He does have urinary frequency, but takes Jardiance .  Urinary Frequency  This is a new problem. The current episode started in the past 7 days. The problem has been unchanged. The pain is at a severity of 0/10. There has been no fever. Associated symptoms include flank pain, frequency and urgency. Pertinent negatives include no hematuria, hesitancy, nausea or vomiting. He has tried increased fluids for the symptoms. The treatment provided mild relief.  Diabetes He presents for his follow-up diabetic visit. He has type 2 diabetes mellitus. Pertinent negatives for diabetes include no blurred vision and no foot paresthesias. Diabetic complications include peripheral neuropathy. Risk factors for coronary artery disease include dyslipidemia, diabetes mellitus, male sex and hypertension. He is following a generally unhealthy diet. His overall blood glucose range is >200 mg/dl.      Review of Systems  Eyes:  Negative for blurred vision.  Gastrointestinal:  Negative for nausea and vomiting.  Genitourinary:  Positive for flank pain, frequency and urgency. Negative for hematuria and hesitancy.  All other systems reviewed and are negative.   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   Social Drivers of Corporate investment banker Strain: Low Risk  (03/22/2024)   Overall Financial Resource Strain (CARDIA)    Difficulty of Paying Living Expenses: Not hard at all  Food Insecurity: No Food Insecurity (03/22/2024)   Hunger Vital Sign    Worried About Running Out of Food in the Last Year: Never true    Ran Out of Food in the Last Year: Never true  Transportation Needs: No Transportation Needs (03/22/2024)   PRAPARE - Administrator, Civil Service (Medical): No    Lack of Transportation (Non-Medical): No  Physical Activity: Inactive (03/22/2024)   Exercise Vital Sign    Days of Exercise per Week: 0 days    Minutes of Exercise per Session: 0 min  Stress: No Stress Concern Present (03/22/2024)   [employer] of Occupational Health - Occupational Stress Questionnaire    Feeling of Stress: Not at all  Social Connections: Socially Integrated (03/22/2024)   Social Connection and Isolation Panel    Frequency of Communication with Friends and Family: More than three times a week    Frequency of Social Gatherings with Friends and Family: More than three times a week  Attends Religious Services: 1 to 4 times per year    Active Member of Clubs or Organizations: Yes    Attends Banker Meetings: Never    Marital Status: Married   Family History  Problem Relation Age of Onset   Diabetes Mother    Hyperlipidemia Mother    Hypertension Mother    Cancer Father    Lung cancer Father    Heart disease Sister         Objective:   Physical Exam Vitals reviewed.  Constitutional:      General: He is not in acute distress.    Appearance: He is well-developed.  HENT:     Head: Normocephalic.     Right Ear: Tympanic  membrane normal.     Left Ear: Tympanic membrane normal.  Eyes:     General:        Right eye: No discharge.        Left eye: No discharge.     Pupils: Pupils are equal, round, and reactive to light.  Neck:     Thyroid : No thyromegaly.  Cardiovascular:     Rate and Rhythm: Normal rate and regular rhythm.     Heart sounds: Normal heart sounds. No murmur heard. Pulmonary:     Effort: Pulmonary effort is normal. No respiratory distress.     Breath sounds: Normal breath sounds. No wheezing.  Abdominal:     General: Bowel sounds are normal. There is no distension.     Palpations: Abdomen is soft.     Tenderness: There is no abdominal tenderness.  Musculoskeletal:        General: No tenderness. Normal range of motion.     Cervical back: Normal range of motion and neck supple.  Skin:    General: Skin is warm and dry.     Findings: No erythema or rash.  Neurological:     Mental Status: He is alert and oriented to person, place, and time.     Cranial Nerves: No cranial nerve deficit.     Deep Tendon Reflexes: Reflexes are normal and symmetric.  Psychiatric:        Behavior: Behavior normal.        Thought Content: Thought content normal.        Judgment: Judgment normal.       BP 98/65   Pulse 87   Temp 97.6 F (36.4 C) (Temporal)   Ht 6' 1 (1.854 m)   Wt 282 lb 3.2 oz (128 kg)   SpO2 94%   BMI 37.23 kg/m      Assessment & Plan:  Matthew Pennington comes in today with chief complaint of Acute Visit (Urine odor and back pain)   Diagnosis and orders addressed:  1. Abnormal urine odor (Primary) - Urinalysis, Complete - Urine Culture  2. Urinary frequency   3. Type 2 diabetes mellitus with other specified complication, without long-term current use of insulin (HCC) Pt's glucose elevated. Had steroid injection last week. Pt on Jardiance  25 mg and has 3+ glucose in urine today.  Strict low carb diet - tirzepatide (MOUNJARO) 2.5 MG/0.5ML Pen; Inject 2.5 mg into the skin  once a week.  Dispense: 2 mL; Refill: 2   Low carb diet Force fluids Continue current medication   [name], FNP

## 2024-03-22 NOTE — Patient Instructions (Signed)

## 2024-03-24 LAB — URINE CULTURE

## 2024-03-25 ENCOUNTER — Ambulatory Visit: Payer: Self-pay | Admitting: Family

## 2024-04-02 ENCOUNTER — Telehealth: Payer: Self-pay | Admitting: Family

## 2024-04-02 DIAGNOSIS — E782 Mixed hyperlipidemia: Secondary | ICD-10-CM

## 2024-04-02 NOTE — Progress Notes (Signed)
 Pharmacy Quality Measure Review  This patient is appearing on a report for being at risk of failing the adherence measure for cholesterol (statin) medications this calendar year.   Medication: Rosuvastatin  5mg  Last fill date: 08/24 for 28 day supply  Has sufficient refills based on current prescription. Attempted to call [phone] phone number x2, answered by I believe his wife who requested call at patient's number. Called [phone], LVM explaining reason for call.   Room for adherence interventions in future, possibly at next PCP appointment. Will reach out to PCP to discuss at appointment scheduled for 05/09/24.  Angela Baalmann, PharmD [facility] Pharmacist

## 2024-04-05 ENCOUNTER — Other Ambulatory Visit: Payer: Self-pay | Admitting: Pharmacist

## 2024-04-05 DIAGNOSIS — E782 Mixed hyperlipidemia: Secondary | ICD-10-CM

## 2024-04-05 DIAGNOSIS — G720 Drug-induced myopathy: Secondary | ICD-10-CM

## 2024-04-29 ENCOUNTER — Ambulatory Visit
Admission: EM | Admit: 2024-04-29 | Discharge: 2024-04-29 | Disposition: A | Discharge status: Home / Self Care | Attending: Nurse Practitioner | Admitting: Nurse Practitioner

## 2024-04-29 DIAGNOSIS — R350 Frequency of micturition: Secondary | ICD-10-CM | POA: Diagnosis not present

## 2024-04-29 DIAGNOSIS — J069 Acute upper respiratory infection, unspecified: Secondary | ICD-10-CM | POA: Diagnosis not present

## 2024-04-29 LAB — POCT URINE DIPSTICK
Bilirubin, UA: NEGATIVE
Blood, UA: NEGATIVE
Glucose, UA: >=1000 mg/dL — AB
Ketones, POC UA: NEGATIVE mg/dL
Leukocytes, UA: NEGATIVE
Nitrite, UA: NEGATIVE
Protein Ur, POC: NEGATIVE mg/dL
Spec Grav, UA: 1.015 (ref 1.010–1.025)
Urobilinogen, UA: 0.2 U/dL
pH, UA: 6.5 (ref 5.0–8.0)

## 2024-04-29 LAB — POC COVID19/FLU A&B COMBO
Covid Antigen, POC: NEGATIVE
Influenza A Antigen, POC: NEGATIVE
Influenza B Antigen, POC: NEGATIVE

## 2024-04-29 MED ORDER — PROMETHAZINE-DM 6.25-15 MG/5ML PO SYRP: 5.0000 mL | ORAL_SOLUTION | Freq: Four times a day (QID) | ORAL | 0 refills | Status: DC | PRN

## 2024-04-29 MED ORDER — FLUTICASONE PROPIONATE 50 MCG/ACT NA SUSP: 2.0000 | Freq: Every day | NASAL | 0 refills | Status: AC

## 2024-04-29 NOTE — ED Provider Notes (Signed)
 [facility]    CSN: [id number] Arrival date & time: 04/29/24  1808      History   Chief Complaint Chief Complaint  Patient presents with   Nasal Congestion   Cough    HPI Matthew Pennington is a 58 y.o. male.   The history is provided by the patient.   Patient presents with a 3-day history of fatigue, cough, runny nose, nasal congestion, and headaches.  Patient denies fever, chills, wheezing, difficulty breathing, abdominal pain, nausea, vomiting, or diarrhea.  Patient states that he was around someone who was coughing.  States he has been taking cough drops, ibuprofen , and drinking green tea..  Patient also reports urinary frequency.  States that he does have a history of recurrent urinary tract infections, states that he is currently taking Jardiance , patient is requesting to have his urine checked.  He denies fever, chills, pain with urination, hematuria, flank pain, low back pain, or decreased urine stream. Past Medical History:  Diagnosis Date   Diabetes mellitus without complication (HCC)    H/O echocardiogram 09/2017   normal EF   Headache    Hyperlipidemia    Hypertension    Light headedness    OSA on CPAP    Palpitations    Tingling    toes   Trigeminal neuralgia     Patient Active Problem List   Diagnosis Date Noted   Pain of left hip 01/02/2023   Fibromyalgia 07/26/2022   Calcific tendonitis 07/26/2022   Polyarthralgia 06/22/2022   Ureteral calculus, right 04/27/2022   Nephrolithiasis 04/27/2022   Osteoarthritis of carpometacarpal (CMC) joint of thumb 04/12/2022   Statin myopathy 03/03/2022   Right lateral epicondylitis 02/11/2022   Left lateral epicondylitis 02/11/2022   Arthritis of carpometacarpal (CMC) joint of right thumb 06/08/2021   Hepatic steatosis 12/24/2019   Gastroesophageal reflux disease 08/23/2019   Controlled substance agreement signed 08/23/2019   Moderate recurrent major depression (HCC) 08/23/2019   OSA (obstructive sleep  apnea) 12/05/2017   GAD (generalized anxiety disorder) 11/24/2017   Leg cramps, sleep related 11/24/2017   Hypertension associated with diabetes (HCC) 03/29/2017   Diabetes mellitus (HCC) 03/29/2017   Hyperlipidemia associated with type 2 diabetes mellitus (HCC) 03/29/2017   Diverticulosis of large intestine without hemorrhage 03/29/2017   History of prostatitis 03/29/2017   Light headedness    Tingling    Weakness    Trigeminal neuralgia     Past Surgical History:  Procedure Laterality Date   NO PAST SURGERIES         Home Medications    Prior to Admission medications   Medication Sig Start Date End Date Taking? Authorizing Provider  fluticasone  (FLONASE ) 50 MCG/ACT nasal spray Place 2 sprays into both nostrils daily. 04/29/24  Yes Leath-Warren, Etta PARAS, NP  promethazine -dextromethorphan (PROMETHAZINE -DM) 6.25-15 MG/5ML syrup Take 5 mLs by mouth 4 (four) times daily as needed. 04/29/24  Yes Leath-Warren, Etta PARAS, NP  albuterol  (VENTOLIN  HFA) 108 (90 Base) MCG/ACT inhaler Inhale 2 puffs into the lungs every 6 (six) hours as needed (cough). 12/21/23   Severa Rock HERO, FNP  baclofen  (LIORESAL ) 10 MG tablet Take 1 tablet (10 mg total) by mouth 3 (three) times daily. 04/07/23   Vivienne Delon HERO, PA-C  Blood Glucose Monitoring Suppl (ONETOUCH VERIO REFLECT) w/Device KIT  02/27/20   [provider]  diclofenac  Sodium (VOLTAREN ) 1 % GEL Apply 4 g topically 4 (four) times daily. 11/07/23   Lavell Bari LABOR, FNP  DULoxetine  (CYMBALTA ) 30 MG capsule  Take 1 capsule (30 mg total) by mouth daily. 02/08/24   Lavell Bari LABOR, FNP  empagliflozin  (JARDIANCE ) 25 MG TABS tablet Take 1 tablet (25 mg total) by mouth daily before breakfast. 02/08/24   Lavell Bari LABOR, FNP  enalapril  (VASOTEC ) 20 MG tablet Take 1 tablet by mouth once daily 01/18/24   Lavell Bari A, FNP  ezetimibe  (ZETIA ) 10 MG tablet Take 1 tablet (10 mg total) by mouth daily. 02/08/24   Lavell Bari A, FNP  gabapentin   (NEURONTIN ) 300 MG capsule Take 1 capsule (300 mg total) by mouth 3 (three) times daily. 02/08/24   Lavell Bari A, FNP  glucose blood (ONETOUCH VERIO) test strip Use as instructed to test daily; DX:e11.65 09/21/23   Lavell Bari A, FNP  ibuprofen  (ADVIL ) 800 MG tablet Take 1 tablet (800 mg total) by mouth every 8 (eight) hours as needed. 02/08/24   Lavell Bari LABOR, FNP  Lancet Devices (SIMPLE DIAGNOSTICS LANCING DEV) MISC Apply topically. 07/26/21   [provider]  levocetirizine (XYZAL ) 5 MG tablet Take 1 tablet (5 mg total) by mouth every evening. 12/21/23   Rakes, Rock HERO, FNP  lidocaine  (LIDODERM ) 5 % Place 1 patch onto the skin daily. Remove & Discard patch within 12 hours or as directed by MD 04/04/23   Kommor, Lum, MD  magic mouthwash (nystatin , lidocaine , diphenhydrAMINE , alum & mag hydroxide) suspension Swish and swallow 5 mLs 3 (three) times daily as needed for mouth pain. 02/09/24   Jerral Meth, MD  metFORMIN  (GLUCOPHAGE -XR) 500 MG 24 hr tablet Take 2 tablets (1,000 mg total) by mouth 2 (two) times daily with a meal. 02/21/24   Hawks, Bari A, FNP  oxcarbazepine  (TRILEPTAL ) 600 MG tablet Take 1 tablet (600 mg total) by mouth 3 (three) times daily. 11/07/23   Lavell Bari A, FNP  pantoprazole  (PROTONIX ) 40 MG tablet Take 1 tablet (40 mg total) by mouth daily. 02/08/24   Lavell Bari LABOR, FNP  rOPINIRole  (REQUIP ) 0.25 MG tablet TAKE 1 TO 2 TABLETS BY MOUTH AT BEDTIME FOR  RESTLESS  LEGS 02/08/24   Hawks, Bari A, FNP  rosuvastatin  (CRESTOR ) 5 MG tablet Take 1 tablet (5 mg total) by mouth 3 (three) times a week. 11/08/23   Lavell Bari A, FNP  tirzepatide  (MOUNJARO ) 2.5 MG/0.5ML Pen Inject 2.5 mg into the skin once a week. 03/22/24   Lavell Bari A, FNP  traMADol  (ULTRAM ) 50 MG tablet Take 1 tablet (50 mg total) by mouth daily. 02/08/24   Lavell Bari LABOR, FNP    Family History Family History  Problem Relation Age of Onset   Diabetes Mother    Hyperlipidemia Mother     Hypertension Mother    Cancer Father    Lung cancer Father    Heart disease Sister     Social History Social History   Tobacco Use   Smoking status: Never    Passive exposure: Past   Smokeless tobacco: Never  Vaping Use   Vaping status: Never Used  Substance Use Topics   Alcohol use: No   Drug use: No     Allergies   Farxiga  [dapagliflozin ], Morphine and codeine, Ozempic  (0.25 or 0.5 mg-dose) [semaglutide (0.25 or 0.5mg -dos)], Semaglutide , and Statins   Review of Systems Review of Systems Per HPI  Physical Exam Triage Vital Signs ED Triage Vitals  Encounter Vitals Group     BP 04/29/24 1834 127/87     Girls Systolic BP Percentile --      Girls Diastolic BP Percentile --  Boys Systolic BP Percentile --      Boys Diastolic BP Percentile --      Pulse Rate 04/29/24 1834 85     Resp 04/29/24 1834 15     Temp 04/29/24 1834 97.7 F (36.5 C)     Temp Source 04/29/24 1834 Oral     SpO2 04/29/24 1834 96 %     Weight --      Height --      Head Circumference --      Peak Flow --      Pain Score 04/29/24 1833 4     Pain Loc --      Pain Education --      Exclude from Growth Chart --    No data found.  Updated Vital Signs BP 127/87   Pulse 85   Temp 97.7 F (36.5 C) (Oral)   Resp 15   SpO2 96%   Visual Acuity Right Eye Distance:   Left Eye Distance:   Bilateral Distance:    Right Eye Near:   Left Eye Near:    Bilateral Near:     Physical Exam Vitals and nursing note reviewed.  Constitutional:      General: He is not in acute distress.    Appearance: Normal appearance.  HENT:     Head: Normocephalic.     Right Ear: Tympanic membrane, ear canal and external ear normal.     Left Ear: Tympanic membrane, ear canal and external ear normal.     Nose: Congestion present.     Mouth/Throat:     Mouth: Mucous membranes are moist.  Eyes:     Extraocular Movements: Extraocular movements intact.     Conjunctiva/sclera: Conjunctivae normal.      Pupils: Pupils are equal, round, and reactive to light.  Cardiovascular:     Rate and Rhythm: Normal rate and regular rhythm.     Pulses: Normal pulses.     Heart sounds: Normal heart sounds.  Pulmonary:     Effort: Pulmonary effort is normal. No respiratory distress.     Breath sounds: Normal breath sounds. No stridor. No wheezing, rhonchi or rales.  Abdominal:     General: Bowel sounds are normal.     Palpations: Abdomen is soft.     Tenderness: There is no abdominal tenderness.  Musculoskeletal:     Cervical back: Normal range of motion.  Skin:    General: Skin is warm and dry.  Neurological:     General: No focal deficit present.     Mental Status: He is alert and oriented to person, place, and time.  Psychiatric:        Mood and Affect: Mood normal.        Behavior: Behavior normal.      UC Treatments / Results  Labs (all labs ordered are listed, but only abnormal results are displayed) Labs Reviewed  POCT URINE DIPSTICK - Abnormal; Notable for the following components:      Result Value   Glucose, UA >=1,000 (*)    All other components within normal limits  POC COVID19/FLU A&B COMBO - Normal    EKG   Radiology No results found.  Procedures Procedures (including critical care time)  Medications Ordered in UC Medications - No data to display  Initial Impression / Assessment and Plan / UC Course  I have reviewed the triage vital signs and the nursing notes.  Pertinent labs & imaging results that were available during my care of  the patient were reviewed by me and considered in my medical decision making (see chart for details).  COVID/flu test was negative.  On exam, the patient's lung sounds are clear throughout, room air sats are at 96%.  The patient is well-appearing, is in no acute distress, vital signs are stable.  Symptoms consistent with a viral URI with cough.  Will provide symptomatic treatment with fluticasone  50 mcg nasal spray and Promethazine  DM  for the cough.  Supportive care recommendations were provided and discussed with the patient to include over-the-counter analgesics, fluids, rest, and use of a humidifier during sleep.  With regard to his urinalysis, urinalysis was negative for urinary tract infection.  Patient advised to follow-up with his primary care for continued monitoring of his urinary symptoms.  Patient was in agreement with this plan of care and verbalizes understanding.  All questions were answered.  Patient stable for discharge.  Final Clinical Impressions(s) / UC Diagnoses   Final diagnoses:  Urinary frequency  Viral URI with cough     Discharge Instructions      The COVID/flu test was negative. Take medication as prescribed. You may take over-the-counter Tylenol  as needed for pain, fever, or general discomfort. Recommend the use of normal saline nasal spray throughout the day for nasal congestion or runny nose. For your cough, recommend the use of a humidifier in your bedroom at nighttime during sleep and sleeping elevated on pillows while symptoms persist. Symptoms should begin to improve over the next 5 to 7 days.  If symptoms fail to improve, or begin to worsen, you may follow-up in this clinic or with your primary care physician for further evaluation.  For your urinary frequency: The urinalysis was negative. Continue to follow-up with your primary care physician for further monitoring of your urinary symptoms.  Follow-up as needed.     ED Prescriptions     Medication Sig Dispense Auth. Provider   promethazine -dextromethorphan (PROMETHAZINE -DM) 6.25-15 MG/5ML syrup Take 5 mLs by mouth 4 (four) times daily as needed. 118 mL Leath-Warren, Etta PARAS, NP   fluticasone  (FLONASE ) 50 MCG/ACT nasal spray Place 2 sprays into both nostrils daily. 16 g Leath-Warren, Etta PARAS, NP      PDMP not reviewed this encounter.   Gilmer Etta PARAS, NP 04/29/24 1924

## 2024-04-29 NOTE — ED Triage Notes (Signed)
 Pt present with c/o runny nose, cough and headaches x 3 days. Reports symptoms worsening and having trouble sleeping at night.  States he has had cough drops, ibuprofen  and green tea.  States he has been feeling achy.

## 2024-04-29 NOTE — Discharge Instructions (Addendum)
 The COVID/flu test was negative. Take medication as prescribed. You may take over-the-counter Tylenol  as needed for pain, fever, or general discomfort. Recommend the use of normal saline nasal spray throughout the day for nasal congestion or runny nose. For your cough, recommend the use of a humidifier in your bedroom at nighttime during sleep and sleeping elevated on pillows while symptoms persist. Symptoms should begin to improve over the next 5 to 7 days.  If symptoms fail to improve, or begin to worsen, you may follow-up in this clinic or with your primary care physician for further evaluation.  For your urinary frequency: The urinalysis was negative. Continue to follow-up with your primary care physician for further monitoring of your urinary symptoms.  Follow-up as needed.

## 2024-05-01 ENCOUNTER — Other Ambulatory Visit: Payer: Self-pay | Admitting: *Deleted

## 2024-05-01 DIAGNOSIS — E1169 Type 2 diabetes mellitus with other specified complication: Secondary | ICD-10-CM

## 2024-05-01 MED ORDER — ROSUVASTATIN CALCIUM 5 MG PO TABS: 5.0000 mg | ORAL_TABLET | ORAL | 0 refills | Status: AC

## 2024-05-09 ENCOUNTER — Ambulatory Visit: Payer: Self-pay | Admitting: Family

## 2024-05-09 ENCOUNTER — Encounter: Payer: Self-pay | Admitting: Family

## 2024-05-09 VITALS — BP 112/74 | HR 80 | Temp 98.2°F | Ht 73.0 in | Wt 277.4 lb

## 2024-05-09 DIAGNOSIS — K219 Gastro-esophageal reflux disease without esophagitis: Secondary | ICD-10-CM

## 2024-05-09 DIAGNOSIS — E1159 Type 2 diabetes mellitus with other circulatory complications: Secondary | ICD-10-CM | POA: Diagnosis not present

## 2024-05-09 DIAGNOSIS — R350 Frequency of micturition: Secondary | ICD-10-CM

## 2024-05-09 DIAGNOSIS — Z79899 Other long term (current) drug therapy: Secondary | ICD-10-CM | POA: Diagnosis not present

## 2024-05-09 DIAGNOSIS — G8929 Other chronic pain: Secondary | ICD-10-CM

## 2024-05-09 DIAGNOSIS — I152 Hypertension secondary to endocrine disorders: Secondary | ICD-10-CM

## 2024-05-09 DIAGNOSIS — M150 Primary generalized (osteo)arthritis: Secondary | ICD-10-CM | POA: Diagnosis not present

## 2024-05-09 DIAGNOSIS — F411 Generalized anxiety disorder: Secondary | ICD-10-CM | POA: Diagnosis not present

## 2024-05-09 DIAGNOSIS — G4733 Obstructive sleep apnea (adult) (pediatric): Secondary | ICD-10-CM | POA: Diagnosis not present

## 2024-05-09 DIAGNOSIS — E1169 Type 2 diabetes mellitus with other specified complication: Secondary | ICD-10-CM | POA: Diagnosis not present

## 2024-05-09 DIAGNOSIS — G500 Trigeminal neuralgia: Secondary | ICD-10-CM

## 2024-05-09 DIAGNOSIS — T466X5A Adverse effect of antihyperlipidemic and antiarteriosclerotic drugs, initial encounter: Secondary | ICD-10-CM

## 2024-05-09 DIAGNOSIS — F331 Major depressive disorder, recurrent, moderate: Secondary | ICD-10-CM | POA: Diagnosis not present

## 2024-05-09 DIAGNOSIS — G720 Drug-induced myopathy: Secondary | ICD-10-CM | POA: Diagnosis not present

## 2024-05-09 LAB — MICROSCOPIC EXAMINATION
Bacteria, UA: NONE SEEN
RBC, Urine: NONE SEEN /HPF (ref 0–2)
Renal Epithel, UA: NONE SEEN /HPF
WBC, UA: NONE SEEN /HPF (ref 0–5)
Yeast, UA: NONE SEEN

## 2024-05-09 LAB — BAYER DCA HB A1C WAIVED: HB A1C (BAYER DCA - WAIVED): 8.2 % — ABNORMAL HIGH (ref 4.8–5.6)

## 2024-05-09 LAB — URINALYSIS, COMPLETE
Bilirubin, UA: NEGATIVE
Ketones, UA: NEGATIVE
Leukocytes,UA: NEGATIVE
Nitrite, UA: NEGATIVE
Protein,UA: NEGATIVE
RBC, UA: NEGATIVE
Specific Gravity, UA: 1.01 (ref 1.005–1.030)
Urobilinogen, Ur: 0.2 mg/dL (ref 0.2–1.0)
pH, UA: 5.5 (ref 5.0–7.5)

## 2024-05-09 MED ORDER — TRAMADOL HCL 50 MG PO TABS: 50.0000 mg | ORAL_TABLET | Freq: Every day | ORAL | 2 refills | Status: AC

## 2024-05-09 NOTE — Patient Instructions (Signed)

## 2024-05-09 NOTE — Progress Notes (Signed)
 Subjective:    Patient ID: Matthew Pennington, male    DOB: April 21, 1966, 58 y.o.   MRN: [id number]  Chief Complaint  Patient presents with   Medical Management of Chronic Issues    What's an echo at the doctor in [place]    Nocturia   Pt presents to the office today for chronic follow up.   He is followed by Neurologists for Trigeminal neuralgia as needed.   He does take Ultram  as needed for chronic back pain. He only takes this 1-2 times a month. He never mixes the two.    He has OSA and uses CPAP nightly.   Complaining fatigue.    He is having right hand weakness and is followed by Ortho.    Reports statins causes muscle pain and can not tolerate.  Hypertension This is a chronic problem. The current episode started more than 1 year ago. The problem has been resolved since onset. The problem is controlled. Associated symptoms include malaise/fatigue. Pertinent negatives include no blurred vision or peripheral edema. Risk factors for coronary artery disease include obesity, dyslipidemia, male gender and sedentary lifestyle. The current treatment provides moderate improvement.  Gastroesophageal Reflux He complains of belching and heartburn. This is a chronic problem. The current episode started more than 1 year ago. The problem occurs occasionally. The symptoms are aggravated by certain foods. Associated symptoms include fatigue. Risk factors include obesity. He has tried a PPI for the symptoms. The treatment provided moderate relief.  Hyperlipidemia This is a chronic problem. The current episode started more than 1 year ago. The problem is uncontrolled. Exacerbating diseases include obesity. Current antihyperlipidemic treatment includes diet change, ezetimibe  and statins. The current treatment provides mild improvement of lipids. Risk factors for coronary artery disease include dyslipidemia, diabetes mellitus, hypertension, male sex, a sedentary lifestyle and obesity.  Diabetes He presents for  his follow-up diabetic visit. He has type 2 diabetes mellitus. Associated symptoms include fatigue and foot paresthesias. Pertinent negatives for diabetes include no blurred vision. Diabetic complications include peripheral neuropathy. Risk factors for coronary artery disease include dyslipidemia, diabetes mellitus, hypertension, male sex, sedentary lifestyle and obesity. He is following a generally unhealthy diet. His overall blood glucose range is 140-180 mg/dl. Eye exam is not current.  Arthritis Presents for follow-up visit. He complains of pain and stiffness. Affected locations include the left knee, right knee, left MCP, right MCP, right shoulder and left shoulder. His pain is at a severity of 6/10. Associated symptoms include fatigue.  Depression        This is a chronic problem.  The current episode started more than 1 year ago.   The onset quality is gradual.   The problem occurs intermittently.  Associated symptoms include decreased concentration, fatigue, helplessness, hopelessness and sad.  Past treatments include SNRIs - Serotonin and norepinephrine reuptake inhibitors. Urinary Frequency  This is a recurrent problem. The current episode started 1 to 4 weeks ago. The problem occurs intermittently. Associated symptoms include frequency. Associated symptoms comments: nocturia.    Current opioids rx- Ultram  50 mg # meds rx- 20 Effectiveness of current meds-stable Adverse reactions from pain meds-constipation Morphine equivalent- 5  Pill count performed-No Last drug screen - 07/28/23 ( high risk q51m, moderate risk q13m, low risk yearly ) Urine drug screen today- No Was the [facility] reviewed- yes  If yes were their any concerning findings? - Does get oxycodone , does not take Ultram  with the oxycodone      08/23/2019   10:44 AM  Opioid  Risk   Alcohol 0   Illegal Drugs 0  Rx Drugs 0  Alcohol 0  Illegal Drugs 0  Rx Drugs 0  Age between 16-45 years  1  Psychological Disease 0   Depression 1  Opioid Risk Tool Scoring 2  Opioid Risk Interpretation Low Risk     Data saved with a previous flowsheet row definition     Pain contract signed on: 07/28/23    Review of Systems  Constitutional:  Positive for fatigue and malaise/fatigue.  Eyes:  Negative for blurred vision.  Gastrointestinal:  Positive for heartburn.  Genitourinary:  Positive for frequency.  Musculoskeletal:  Positive for stiffness.  Psychiatric/Behavioral:  Positive for decreased concentration.   All other systems reviewed and are negative.   Family History  Problem Relation Age of Onset   Diabetes Mother    Hyperlipidemia Mother    Hypertension Mother    Cancer Father    Lung cancer Father    Heart disease Sister    Social History   Socioeconomic History   Marital status: Married    Spouse name: Matthew Pennington   Number of children: 3   Years of education: Not on file   Highest education level: Bachelor's degree (e.g., BA, AB, BS)  Occupational History   Not on file  Tobacco Use   Smoking status: Never    Passive exposure: Past   Smokeless tobacco: Never  Vaping Use   Vaping status: Never Used  Substance and Sexual Activity   Alcohol use: No   Drug use: No   Sexual activity: Yes    Comment: getting limitted due to his pain  Other Topics Concern   Not on file  Social History Narrative   Patient is retired midwife. Lives with wife   Caffeine coffee 1 cup   3 sons, live near by.   Social Drivers of Health   Tobacco Use: Low Risk (05/09/2024)   Patient History    Smoking Tobacco Use: Never    Smokeless Tobacco Use: Never    Passive Exposure: Past  Financial Resource Strain: Low Risk (03/22/2024)   Overall Financial Resource Strain (CARDIA)    Difficulty of Paying Living Expenses: Not hard at all  Food Insecurity: No Food Insecurity (03/22/2024)   Epic    Worried About Programme Researcher, Broadcasting/film/video in the Last Year: Never true    Ran Out of Food in the Last Year: Never true   Transportation Needs: No Transportation Needs (03/22/2024)   Epic    Lack of Transportation (Medical): No    Lack of Transportation (Non-Medical): No  Physical Activity: Inactive (03/22/2024)   Exercise Vital Sign    Days of Exercise per Week: 0 days    Minutes of Exercise per Session: 0 min  Stress: No Stress Concern Present (03/22/2024)   [employer] of Occupational Health - Occupational Stress Questionnaire    Feeling of Stress: Not at all  Social Connections: Socially Integrated (03/22/2024)   Social Connection and Isolation Panel    Frequency of Communication with Friends and Family: More than three times a week    Frequency of Social Gatherings with Friends and Family: More than three times a week    Attends Religious Services: 1 to 4 times per year    Active Member of [employer] or Organizations: Yes    Attends Banker Meetings: Never    Marital Status: Married  Depression (PHQ2-9): High Risk (03/22/2024)   Depression (PHQ2-9)    PHQ-2 Score: 12  Alcohol Screen: Low Risk (03/22/2024)   Alcohol Screen    Last Alcohol Screening Score (AUDIT): 0  Housing: Unknown (03/22/2024)   Epic    Unable to Pay for Housing in the Last Year: No    Number of Times Moved in the Last Year: Not on file    Homeless in the Last Year: No  Utilities: Not At Risk (03/22/2024)   Epic    Threatened with loss of utilities: No  Health Literacy: Adequate Health Literacy (03/22/2024)   B1300 Health Literacy    Frequency of need for help with medical instructions: Never       Objective:   Physical Exam Vitals reviewed.  Constitutional:      General: He is not in acute distress.    Appearance: He is well-developed. He is obese.  HENT:     Head: Normocephalic.     Right Ear: Tympanic membrane normal.     Left Ear: Tympanic membrane normal.     Nose: Nose normal.  Eyes:     General:        Right eye: No discharge.        Left eye: No discharge.     Pupils: Pupils are equal,  round, and reactive to light.  Neck:     Thyroid : No thyromegaly.  Cardiovascular:     Rate and Rhythm: Normal rate and regular rhythm.     Heart sounds: Normal heart sounds. No murmur heard. Pulmonary:     Effort: Pulmonary effort is normal. No respiratory distress.     Breath sounds: Normal breath sounds. No wheezing.  Abdominal:     General: Bowel sounds are normal. There is no distension.     Palpations: Abdomen is soft.     Tenderness: There is no abdominal tenderness.  Musculoskeletal:        General: No tenderness.     Cervical back: Normal range of motion and neck supple.     Comments: Pain in lumbar with flexion and extension  Skin:    General: Skin is warm and dry.     Findings: No erythema or rash.  Neurological:     Mental Status: He is alert and oriented to person, place, and time.     Cranial Nerves: No cranial nerve deficit.     Deep Tendon Reflexes: Reflexes are normal and symmetric.  Psychiatric:        Behavior: Behavior normal.        Thought Content: Thought content normal.        Judgment: Judgment normal.      BP 112/74   Pulse 80   Temp 98.2 F (36.8 C) (Temporal)   Ht 6' 1 (1.854 m)   Wt 277 lb 6.4 oz (125.8 kg)   SpO2 93%   BMI 36.60 kg/m      Assessment & Plan:   Destyn Schuyler comes in today with chief complaint of Medical Management of Chronic Issues (What's an echo at the doctor in [place] ) and Nocturia   Diagnosis and orders addressed:  1. Urine frequency (Primary) - Urinalysis, Complete - CMP14+EGFR - CBC with Differential/Platelet  2. Obesity, morbid (HCC) - CMP14+EGFR - CBC with Differential/Platelet  3. Moderate recurrent major depression (HCC) - CMP14+EGFR - CBC with Differential/Platelet  4. Type 2 diabetes mellitus with other specified complication, without long-term current use of insulin (HCC) - Ambulatory referral to Cardiology - CMP14+EGFR - CBC with Differential/Platelet - Bayer DCA Hb A1c Waived  5.  Controlled  substance agreement signed  - CMP14+EGFR - CBC with Differential/Platelet  6. Hypertension associated with diabetes (HCC) - Ambulatory referral to Cardiology - CMP14+EGFR - CBC with Differential/Platelet  7. GAD (generalized anxiety disorder)  - CMP14+EGFR - CBC with Differential/Platelet  8. Gastroesophageal reflux disease without esophagitis - CMP14+EGFR - CBC with Differential/Platelet  9. Statin myopathy  - CMP14+EGFR - CBC with Differential/Platelet  10. OSA (obstructive sleep apnea) - Ambulatory referral to Cardiology - CMP14+EGFR - CBC with Differential/Platelet  11. Primary osteoarthritis involving multiple joints  - CMP14+EGFR - CBC with Differential/Platelet - traMADol  (ULTRAM ) 50 MG tablet; Take 1 tablet (50 mg total) by mouth daily.  Dispense: 20 tablet; Refill: 2  12. Trigeminal neuralgia - traMADol  (ULTRAM ) 50 MG tablet; Take 1 tablet (50 mg total) by mouth daily.  Dispense: 20 tablet; Refill: 2  13. Chronic bilateral low back pain without sciatica  - traMADol  (ULTRAM ) 50 MG tablet; Take 1 tablet (50 mg total) by mouth daily.  Dispense: 20 tablet; Refill: 2    Labs pending Continue current medications  Keep specialists follow up Patient reviewed in [place] controlled database, no flags noted. Contract and drug screen are up to date.  Stress management  Health Maintenance reviewed Diet and exercise encouraged  Follow up plan: 3 months    [name], FNP

## 2024-05-10 ENCOUNTER — Ambulatory Visit: Payer: Self-pay | Admitting: Family

## 2024-05-10 ENCOUNTER — Other Ambulatory Visit: Payer: Self-pay | Admitting: Family

## 2024-05-10 DIAGNOSIS — E1169 Type 2 diabetes mellitus with other specified complication: Secondary | ICD-10-CM

## 2024-05-10 LAB — CMP14+EGFR
ALT: 59 IU/L — ABNORMAL HIGH (ref 0–44)
AST: 38 IU/L (ref 0–40)
Albumin: 4.8 g/dL (ref 3.8–4.9)
Alkaline Phosphatase: 88 IU/L (ref 47–123)
BUN/Creatinine Ratio: 15 (ref 9–20)
BUN: 14 mg/dL (ref 6–24)
Bilirubin Total: 0.3 mg/dL (ref 0.0–1.2)
CO2: 18 mmol/L — ABNORMAL LOW (ref 20–29)
Calcium: 10.2 mg/dL (ref 8.7–10.2)
Chloride: 102 mmol/L (ref 96–106)
Creatinine, Ser: 0.95 mg/dL (ref 0.76–1.27)
Globulin, Total: 2.7 g/dL (ref 1.5–4.5)
Glucose: 195 mg/dL — ABNORMAL HIGH (ref 70–99)
Potassium: 4.9 mmol/L (ref 3.5–5.2)
Sodium: 138 mmol/L (ref 134–144)
Total Protein: 7.5 g/dL (ref 6.0–8.5)
eGFR: 93 mL/min/1.73 (ref >59)

## 2024-05-10 LAB — CBC WITH DIFFERENTIAL/PLATELET
Basophils Absolute: 0.1 x10E3/uL (ref 0.0–0.2)
Basos: 1 %
EOS (ABSOLUTE): 0.2 x10E3/uL (ref 0.0–0.4)
Eos: 2 %
Hematocrit: 52.1 % — ABNORMAL HIGH (ref 37.5–51.0)
Hemoglobin: 17 g/dL (ref 13.0–17.7)
Immature Grans (Abs): 0 x10E3/uL (ref 0.0–0.1)
Immature Granulocytes: 0 %
Lymphocytes Absolute: 3.6 x10E3/uL — ABNORMAL HIGH (ref 0.7–3.1)
Lymphs: 44 %
MCH: 29.9 pg (ref 26.6–33.0)
MCHC: 32.6 g/dL (ref 31.5–35.7)
MCV: 92 fL (ref 79–97)
Monocytes Absolute: 0.6 x10E3/uL (ref 0.1–0.9)
Monocytes: 7 %
Neutrophils Absolute: 3.8 x10E3/uL (ref 1.4–7.0)
Neutrophils: 45 %
Platelets: 324 x10E3/uL (ref 150–450)
RBC: 5.69 x10E6/uL (ref 4.14–5.80)
RDW: 13.1 % (ref 11.6–15.4)
WBC: 8.3 x10E3/uL (ref 3.4–10.8)

## 2024-05-10 MED ORDER — TRESIBA FLEXTOUCH 200 UNIT/ML ~~LOC~~ SOPN: 6.0000 [IU] | PEN_INJECTOR | Freq: Every day | SUBCUTANEOUS | 1 refills | Status: AC

## 2024-06-12 NOTE — Telephone Encounter (Signed)
 Application denied due to missing info not received in a timely manner:

## 2024-06-13 ENCOUNTER — Ambulatory Visit: Admitting: Family

## 2024-06-14 ENCOUNTER — Ambulatory Visit: Admitting: Family

## 2024-06-19 ENCOUNTER — Telehealth: Payer: Self-pay

## 2024-06-19 NOTE — Telephone Encounter (Signed)
 Copied from CRM [id number]. Topic: Appointments - Transfer of Care >> Jun 19, 2024 10:46 AM Joesph B wrote: Pt is requesting to transfer FROM: HAWKS, CHRISTY A Pt is requesting to transfer TO: emily wheeler Reason for requested transfer: htoo far of a drive It is the responsibility of the team the patient would like to transfer to (Dr. Damien wheeler) to reach out to the patient if for any reason this transfer is not acceptable.

## 2024-06-19 NOTE — Telephone Encounter (Signed)
Noted  -LS

## 2024-06-26 ENCOUNTER — Other Ambulatory Visit: Payer: Self-pay

## 2024-06-26 ENCOUNTER — Emergency Department (HOSPITAL_COMMUNITY)

## 2024-06-26 ENCOUNTER — Encounter (HOSPITAL_COMMUNITY): Payer: Self-pay

## 2024-06-26 ENCOUNTER — Emergency Department (HOSPITAL_COMMUNITY)
Admission: EM | Admit: 2024-06-26 | Discharge: 2024-06-26 | Disposition: A | Discharge status: Home / Self Care | Attending: Emergency Medicine | Admitting: Emergency Medicine

## 2024-06-26 DIAGNOSIS — Z5321 Procedure and treatment not carried out due to patient leaving prior to being seen by health care provider: Secondary | ICD-10-CM | POA: Diagnosis not present

## 2024-06-26 DIAGNOSIS — Y9301 Activity, walking, marching and hiking: Secondary | ICD-10-CM | POA: Insufficient documentation

## 2024-06-26 DIAGNOSIS — M25561 Pain in right knee: Secondary | ICD-10-CM | POA: Diagnosis present

## 2024-06-26 DIAGNOSIS — W000XXA Fall on same level due to ice and snow, initial encounter: Secondary | ICD-10-CM | POA: Insufficient documentation

## 2024-06-26 DIAGNOSIS — M545 Low back pain, unspecified: Secondary | ICD-10-CM | POA: Diagnosis not present

## 2024-06-26 DIAGNOSIS — M25521 Pain in right elbow: Secondary | ICD-10-CM | POA: Diagnosis not present

## 2024-06-26 NOTE — ED Triage Notes (Addendum)
 Pt slipped on ice walking to car, c/o right side knee pain and right elbow and low back are sore.

## 2024-07-01 ENCOUNTER — Encounter (HOSPITAL_COMMUNITY): Payer: Self-pay | Admitting: Emergency Medicine

## 2024-07-01 ENCOUNTER — Emergency Department (HOSPITAL_COMMUNITY)

## 2024-07-01 ENCOUNTER — Emergency Department (HOSPITAL_COMMUNITY)
Admission: EM | Admit: 2024-07-01 | Discharge: 2024-07-01 | Disposition: A | Discharge status: Home / Self Care | Attending: Emergency Medicine | Admitting: Emergency Medicine

## 2024-07-01 ENCOUNTER — Other Ambulatory Visit: Payer: Self-pay

## 2024-07-01 DIAGNOSIS — I10 Essential (primary) hypertension: Secondary | ICD-10-CM | POA: Insufficient documentation

## 2024-07-01 DIAGNOSIS — Z7984 Long term (current) use of oral hypoglycemic drugs: Secondary | ICD-10-CM | POA: Insufficient documentation

## 2024-07-01 DIAGNOSIS — R509 Fever, unspecified: Secondary | ICD-10-CM | POA: Insufficient documentation

## 2024-07-01 DIAGNOSIS — M545 Low back pain, unspecified: Secondary | ICD-10-CM | POA: Insufficient documentation

## 2024-07-01 DIAGNOSIS — Z794 Long term (current) use of insulin: Secondary | ICD-10-CM | POA: Insufficient documentation

## 2024-07-01 DIAGNOSIS — E119 Type 2 diabetes mellitus without complications: Secondary | ICD-10-CM | POA: Insufficient documentation

## 2024-07-01 DIAGNOSIS — Z79899 Other long term (current) drug therapy: Secondary | ICD-10-CM | POA: Insufficient documentation

## 2024-07-01 DIAGNOSIS — R350 Frequency of micturition: Secondary | ICD-10-CM | POA: Insufficient documentation

## 2024-07-01 LAB — CBC WITH DIFFERENTIAL/PLATELET
Abs Immature Granulocytes: 0.03 10*3/uL (ref 0.00–0.07)
Basophils Absolute: 0.1 10*3/uL (ref 0.0–0.1)
Basophils Relative: 1 %
Eosinophils Absolute: 0.1 10*3/uL (ref 0.0–0.5)
Eosinophils Relative: 2 %
HCT: 42.7 % (ref 39.0–52.0)
Hemoglobin: 14.9 g/dL (ref 13.0–17.0)
Immature Granulocytes: 1 %
Lymphocytes Relative: 57 %
Lymphs Abs: 3.7 10*3/uL (ref 0.7–4.0)
MCH: 29.7 pg (ref 26.0–34.0)
MCHC: 34.9 g/dL (ref 30.0–36.0)
MCV: 85.2 fL (ref 80.0–100.0)
Monocytes Absolute: 0.6 10*3/uL (ref 0.1–1.0)
Monocytes Relative: 9 %
Neutro Abs: 1.9 10*3/uL (ref 1.7–7.7)
Neutrophils Relative %: 30 %
Platelets: 197 10*3/uL (ref 150–400)
RBC: 5.01 MIL/uL (ref 4.22–5.81)
RDW: 13.2 % (ref 11.5–15.5)
WBC: 6.4 10*3/uL (ref 4.0–10.5)
nRBC: 0 % (ref 0.0–0.2)

## 2024-07-01 LAB — COMPREHENSIVE METABOLIC PANEL WITH GFR
ALT: 39 U/L (ref 0–44)
AST: 27 U/L (ref 15–41)
Albumin: 3.8 g/dL (ref 3.5–5.0)
Alkaline Phosphatase: 85 U/L (ref 38–126)
Anion gap: 14 (ref 5–15)
BUN: 13 mg/dL (ref 6–20)
CO2: 20 mmol/L — ABNORMAL LOW (ref 22–32)
Calcium: 8.9 mg/dL (ref 8.9–10.3)
Chloride: 99 mmol/L (ref 98–111)
Creatinine, Ser: 0.97 mg/dL (ref 0.61–1.24)
GFR, Estimated: >60 mL/min
Glucose, Bld: 232 mg/dL — ABNORMAL HIGH (ref 70–99)
Potassium: 4.3 mmol/L (ref 3.5–5.1)
Sodium: 134 mmol/L — ABNORMAL LOW (ref 135–145)
Total Bilirubin: 0.6 mg/dL (ref 0.0–1.2)
Total Protein: 6.8 g/dL (ref 6.5–8.1)

## 2024-07-01 LAB — RESP PANEL BY RT-PCR (RSV, FLU A&B, COVID)  RVPGX2
Influenza A by PCR: NEGATIVE
Influenza B by PCR: NEGATIVE
Resp Syncytial Virus by PCR: NEGATIVE
SARS Coronavirus 2 by RT PCR: NEGATIVE

## 2024-07-01 LAB — URINALYSIS, W/ REFLEX TO CULTURE (INFECTION SUSPECTED)
Bacteria, UA: NONE SEEN
Bilirubin Urine: NEGATIVE
Glucose, UA: >=500 mg/dL — AB
Hgb urine dipstick: NEGATIVE
Ketones, ur: NEGATIVE mg/dL
Leukocytes,Ua: NEGATIVE
Nitrite: NEGATIVE
Protein, ur: NEGATIVE mg/dL
Specific Gravity, Urine: 1.03 (ref 1.005–1.030)
pH: 5 (ref 5.0–8.0)

## 2024-07-01 LAB — MAGNESIUM: Magnesium: 2.2 mg/dL (ref 1.7–2.4)

## 2024-07-01 LAB — LACTIC ACID, PLASMA: Lactic Acid, Venous: 1.6 mmol/L (ref 0.5–1.9)

## 2024-07-01 MED ORDER — LACTATED RINGERS IV BOLUS (SEPSIS): 1000.0000 mL | Freq: Once | INTRAVENOUS | Status: DC

## 2024-07-01 NOTE — ED Notes (Signed)
 Patient transported to CT

## 2024-07-05 ENCOUNTER — Emergency Department (HOSPITAL_COMMUNITY)

## 2024-07-05 ENCOUNTER — Other Ambulatory Visit: Payer: Self-pay

## 2024-07-05 ENCOUNTER — Emergency Department (HOSPITAL_COMMUNITY)
Admission: EM | Admit: 2024-07-05 | Discharge: 2024-07-05 | Disposition: A | Discharge status: Home / Self Care | Source: Home / Self Care | Attending: Emergency Medicine | Admitting: Emergency Medicine

## 2024-07-05 DIAGNOSIS — R739 Hyperglycemia, unspecified: Secondary | ICD-10-CM

## 2024-07-05 LAB — CBC WITH DIFFERENTIAL/PLATELET
Basophils Absolute: 0 10*3/uL (ref 0.0–0.1)
Basophils Relative: 0 %
Eosinophils Absolute: 0.2 10*3/uL (ref 0.0–0.5)
Eosinophils Relative: 2 %
HCT: 45.2 % (ref 39.0–52.0)
Hemoglobin: 15.2 g/dL (ref 13.0–17.0)
Lymphocytes Relative: 64 %
Lymphs Abs: 5.2 10*3/uL — ABNORMAL HIGH (ref 0.7–4.0)
MCH: 29.4 pg (ref 26.0–34.0)
MCHC: 33.6 g/dL (ref 30.0–36.0)
MCV: 87.4 fL (ref 80.0–100.0)
Monocytes Absolute: 0.7 10*3/uL (ref 0.1–1.0)
Monocytes Relative: 9 %
Neutro Abs: 2.1 10*3/uL (ref 1.7–7.7)
Neutrophils Relative %: 25 %
Platelets: 220 10*3/uL (ref 150–400)
RBC: 5.17 MIL/uL (ref 4.22–5.81)
RDW: 13.4 % (ref 11.5–15.5)
Smear Review: NORMAL
WBC: 8.2 10*3/uL (ref 4.0–10.5)
nRBC: 0 % (ref 0.0–0.2)

## 2024-07-05 LAB — CK: Total CK: 64 U/L (ref 49–397)

## 2024-07-05 LAB — CULTURE, BLOOD (ROUTINE X 2)
Culture: NO GROWTH
Culture: NO GROWTH
Special Requests: ADEQUATE
Special Requests: ADEQUATE

## 2024-07-05 LAB — URINALYSIS, ROUTINE W REFLEX MICROSCOPIC
Bacteria, UA: NONE SEEN
Bilirubin Urine: NEGATIVE
Glucose, UA: >=500 mg/dL — AB
Hgb urine dipstick: NEGATIVE
Ketones, ur: 5 mg/dL — AB
Leukocytes,Ua: NEGATIVE
Nitrite: NEGATIVE
Protein, ur: NEGATIVE mg/dL
Specific Gravity, Urine: 1.028 (ref 1.005–1.030)
pH: 6 (ref 5.0–8.0)

## 2024-07-05 LAB — COMPREHENSIVE METABOLIC PANEL WITH GFR
ALT: 42 U/L (ref 0–44)
AST: 31 U/L (ref 15–41)
Albumin: 3.9 g/dL (ref 3.5–5.0)
Alkaline Phosphatase: 88 U/L (ref 38–126)
Anion gap: 16 — ABNORMAL HIGH (ref 5–15)
BUN: 10 mg/dL (ref 6–20)
CO2: 20 mmol/L — ABNORMAL LOW (ref 22–32)
Calcium: 9.1 mg/dL (ref 8.9–10.3)
Chloride: 98 mmol/L (ref 98–111)
Creatinine, Ser: 0.86 mg/dL (ref 0.61–1.24)
GFR, Estimated: >60 mL/min
Glucose, Bld: 294 mg/dL — ABNORMAL HIGH (ref 70–99)
Potassium: 4.2 mmol/L (ref 3.5–5.1)
Sodium: 134 mmol/L — ABNORMAL LOW (ref 135–145)
Total Bilirubin: 0.5 mg/dL (ref 0.0–1.2)
Total Protein: 7 g/dL (ref 6.5–8.1)

## 2024-07-05 LAB — BLOOD GAS, VENOUS
Acid-Base Excess: 4 mmol/L — ABNORMAL HIGH (ref 0.0–2.0)
Bicarbonate: 29.7 mmol/L — ABNORMAL HIGH (ref 20.0–28.0)
Drawn by: 9114
O2 Saturation: 34.5 %
Patient temperature: 36.6
pCO2, Ven: 47 mmHg (ref 44–60)
pH, Ven: 7.41 (ref 7.25–7.43)
pO2, Ven: <31 mmHg — CL (ref 32–45)

## 2024-07-05 LAB — RESP PANEL BY RT-PCR (RSV, FLU A&B, COVID)  RVPGX2
Influenza A by PCR: NEGATIVE
Influenza B by PCR: NEGATIVE
Resp Syncytial Virus by PCR: NEGATIVE
SARS Coronavirus 2 by RT PCR: NEGATIVE

## 2024-07-05 LAB — TROPONIN T, HIGH SENSITIVITY
Troponin T High Sensitivity: 13 ng/L (ref 0–19)
Troponin T High Sensitivity: 12 ng/L (ref 0–19)

## 2024-07-05 MED ORDER — LACTATED RINGERS IV BOLUS
1000.0000 mL | Freq: Once | INTRAVENOUS | Status: AC
  Administered 2024-07-05: 1000 mL via INTRAVENOUS

## 2024-07-05 MED ORDER — SODIUM CHLORIDE 0.9 % IV BOLUS
1000.0000 mL | Freq: Once | INTRAVENOUS | Status: AC
  Administered 2024-07-05: 1000 mL via INTRAVENOUS

## 2024-07-05 NOTE — Discharge Instructions (Addendum)
 Make sure you monitor  your blood sugar and follow closely with your primary care provider.  Your blood sugar was elevated today, which can cause you to feel weak and can cause increased urination.  However, if you develop chest pain, shortness of breath, high fever, vomiting, or any other new/concerning symptoms then return to the ER.

## 2024-07-05 NOTE — ED Provider Notes (Signed)
 " [place] EMERGENCY DEPARTMENT AT [facility] Provider Note   CSN: [id number] Arrival date & time: 07/05/24  9263     Patient presents with: Fatigue   Matthew Pennington is a 59 y.o. male.   HPI 59 year old male presents with a chief complaint of feeling weak and fatigued.  He has been feeling this way for almost a week.  He has had some generalized bodyaches, increased urinary frequency from baseline without dysuria or hematuria, and generalized fatigue.  He was here 4 days ago and his symptoms were attributed to a kidney stone.  He did pass the kidney stone in between then and now.  However he still has generalized bodyaches, weakness/fatigue, and now he has also developed a little cough and some chest tightness with mild dyspnea since yesterday.  He does not think he is had a fever but he does have some sweating.  He denies any focal weakness or incontinence.  He does have some low back pain but that goes along with everything else.  No abdominal pain or vomiting.  He did recently take care of a granddaughter who had some sort of viral syndrome.  Prior to Admission medications  Medication Sig Start Date End Date Taking? Authorizing Provider  albuterol  (VENTOLIN  HFA) 108 (90 Base) MCG/ACT inhaler Inhale 2 puffs into the lungs every 6 (six) hours as needed (cough). 12/21/23   Severa Rock HERO, FNP  baclofen  (LIORESAL ) 10 MG tablet Take 1 tablet (10 mg total) by mouth 3 (three) times daily. 04/07/23   Vivienne Delon HERO, PA-C  Blood Glucose Monitoring Suppl (ONETOUCH VERIO REFLECT) w/Device KIT  02/27/20   [provider]  diclofenac  Sodium (VOLTAREN ) 1 % GEL Apply 4 g topically 4 (four) times daily. 11/07/23   Lavell Lye A, FNP  DULoxetine  (CYMBALTA ) 30 MG capsule Take 1 capsule (30 mg total) by mouth daily. 02/08/24   Lavell Lye LABOR, FNP  empagliflozin  (JARDIANCE ) 25 MG TABS tablet Take 1 tablet (25 mg total) by mouth daily before breakfast. 02/08/24   Lavell Lye LABOR, FNP   enalapril  (VASOTEC ) 20 MG tablet Take 1 tablet by mouth once daily 01/18/24   Lavell Lye A, FNP  ezetimibe  (ZETIA ) 10 MG tablet Take 1 tablet (10 mg total) by mouth daily. 02/08/24   Lavell Lye LABOR, FNP  fluticasone  (FLONASE ) 50 MCG/ACT nasal spray Place 2 sprays into both nostrils daily. 04/29/24   Leath-Warren, Etta PARAS, NP  gabapentin  (NEURONTIN ) 300 MG capsule Take 1 capsule (300 mg total) by mouth 3 (three) times daily. 02/08/24   Lavell Lye A, FNP  glucose blood (ONETOUCH VERIO) test strip Use as instructed to test daily; DX:e11.65 09/21/23   Lavell Lye LABOR, FNP  ibuprofen  (ADVIL ) 800 MG tablet Take 1 tablet (800 mg total) by mouth every 8 (eight) hours as needed. 02/08/24   Lavell Lye LABOR, FNP  insulin degludec  (TRESIBA  FLEXTOUCH) 200 UNIT/ML FlexTouch Pen Inject 6 Units into the skin daily at 10 pm. 05/10/24   Lavell Lye LABOR, FNP  Lancet Devices (SIMPLE DIAGNOSTICS LANCING DEV) MISC Apply topically. 07/26/21   [provider]  levocetirizine (XYZAL ) 5 MG tablet Take 1 tablet (5 mg total) by mouth every evening. 12/21/23   Rakes, Rock HERO, FNP  lidocaine  (LIDODERM ) 5 % Place 1 patch onto the skin daily. Remove & Discard patch within 12 hours or as directed by MD 04/04/23   Kommor, Lum, MD  metFORMIN  (GLUCOPHAGE -XR) 500 MG 24 hr tablet Take 2 tablets (1,000 mg total)  by mouth 2 (two) times daily with a meal. 02/21/24   Hawks, Bari A, FNP  oxcarbazepine  (TRILEPTAL ) 600 MG tablet Take 1 tablet (600 mg total) by mouth 3 (three) times daily. 11/07/23   Lavell Bari LABOR, FNP  pantoprazole  (PROTONIX ) 40 MG tablet Take 1 tablet (40 mg total) by mouth daily. 02/08/24   Lavell Bari A, FNP  rOPINIRole  (REQUIP ) 0.25 MG tablet TAKE 1 TO 2 TABLETS BY MOUTH AT BEDTIME FOR  RESTLESS  LEGS 02/08/24   Hawks, Bari A, FNP  rosuvastatin  (CRESTOR ) 5 MG tablet Take 1 tablet (5 mg total) by mouth 3 (three) times a week. 05/01/24   Lavell Bari LABOR, FNP  traMADol  (ULTRAM ) 50 MG tablet Take 1  tablet (50 mg total) by mouth daily. 05/09/24   Lavell Bari LABOR, FNP    Allergies: Farxiga  [dapagliflozin ], Morphine and codeine, Ozempic  (0.25 or 0.5 mg-dose) [semaglutide (0.25 or 0.5mg -dos)], Semaglutide , and Statins    Review of Systems  Constitutional:  Positive for fatigue. Negative for fever.  HENT:  Negative for sore throat.   Respiratory:  Positive for cough, chest tightness and shortness of breath.   Gastrointestinal:  Negative for abdominal pain and vomiting.  Genitourinary:  Positive for frequency. Negative for dysuria and hematuria.    Updated Vital Signs BP (!) 119/38   Pulse 85   Temp 97.9 F (36.6 C) (Oral)   Resp 20   Ht 6' 1 (1.854 m)   Wt 122 kg   SpO2 96%   BMI 35.49 kg/m   Physical Exam Vitals and nursing note reviewed.  Constitutional:      General: He is not in acute distress.    Appearance: He is well-developed. He is not ill-appearing or diaphoretic.  HENT:     Head: Normocephalic and atraumatic.  Cardiovascular:     Rate and Rhythm: Normal rate and regular rhythm.     Heart sounds: Normal heart sounds.  Pulmonary:     Effort: Pulmonary effort is normal.     Breath sounds: Normal breath sounds.  Abdominal:     Palpations: Abdomen is soft.     Tenderness: There is no abdominal tenderness.  Musculoskeletal:     Cervical back: No rigidity.  Skin:    General: Skin is warm and dry.  Neurological:     Mental Status: He is alert.     Comments: 5/5 strength in BLE. Grossly normal sensation     (all labs ordered are listed, but only abnormal results are displayed) Labs Reviewed  COMPREHENSIVE METABOLIC PANEL WITH GFR - Abnormal; Notable for the following components:      Result Value   Sodium 134 (*)    CO2 20 (*)    Glucose, Bld 294 (*)    Anion gap 16 (*)    All other components within normal limits  URINALYSIS, ROUTINE W REFLEX MICROSCOPIC - Abnormal; Notable for the following components:   Color, Urine STRAW (*)    Glucose, UA >=500  (*)    Ketones, ur 5 (*)    All other components within normal limits  CBC WITH DIFFERENTIAL/PLATELET - Abnormal; Notable for the following components:   Lymphs Abs 5.2 (*)    All other components within normal limits  BLOOD GAS, VENOUS - Abnormal; Notable for the following components:   pO2, Ven <31 (*)    Bicarbonate 29.7 (*)    Acid-Base Excess 4.0 (*)    All other components within normal limits  RESP PANEL BY RT-PCR (RSV, FLU  A&B, COVID)  RVPGX2  CK  TROPONIN T, HIGH SENSITIVITY  TROPONIN T, HIGH SENSITIVITY    EKG: EKG Interpretation Date/Time:  Friday July 05 2024 09:16:26 EST Ventricular Rate:  103 PR Interval:  162 QRS Duration:  97 QT Interval:  325 QTC Calculation: 426 R Axis:   53  Text Interpretation: Sinus tachycardia Low voltage, extremity leads HR faster, otherwise no acute ST/T changes Confirmed by Freddi Hamilton [phone]) on 07/05/2024 9:38:46 AM  Radiology: [facility] Chest 2 View Result Date: 07/05/2024 EXAM: 2 VIEW(S) XRAY OF THE CHEST 07/05/2024 09:03:00 AM COMPARISON: 07/01/2024 CLINICAL HISTORY: Chest pain. FINDINGS: LUNGS AND PLEURA: Minimal elevation of the right hemidiaphragm, unchanged. Sign No focal pulmonary opacity. No pleural effusion. No pneumothorax. HEART AND MEDIASTINUM: No acute abnormality of the cardiac and mediastinal silhouettes. BONES AND SOFT TISSUES: Thoracic degenerative changes. IMPRESSION: 1. No acute cardiopulmonary abnormality. Electronically signed by: Rogelia Myers MD 07/05/2024 09:40 AM EST RP Workstation: [id number]     Procedures   Medications Ordered in the ED  sodium chloride  0.9 % bolus 1,000 mL (0 mLs Intravenous Stopped 07/05/24 1125)  lactated ringers  bolus 1,000 mL (0 mLs Intravenous Stopped 07/05/24 1230)                                    Medical Decision Making Amount and/or Complexity of Data Reviewed Labs: ordered.    Details: Hyperglycemia Radiology: ordered and independent interpretation performed.    Details:  No pneumonia ECG/medicine tests: ordered and independent interpretation performed.    Details: No acute ischemia   Patient is overall well-appearing.  Arrives mildly tachycardic though this improved with fluids.  He has some vague chest tightness but no pleuritic chest pain, exertional dyspnea, etc.  My suspicion for ACS, PE, etc. is quite low.  Sounds more like he has a viral syndrome with some sweating, body aches, recent sick contacts, etc.  I think also with his increased urination, he could be experiencing some dehydration and poor feeling from his hyperglycemia.  He has a mildly increased anion gap but he has a chronically low bicarbonate that is stable and he does not have any acidosis on his VBG or significant ketonuria on his urine.  I do not think this represents DKA.  He is now feeling much better after getting fluids.  I think he is stable for discharge home with supportive care and to close or monitor his glucose.  Discharged home with return precautions.     Final diagnoses:  Hyperglycemia    ED Discharge Orders     None          Freddi Hamilton, MD 07/05/24 [phone]  "

## 2024-07-05 NOTE — ED Triage Notes (Signed)
 Pt c/o of  lower back pain that hasn't gotten better since his last visit. Hx of kidney stones. Also c/o of feeling fatigued x 1 week.

## 2024-07-05 NOTE — ED Notes (Signed)
Sandwhich and drink given to pt

## 2024-07-05 NOTE — ED Notes (Signed)
 Pt allowed to eat.

## 2024-07-17 ENCOUNTER — Ambulatory Visit: Admitting: Internal Medicine

## 2024-08-22 ENCOUNTER — Encounter
